# Patient Record
Sex: Male | Born: 1935
Health system: Southern US, Community
[De-identification: ages and names within clinical notes are randomized; demographics above are authoritative.]

## PROBLEM LIST (undated history)

## (undated) DIAGNOSIS — M1990 Unspecified osteoarthritis, unspecified site: Secondary | ICD-10-CM

## (undated) DIAGNOSIS — F32A Depression, unspecified: Secondary | ICD-10-CM

## (undated) DIAGNOSIS — C801 Malignant (primary) neoplasm, unspecified: Secondary | ICD-10-CM

## (undated) DIAGNOSIS — J189 Pneumonia, unspecified organism: Secondary | ICD-10-CM

## (undated) DIAGNOSIS — E785 Hyperlipidemia, unspecified: Secondary | ICD-10-CM

## (undated) DIAGNOSIS — R7303 Prediabetes: Secondary | ICD-10-CM

## (undated) DIAGNOSIS — D649 Anemia, unspecified: Secondary | ICD-10-CM

## (undated) DIAGNOSIS — F419 Anxiety disorder, unspecified: Secondary | ICD-10-CM

## (undated) HISTORY — PX: SHOULDER SURGERY: SHX246

## (undated) HISTORY — PX: COLONOSCOPY: SHX174

## (undated) HISTORY — PX: WISDOM TOOTH EXTRACTION: SHX21

## (undated) HISTORY — PX: ELBOW SURGERY: SHX618

## (undated) HISTORY — PX: EYE SURGERY: SHX253

---

## 1999-09-20 ENCOUNTER — Emergency Department (HOSPITAL_COMMUNITY): Admission: EM | Admit: 1999-09-20 | Discharge: 1999-09-20 | Payer: Self-pay | Admitting: *Deleted

## 2001-04-03 ENCOUNTER — Ambulatory Visit (HOSPITAL_BASED_OUTPATIENT_CLINIC_OR_DEPARTMENT_OTHER): Admission: RE | Admit: 2001-04-03 | Discharge: 2001-04-03 | Payer: Self-pay | Admitting: Orthopedic Surgery

## 2002-06-17 ENCOUNTER — Encounter: Admission: RE | Admit: 2002-06-17 | Discharge: 2002-06-17 | Payer: Self-pay | Admitting: Orthopaedic Surgery

## 2002-06-17 ENCOUNTER — Encounter: Payer: Self-pay | Admitting: Orthopaedic Surgery

## 2003-05-21 ENCOUNTER — Ambulatory Visit (HOSPITAL_COMMUNITY): Admission: RE | Admit: 2003-05-21 | Discharge: 2003-05-21 | Payer: Self-pay | Admitting: *Deleted

## 2003-12-10 ENCOUNTER — Ambulatory Visit (HOSPITAL_COMMUNITY): Admission: RE | Admit: 2003-12-10 | Discharge: 2003-12-10 | Payer: Self-pay | Admitting: Family Medicine

## 2004-04-01 ENCOUNTER — Encounter: Admission: RE | Admit: 2004-04-01 | Discharge: 2004-06-30 | Payer: Self-pay | Admitting: Family Medicine

## 2004-09-12 ENCOUNTER — Emergency Department (HOSPITAL_COMMUNITY): Admission: EM | Admit: 2004-09-12 | Discharge: 2004-09-12 | Payer: Self-pay | Admitting: Emergency Medicine

## 2005-07-11 ENCOUNTER — Encounter: Admission: RE | Admit: 2005-07-11 | Discharge: 2005-07-11 | Payer: Self-pay | Admitting: Family Medicine

## 2005-07-15 ENCOUNTER — Encounter: Admission: RE | Admit: 2005-07-15 | Discharge: 2005-07-15 | Payer: Self-pay | Admitting: Family Medicine

## 2005-07-18 ENCOUNTER — Encounter: Admission: RE | Admit: 2005-07-18 | Discharge: 2005-07-18 | Payer: Self-pay | Admitting: Family Medicine

## 2007-05-08 IMAGING — CR DG THORACIC SPINE 3V
3 series · 3 of 3 positions shown · non-contrast
Comparison: none

CLINICAL DATA: Pain.
 CHEST, TWO VIEWS:
 The lungs are clear.  The heart and mediastinal structures have a normal appearance with diffuse mid and lower thoracic osteophytic changes.

[t t-spine a.p.]
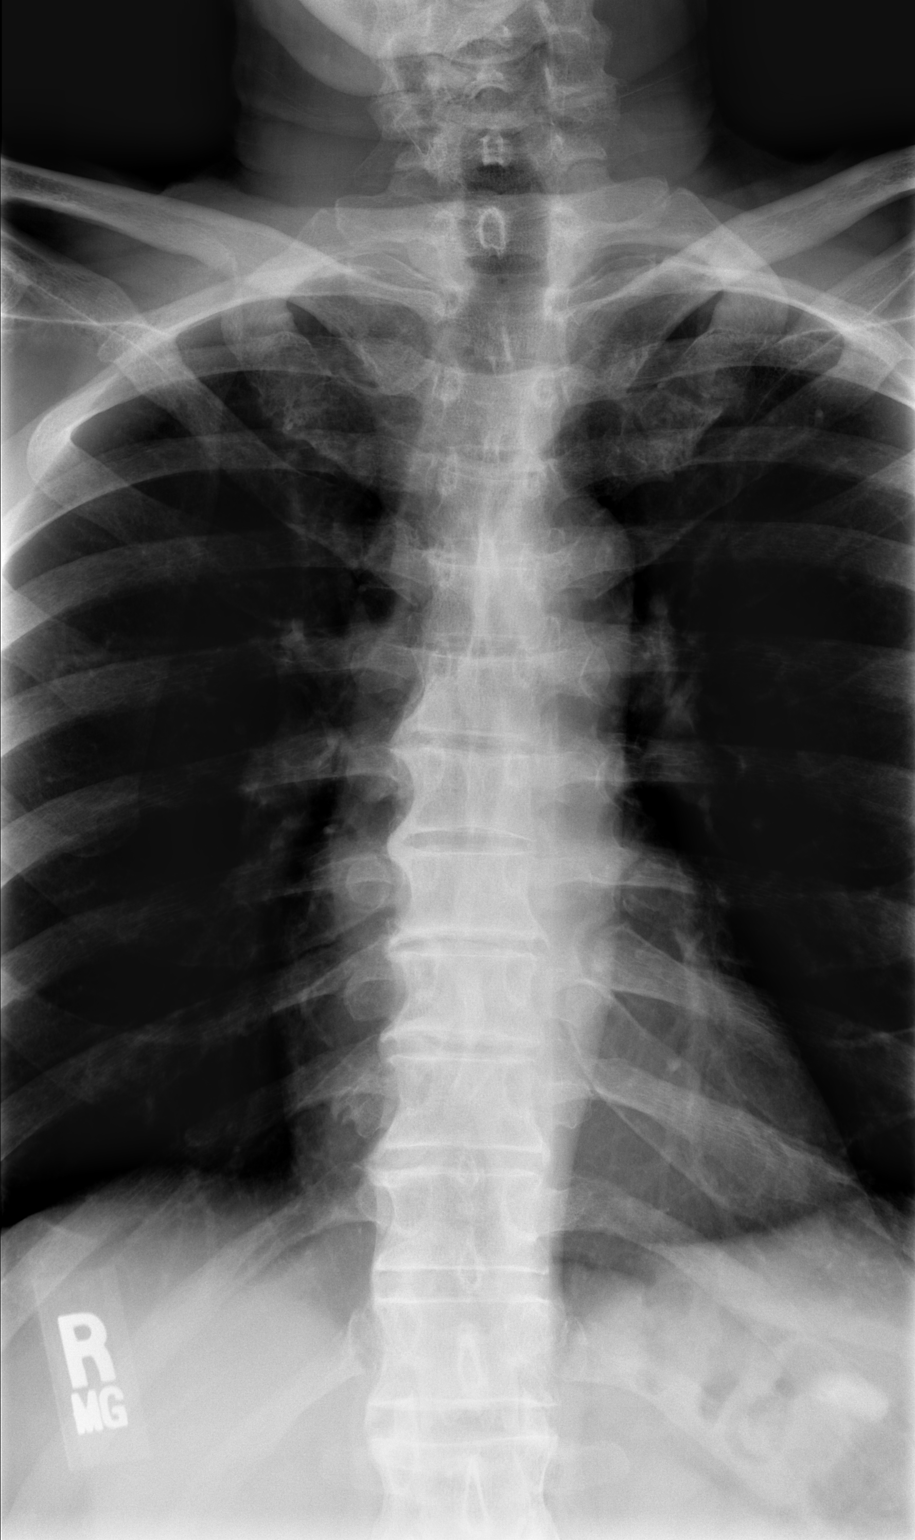

[t t-spine lat]
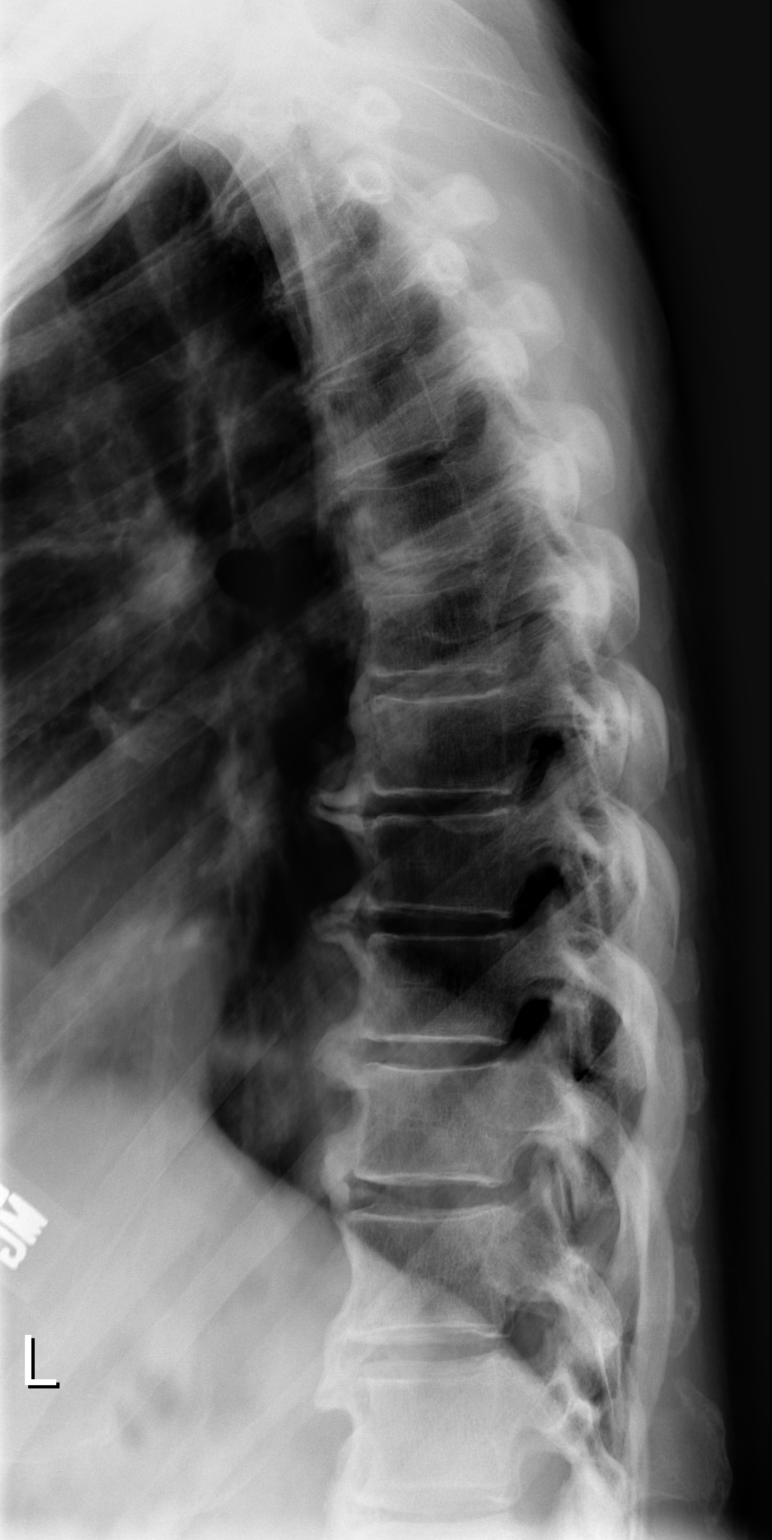

[t swimmers]
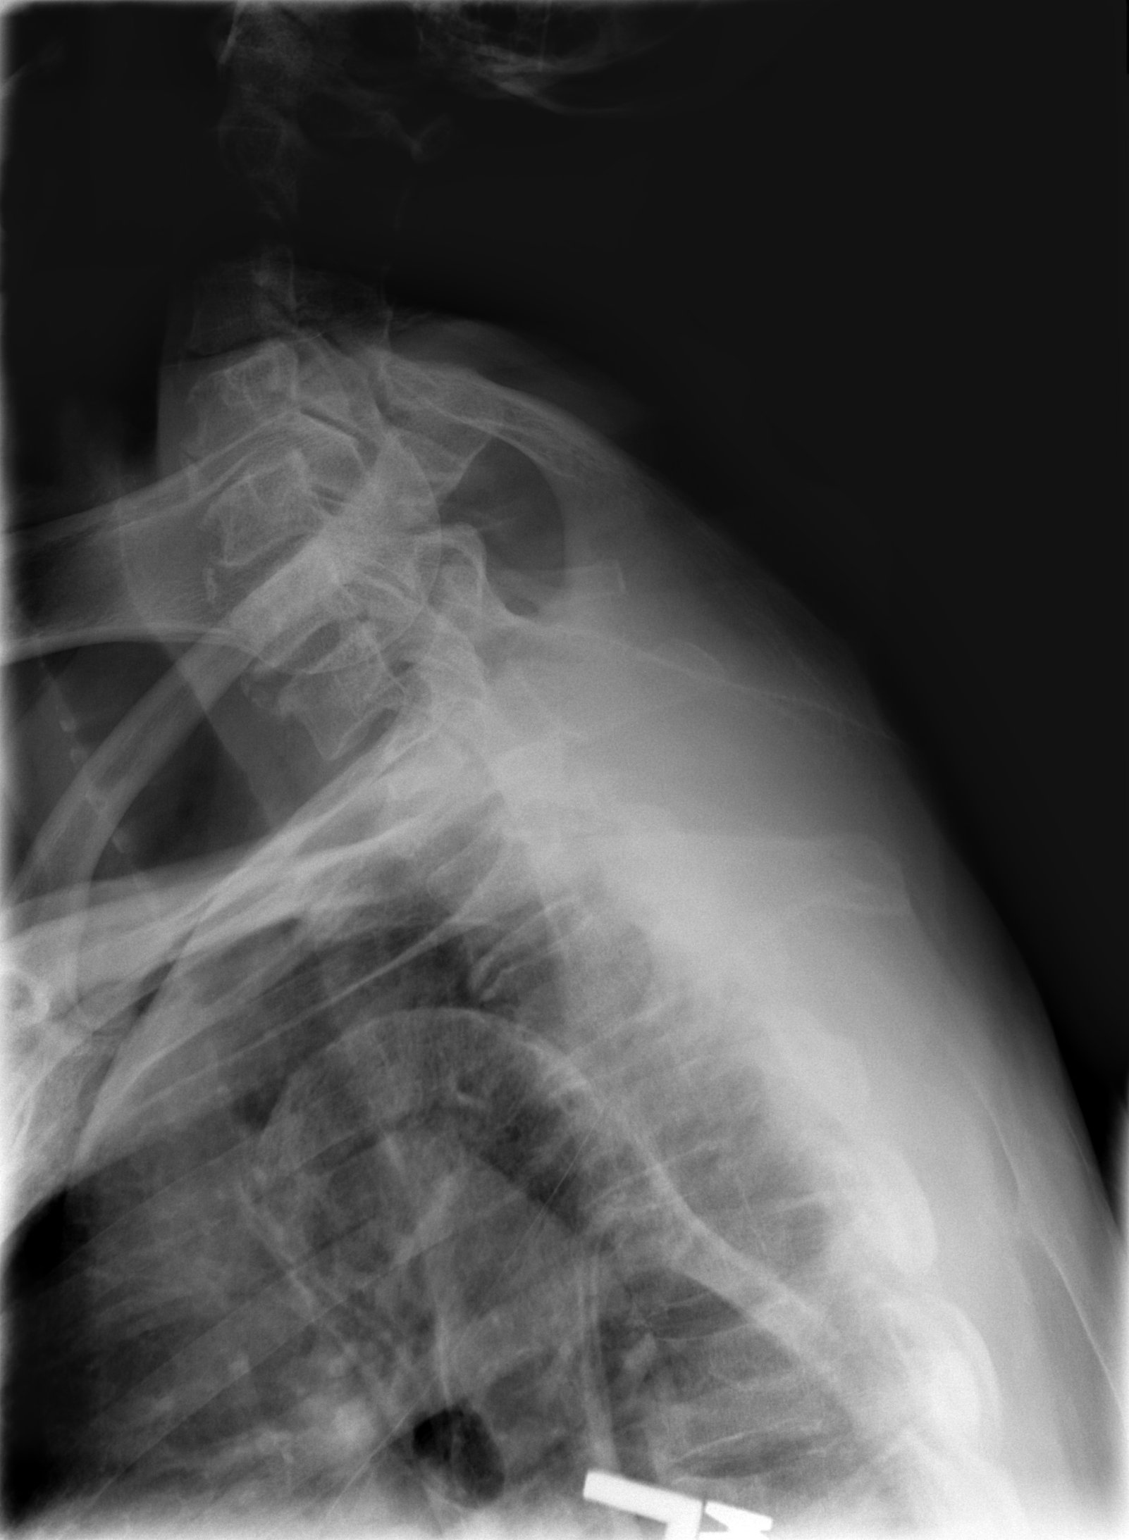

[3 of 3 positions shown; findings below may reference images not displayed]

IMPRESSION: Thoracic osteophytic changes.  No evidence for active chest disease radiographically.
 RIGHT RIBS, TWO VIEWS:
 There are thoracic osteophytic changes present.  There are no fractures or lytic lesions. The bones appear intrinsically normal.  There is a radiopaque density within the left upper quadrant.  This may represent ingested radiopaque material.
IMPRESSION: Normal views of right ribs.  Radiopaque density, left upper quadrant, which may represent ingested radiopaque tablet/material.  
 THORACIC SPINE, THREE VIEWS:
 There are osteophytic changes noted with in the mid and lower thoracic spine.   There no fractures, subluxations, or destructive changes. The paraspinal soft tissues have normal appearance.
IMPRESSION: Thoracic osteophytosis.  Otherwise normal study.

## 2014-06-04 ENCOUNTER — Ambulatory Visit: Payer: Self-pay | Admitting: *Deleted

## 2014-06-07 ENCOUNTER — Encounter (HOSPITAL_COMMUNITY): Payer: Self-pay | Admitting: Emergency Medicine

## 2014-06-07 ENCOUNTER — Ambulatory Visit (HOSPITAL_COMMUNITY)
Admission: AD | Admit: 2014-06-07 | Discharge: 2014-06-07 | Disposition: A | Discharge status: Home / Self Care | Payer: No Typology Code available for payment source | Attending: Psychiatry | Admitting: Psychiatry

## 2014-06-07 ENCOUNTER — Emergency Department (HOSPITAL_COMMUNITY)
Admission: EM | Admit: 2014-06-07 | Discharge: 2014-06-07 | Disposition: A | Discharge status: Home / Self Care | Payer: Medicare Other | Attending: Emergency Medicine | Admitting: Emergency Medicine

## 2014-06-07 DIAGNOSIS — F329 Major depressive disorder, single episode, unspecified: Secondary | ICD-10-CM | POA: Diagnosis not present

## 2014-06-07 DIAGNOSIS — F411 Generalized anxiety disorder: Secondary | ICD-10-CM | POA: Diagnosis not present

## 2014-06-07 DIAGNOSIS — F419 Anxiety disorder, unspecified: Secondary | ICD-10-CM | POA: Diagnosis not present

## 2014-06-07 DIAGNOSIS — F4321 Adjustment disorder with depressed mood: Secondary | ICD-10-CM | POA: Diagnosis present

## 2014-06-07 DIAGNOSIS — G4700 Insomnia, unspecified: Secondary | ICD-10-CM

## 2014-06-07 MED ORDER — AMITRIPTYLINE HCL 50 MG PO TABS: 50.0000 mg | ORAL_TABLET | Freq: Every day | ORAL | Status: DC

## 2014-06-07 MED ORDER — CLONAZEPAM 0.5 MG PO TABS: 0.5000 mg | ORAL_TABLET | Freq: Three times a day (TID) | ORAL | Status: DC | PRN

## 2014-06-07 MED ORDER — AMITRIPTYLINE HCL 25 MG PO TABS: 50.0000 mg | ORAL_TABLET | Freq: Every day | ORAL | Status: DC

## 2014-06-07 NOTE — Consult Note (Signed)
[facility]   Reason for Consult:  Anxiety Referring Physician:  EDP Patient Identification: Jeremy Holmes MRN:  [id number] Principal Diagnosis: Generalized anxiety disorder Diagnosis:   Patient Active Problem List   Diagnosis Date Noted  . Generalized anxiety disorder [F41.1] 06/07/2014    Priority: High  . Adjustment disorder with depressed mood [F43.21] 06/07/2014    Priority: High  Review of Systems  Constitutional: Negative.   HENT: Negative.   Eyes: Negative.   Respiratory: Negative.   Cardiovascular: Negative.   Gastrointestinal: Negative.   Genitourinary: Negative.   Musculoskeletal: Negative.   Skin: Negative.   Neurological: Negative.   Endo/Heme/Allergies: Negative.   Psychiatric/Behavioral: The patient is nervous/anxious.    HPI:  The patient has had a stressful few months with his grandson and his drug issues.  His anxiety started and increased with lack of sleep which has decreased to an hour a night.  Mr. Jeremy Holmes has lost weight with a decrease in appetite.  His regular MD gave him Xanax 0.5 mg daily for ten days in February, doubled the dose on 2/19, and started Zoloft on 2/24 but he stopped after a week due to "bad thoughts, very dark thoughts".  Last night, he was prescribed Temazepam which made things worse.  [facility] lack of sleep and anxiety, transferred to the ED.  His wife is supportive and at his bedside.  Mr. Jeremy Holmes has been going to a counselor at [facility].  He has an appointment with the NP this Wednesday.  Psychiatrist Rx Klonopin 0.5 mg TID for anxiety (1 week Rx) and Amitriptyline 50 mg at bedtime for sleep.  Denies suicidal/homicidal ideations, hallucinations, and alcohol/drug issues.  Stable for discharge.  Total Time spent with patient: 45 minutes  Subjective:   Jeremy Holmes is a 81 y.o. male patient does not warrant admission.  HPI:  The patient HPI Elements:   Location:   generalized. Quality:  acute. Severity:  mild. Timing:  constant. Duration:  month ago. Context:  stressors.  Past Medical History: History reviewed. No pertinent past medical history. History reviewed. No pertinent past surgical history. Family History: History reviewed. No pertinent family history. Social History:  History  Alcohol Use No     History  Drug Use No    History   Social History  . Marital Status: Married    Spouse Name: N/A  . Number of Children: N/A  . Years of Education: N/A   Social History Main Topics  . Smoking status: Never Smoker   . Smokeless tobacco: Not on file  . Alcohol Use: No  . Drug Use: No  . Sexual Activity: Not on file   Other Topics Concern  . None   Social History Narrative  . None   Additional Social History:                          Allergies:  No Known Allergies  Vitals: Blood pressure 160/59, pulse 66, temperature 98.1 F (36.7 C), temperature source Oral, resp. rate 17, SpO2 96 %.  Risk to Self:   Risk to Others:   Prior Inpatient Therapy:   Prior Outpatient Therapy:    Current Facility-Administered Medications  Medication Dose Route Frequency Provider Last Rate Last Dose  . amitriptyline (ELAVIL) tablet 50 mg  50 mg Oral QHS Jeremy RingsJamison Y Mafalda Mcginniss, NP       Current Outpatient Prescriptions  Medication Sig  Dispense Refill  . amitriptyline (ELAVIL) 50 MG tablet Take 1 tablet (50 mg total) by mouth at bedtime. 30 tablet 0  . clonazePAM (KLONOPIN) 0.5 MG tablet Take 1 tablet (0.5 mg total) by mouth 3 (three) times daily as needed for anxiety. 21 tablet 0    Musculoskeletal: Strength & Muscle Tone: within normal limits Gait & Station: normal Patient leans: N/A  Psychiatric Specialty Exam:     Blood pressure 160/59, pulse 66, temperature 98.1 F (36.7 C), temperature source Oral, resp. rate 17, SpO2 96 %.There is no height or weight on file to calculate BMI.  General Appearance: Casual  Eye Contact::  Good   Speech:  Normal Rate  Volume:  Normal  Mood:  Anxious  Affect:  Congruent  Thought Process:  Coherent  Orientation:  Full (Time, Place, and Person)  Thought Content:  WDL  Suicidal Thoughts:  No  Homicidal Thoughts:  No  Memory:  Immediate;   Good Recent;   Good Remote;   Good  Judgement:  Good  Insight:  Good  Psychomotor Activity:  Normal  Concentration:  Good  Recall:  Good  Fund of Knowledge:Good  Language: Good  Akathisia:  No  Handed:  Right  AIMS (if indicated):     Assets:  Communication Skills Desire for Improvement Financial Resources/Insurance Housing Intimacy Leisure Time Physical Health Resilience Social Support Talents/Skills Transportation  ADL's:  Intact  Cognition: WNL  Sleep:      Medical Decision Making: Review of Psycho-Social Stressors (1), Review or order clinical lab tests (1) and Review of Medication Regimen & Side Effects (2)  Treatment Plan Summary: Daily contact with patient to assess and evaluate symptoms and progress in treatment, Medication management and Plan discharge, appointment Wednesday for out-patient, Rx given for anxiety and sleep  Plan:  No evidence of imminent risk to self or others at present.   Disposition: Discharge, appointment Wednesday for out-patient, Rx given for anxiety and sleep  Jeremy Holmes, PMH-NP 06/07/2014 11:29 AM

## 2014-06-07 NOTE — ED Provider Notes (Signed)
CSN: [id number]     Arrival date & time 06/07/14  [id number] History   First MD Initiated Contact with Patient 06/07/14 [phone]     Chief Complaint  Patient presents with  . Anxiety     (Consider location/radiation/quality/duration/timing/severity/associated sxs/prior Treatment) HPI Comments: Here for anxiety. About one month ago had some stressful family events where his grandson was in a rage due to drug use. Patient was unable to help after going to his grandson's house. He felt a lot of guilt and got establishing him. He's been unable to sleep since then because he has been bothered by these events. Patient was given some sertraline by his doctor about 3 weeks ago. While he was taking it, he was having some suicidal thoughts. He stopped it after 3 days because of the suicidal thoughts. He's had no other suicidal thoughts since then. He is not suicidal today. He is having a lot of difficulty sleeping. He was taking some Xanax as needed and then was prescribed yesterday 3 times a day Xanax for anxiety. He is mainly concerned because he's been unable to sleep. He's had no relief with melatonin or temazepam.  Patient is a 81 y.o. male presenting with anxiety. The history is provided by the patient.  Anxiety This is a new problem. The current episode started more than 1 week ago. The problem occurs constantly. The problem has not changed since onset.Pertinent negatives include no chest pain and no shortness of breath. Nothing aggravates the symptoms. Relieved by: Low-dose benzos.    History reviewed. No pertinent past medical history. History reviewed. No pertinent past surgical history. History reviewed. No pertinent family history. History  Substance Use Topics  . Smoking status: Never Smoker   . Smokeless tobacco: Not on file  . Alcohol Use: No    Review of Systems  Constitutional: Negative for fever and chills.  Respiratory: Negative for shortness of breath.   Cardiovascular: Negative for  chest pain.  Psychiatric/Behavioral: Positive for sleep disturbance. Negative for self-injury.  All other systems reviewed and are negative.     Allergies  Review of patient's allergies indicates no known allergies.  Home Medications   Prior to Admission medications   Not on File   BP 160/59 mmHg  Pulse 66  Temp(Src) 98.1 F (36.7 C) (Oral)  Resp 17  SpO2 96% Physical Exam  Constitutional: He is oriented to person, place, and time. He appears well-developed and well-nourished. No distress.  HENT:  Head: Normocephalic and atraumatic.  Mouth/Throat: No oropharyngeal exudate.  Eyes: EOM are normal. Pupils are equal, round, and reactive to light.  Neck: Normal range of motion. Neck supple.  Cardiovascular: Normal rate and regular rhythm.  Exam reveals no friction rub.   No murmur heard. Pulmonary/Chest: Effort normal and breath sounds normal. No respiratory distress. He has no wheezes. He has no rales.  Abdominal: He exhibits no distension. There is no tenderness. There is no rebound.  Musculoskeletal: Normal range of motion. He exhibits no edema.  Neurological: He is alert and oriented to person, place, and time.  Skin: He is not diaphoretic.  Nursing note and vitals reviewed.   ED Course  Procedures (including critical care time) Labs Review Labs Reviewed - No data to display  Imaging Review No results found.   EKG Interpretation None      MDM   Final diagnoses:  Anxiety  Insomnia    81 year old male here with anxiety and and insomnia. I feel he is having depression because of  his feelings of guilt and insomnia. He is not suicidal. He has not seen our talk to psychiatrist. He is not suicidal or homicidal today. Patient is visibly anxious on exam. He is mostly concerned about his insomnia. I counseled him on Benadryl use. He would really like to speak to psychiatrist. Last counseled him that he does not meet inpatient criteria, however I will consult psych to  see if there are any options they can help him with and for possible outpatient follow-up. Psych consulted on patient, prescribed some further medications. Stable for discharge.    Elwin Mocha, MD 06/08/14 [phone]

## 2014-06-07 NOTE — [facility] Suicide Risk Assessment (Signed)
Suicide Risk Assessment  Discharge Assessment   [facility]   Demographic Factors:  Male, Age 81 or older and Caucasian  Total Time spent with patient: 45 minutes  Musculoskeletal: Strength & Muscle Tone: within normal limits Gait & Station: normal Patient leans: N/A  Psychiatric Specialty Exam:     Blood pressure 160/59, pulse 66, temperature 98.1 F (36.7 C), temperature source Oral, resp. rate 17, SpO2 96 %.There is no height or weight on file to calculate BMI.  General Appearance: Casual  Eye Contact::  Good  Speech:  Normal Rate  Volume:  Normal  Mood:  Anxious  Affect:  Congruent  Thought Process:  Coherent  Orientation:  Full (Time, Place, and Person)  Thought Content:  WDL  Suicidal Thoughts:  No  Homicidal Thoughts:  No  Memory:  Immediate;   Good Recent;   Good Remote;   Good  Judgement:  Good  Insight:  Good  Psychomotor Activity:  Normal  Concentration:  Good  Recall:  Good  Fund of Knowledge:Good  Language: Good  Akathisia:  No  Handed:  Right  AIMS (if indicated):     Assets:  Communication Skills Desire for Improvement Financial Resources/Insurance Housing Intimacy Leisure Time Physical Health Resilience Social Support Talents/Skills Transportation  ADL's:  Intact  Cognition: WNL  Sleep:         Has this patient used any form of tobacco in the last 30 days? (Cigarettes, Smokeless Tobacco, Cigars, and/or Pipes) No  Mental Status Per Nursing Assessment::   On Admission:   Anxious, insomnia  Current Mental Status by Physician: NA  Loss Factors: NA  Historical Factors: NA  Risk Reduction Factors:   Sense of responsibility to family, Religious beliefs about death, Living with another person, especially a relative, Positive social support, Positive therapeutic relationship and Positive coping skills or problem solving skills  Continued Clinical Symptoms:  Anxiety  Cognitive Features That Contribute To  Risk:  None    Suicide Risk:  Minimal: No identifiable suicidal ideation.  Patients presenting with no risk factors but with morbid ruminations; may be classified as minimal risk based on the severity of the depressive symptoms  Principal Problem: Generalized anxiety disorder Discharge Diagnoses:  Patient Active Problem List   Diagnosis Date Noted  . Generalized anxiety disorder [F41.1] 06/07/2014    Priority: High  . Adjustment disorder with depressed mood [F43.21] 06/07/2014    Priority: High      Plan Of Care/Follow-up recommendations:  Activity:  as tolerated Diet:  heart healthy diet  Is patient on multiple antipsychotic therapies at discharge:  No   Has Patient had three or more failed trials of antipsychotic monotherapy by history:  No  Recommended Plan for Multiple Antipsychotic Therapies: NA    LORD, JAMISON, PMH-NP 06/07/2014, 12:08 PM

## 2014-06-07 NOTE — ED Notes (Signed)
Pt reports having problems with anxiety after a stressful family situation.  Went to his PCP who prescribed him xanax XR.  States that he was having trouble with insomnia, loss of appetite, depression.  A week later was put on sertraline.  After 3 doses of sertraline, pt began having suicidal thoughts.  Reports intermittent suicidal thoughts but denies them today.  Pt states that he wanted to get off these meds so his PCP put him on fast acting xanax and temazepam.  States that he does not feel any better.  Feels more anxious.  Reports last suicidal thoughts were in February.  Denies HI.  Pt states that his grandson is on drugs and caused him this stress.  States he feels helpless in this situation.  Feels like God is punishing him.

## 2014-06-07 NOTE — Discharge Instructions (Signed)
Insomnia Insomnia is frequent trouble falling and/or staying asleep. Insomnia can be a long term problem or a short term problem. Both are common. Insomnia can be a short term problem when the wakefulness is related to a certain stress or worry. Long term insomnia is often related to ongoing stress during waking hours and/or poor sleeping habits. Overtime, sleep deprivation itself can make the problem worse. Every little thing feels more severe because you are overtired and your ability to cope is decreased. CAUSES   Stress, anxiety, and depression.  Poor sleeping habits.  Distractions such as TV in the bedroom.  Naps close to bedtime.  Engaging in emotionally charged conversations before bed.  Technical reading before sleep.  Alcohol and other sedatives. They may make the problem worse. They can hurt normal sleep patterns and normal dream activity.  Stimulants such as caffeine for several hours prior to bedtime.  Pain syndromes and shortness of breath can cause insomnia.  Exercise late at night.  Changing time zones may cause sleeping problems (jet lag). It is sometimes helpful to have someone observe your sleeping patterns. They should look for periods of not breathing during the night (sleep apnea). They should also look to see how long those periods last. If you live alone or observers are uncertain, you can also be observed at a sleep clinic where your sleep patterns will be professionally monitored. Sleep apnea requires a checkup and treatment. Give your caregivers your medical history. Give your caregivers observations your family has made about your sleep.  SYMPTOMS   Not feeling rested in the morning.  Anxiety and restlessness at bedtime.  Difficulty falling and staying asleep. TREATMENT   Your caregiver may prescribe treatment for an underlying medical disorders. Your caregiver can give advice or help if you are using alcohol or other drugs for self-medication. Treatment  of underlying problems will usually eliminate insomnia problems.  Medications can be prescribed for short time use. They are generally not recommended for lengthy use.  Over-the-counter sleep medicines are not recommended for lengthy use. They can be habit forming.  You can promote easier sleeping by making lifestyle changes such as:  Using relaxation techniques that help with breathing and reduce muscle tension.  Exercising earlier in the day.  Changing your diet and the time of your last meal. No night time snacks.  Establish a regular time to go to bed.  Counseling can help with stressful problems and worry.  Soothing music and white noise may be helpful if there are background noises you cannot remove.  Stop tedious detailed work at least one hour before bedtime. HOME CARE INSTRUCTIONS   Keep a diary. Inform your caregiver about your progress. This includes any medication side effects. See your caregiver regularly. Take note of:  Times when you are asleep.  Times when you are awake during the night.  The quality of your sleep.  How you feel the next day. This information will help your caregiver care for you.  Get out of bed if you are still awake after 15 minutes. Read or do some quiet activity. Keep the lights down. Wait until you feel sleepy and go back to bed.  Keep regular sleeping and waking hours. Avoid naps.  Exercise regularly.  Avoid distractions at bedtime. Distractions include watching television or engaging in any intense or detailed activity like attempting to balance the household checkbook.  Develop a bedtime ritual. Keep a familiar routine of bathing, brushing your teeth, climbing into bed at the same   time each night, listening to soothing music. Routines increase the success of falling to sleep faster.  Use relaxation techniques. This can be using breathing and muscle tension release routines. It can also include visualizing peaceful scenes. You can  also help control troubling or intruding thoughts by keeping your mind occupied with boring or repetitive thoughts like the old concept of counting sheep. You can make it more creative like imagining planting one beautiful flower after another in your backyard garden.  During your day, work to eliminate stress. When this is not possible use some of the previous suggestions to help reduce the anxiety that accompanies stressful situations. MAKE SURE YOU:   Understand these instructions.  Will watch your condition.  Will get help right away if you are not doing well or get worse. Document Released: 03/11/2000 Document Revised: 06/06/2011 Document Reviewed: 04/11/2007 Exit[employer] Patient Information 2015 [employer]. This information is not intended to replace advice given to you by your health care provider. Make sure you discuss any questions you have with your health care provider.  

## 2014-06-07 NOTE — BH Assessment (Addendum)
Tele Assessment Note   Jeremy Holmes is an 81 y.o. male who came as a walk in to [facility] wanting to come off of Xanax, but saying he is unable to sleep.  Pt also has off and on SI with no plan or intent or mental health history.  Pt became anxious about a month ago when his grandson was going through some addiction problems and he was trying to help. He was unable to sleep, so his GP prescribed  Xanax, and he was still unable to sleep. Gp also prescribed Zoloft, which pt only took for 3 days because he was having "dark thoughts".  Pt was initially taking one .5 mg of Xanax 2x/day, and then yesterday, the GP took him down to one tab of .25 3x/day, and prescribed Temazepam for sleep. Pt said he did not sleep at all last night, and he feels like he is "going crazy when he can't sleep".  He denies HI, A/V hallucinations or history of violence.  He endorses symptoms of guilt re: his grandson's SA problems, and says he worries about him.  He reports that he believes his brother was an alcoholic and committed suicide in his 55's due to financial problems.  Pt lives with his wife who is very supportive, but who pt says is "very anti-medications".   Pt was calm and cooperative during assessment with depressed mood and anxious affect. He had normal movement, speech and thought process and there was no evidence of responding to internal stimuli.  He was dressed casually and well groomed.   Nanine Means recommends pt be transferred to [facility] to be seen by psychiatry as they round to determine final disposition.  Axis I: Anxiety Disorder NOS and Depressive Disorder NOS Axis II: Deferred Axis III: No past medical history on file. Axis IV: other psychosocial or environmental problems Axis V: 41-50 serious symptoms  Past Medical History: No past medical history on file.  No past surgical history on file.  Family History: No family history on file.  Social History:  has no tobacco, alcohol, and drug history on  file.  Additional Social History:  Alcohol / Drug Use Pain Medications: denies Prescriptions: denies Over the Counter: denies History of alcohol / drug use?: No history of alcohol / drug abuse Longest period of sobriety (when/how long):  (denies) Negative Consequences of Use:  (denies)  CIWA:   COWS:    PATIENT STRENGTHS: (choose at least two) Ability for insight Active sense of humor Average or above average intelligence Capable of independent living Communication skills Financial means General fund of knowledge Motivation for treatment/growth Physical Health Special hobby/interest Supportive family/friends Work skills  Allergies: Allergies not on file  Home Medications:  (Not in a hospital admission)  OB/GYN Status:  No LMP for male patient.  General Assessment Data Location of Assessment: [facility] Is this a Tele or Face-to-Face Assessment?: Face-to-Face Is this an Initial Assessment or a Re-assessment for this encounter?: Initial Assessment Living Arrangements: Spouse/significant other Can pt return to current living arrangement?: Yes Admission Status: Voluntary Is patient capable of signing voluntary admission?: Yes Transfer from: Home Referral Source: Self/Family/Friend  Medical Screening Exam [facility] Walk-in ONLY) Medical Exam completed: No Reason for MSE not completed:  (going to [facility] for psych rounding)  [facility] Crisis Care Plan Living Arrangements: Spouse/significant other Name of Psychiatrist:  (appt with Sherin Quarry, NP at [facility] Wednesday) Name of Therapist:  [facility]  Education Status Is patient currently in school?:  No  Risk to self with the past 6 months Suicidal Ideation: Yes-Currently Present (off and on) Suicidal Intent: No Is patient at risk for suicide?: No Suicidal Plan?: No Access to Means: No What has been your use of drugs/alcohol within the last 12 months?:  (denies) Previous  Attempts/Gestures: No Other Self Harm Risks:  (none known) Family Suicide History:  (brother committed suicide) Recent stressful life event(s):  (stress from grandson's addiction) Persecutory voices/beliefs?: No Depression: Yes Depression Symptoms: Insomnia, Fatigue, Guilt, Loss of interest in usual pleasures Substance abuse history and/or treatment for substance abuse?: No Suicide prevention information given to non-admitted patients: Not applicable  Risk to Others within the past 6 months Homicidal Ideation: No Thoughts of Harm to Others: No Current Homicidal Intent: No Current Homicidal Plan: No Access to Homicidal Means: No History of harm to others?: No Assessment of Violence: None Noted Does patient have access to weapons?: No Criminal Charges Pending?: No Does patient have a court date: No  Psychosis Hallucinations: None noted Delusions: None noted  Mental Status Report Appear/Hygiene: Improved Eye Contact: Good Motor Activity: Restlessness Speech: Logical/coherent Level of Consciousness: Alert Mood: Anxious, Depressed Affect: Anxious, Depressed Anxiety Level: Severe Thought Processes: Coherent, Relevant Judgement: Unimpaired Orientation: Person, Place, Time, Situation, Appropriate for developmental age Obsessive Compulsive Thoughts/Behaviors: None  Cognitive Functioning Concentration: Decreased Memory: Recent Intact, Remote Intact IQ: Above Average Insight: Good Impulse Control: Good Appetite: Fair Weight Loss: 10 Weight Gain: 0 Sleep: Decreased Total Hours of Sleep: 2 Vegetative Symptoms: None  ADLScreening [facility]) Patient's cognitive ability adequate to safely complete daily activities?: Yes Patient able to express need for assistance with ADLs?: Yes Independently performs ADLs?: Yes (appropriate for developmental age)  Prior Inpatient Therapy Prior Inpatient Therapy: No  Prior Outpatient Therapy Prior Outpatient Therapy:  Yes Prior Therapy Dates:  (past 2 weeks) Prior Therapy Facilty/Provider(s):  [facility]) Reason for Treatment:  (anxiety)  ADL Screening (condition at time of admission) Patient's cognitive ability adequate to safely complete daily activities?: Yes Is the patient deaf or have difficulty hearing?: No Does the patient have difficulty seeing, even when wearing glasses/contacts?: No Does the patient have difficulty concentrating, remembering, or making decisions?: No Patient able to express need for assistance with ADLs?: Yes Does the patient have difficulty dressing or bathing?: No Independently performs ADLs?: Yes (appropriate for developmental age) Does the patient have difficulty walking or climbing stairs?: No  Home Assistive Devices/Equipment Home Assistive Devices/Equipment: None    Abuse/Neglect Assessment (Assessment to be complete while patient is alone) Physical Abuse: Denies Verbal Abuse: Denies Sexual Abuse: Denies Exploitation of patient/patient's resources: Denies Self-Neglect: Denies          Additional Information 1:1 In Past 12 Months?: No CIRT Risk: No Elopement Risk: No Does patient have medical clearance?: No     Disposition:  Disposition Initial Assessment Completed for this Encounter: Yes Disposition of Patient: Other dispositions (transport to )  [facility],Jeremy Holmes 06/07/2014 8:48 AM

## 2015-05-14 DIAGNOSIS — H26491 Other secondary cataract, right eye: Secondary | ICD-10-CM | POA: Diagnosis not present

## 2015-05-14 DIAGNOSIS — H35372 Puckering of macula, left eye: Secondary | ICD-10-CM | POA: Diagnosis not present

## 2015-05-14 DIAGNOSIS — H43811 Vitreous degeneration, right eye: Secondary | ICD-10-CM | POA: Diagnosis not present

## 2015-05-14 DIAGNOSIS — H35371 Puckering of macula, right eye: Secondary | ICD-10-CM | POA: Diagnosis not present

## 2015-05-27 DIAGNOSIS — H35372 Puckering of macula, left eye: Secondary | ICD-10-CM | POA: Diagnosis not present

## 2015-05-27 DIAGNOSIS — H547 Unspecified visual loss: Secondary | ICD-10-CM | POA: Diagnosis not present

## 2015-06-04 DIAGNOSIS — Z09 Encounter for follow-up examination after completed treatment for conditions other than malignant neoplasm: Secondary | ICD-10-CM | POA: Diagnosis not present

## 2015-06-04 DIAGNOSIS — H35372 Puckering of macula, left eye: Secondary | ICD-10-CM | POA: Diagnosis not present

## 2015-06-04 DIAGNOSIS — H35371 Puckering of macula, right eye: Secondary | ICD-10-CM | POA: Diagnosis not present

## 2015-06-25 DIAGNOSIS — Z09 Encounter for follow-up examination after completed treatment for conditions other than malignant neoplasm: Secondary | ICD-10-CM | POA: Diagnosis not present

## 2015-06-25 DIAGNOSIS — H35372 Puckering of macula, left eye: Secondary | ICD-10-CM | POA: Diagnosis not present

## 2015-06-25 DIAGNOSIS — H35352 Cystoid macular degeneration, left eye: Secondary | ICD-10-CM | POA: Diagnosis not present

## 2015-07-16 DIAGNOSIS — H59032 Cystoid macular edema following cataract surgery, left eye: Secondary | ICD-10-CM | POA: Diagnosis not present

## 2015-07-16 DIAGNOSIS — H35352 Cystoid macular degeneration, left eye: Secondary | ICD-10-CM | POA: Diagnosis not present

## 2015-08-27 DIAGNOSIS — H59032 Cystoid macular edema following cataract surgery, left eye: Secondary | ICD-10-CM | POA: Diagnosis not present

## 2015-08-27 DIAGNOSIS — H35352 Cystoid macular degeneration, left eye: Secondary | ICD-10-CM | POA: Diagnosis not present

## 2015-10-07 DIAGNOSIS — H35352 Cystoid macular degeneration, left eye: Secondary | ICD-10-CM | POA: Diagnosis not present

## 2015-10-07 DIAGNOSIS — H26491 Other secondary cataract, right eye: Secondary | ICD-10-CM | POA: Diagnosis not present

## 2015-10-07 DIAGNOSIS — H59032 Cystoid macular edema following cataract surgery, left eye: Secondary | ICD-10-CM | POA: Diagnosis not present

## 2015-10-07 DIAGNOSIS — H35371 Puckering of macula, right eye: Secondary | ICD-10-CM | POA: Diagnosis not present

## 2015-11-10 DIAGNOSIS — E785 Hyperlipidemia, unspecified: Secondary | ICD-10-CM | POA: Diagnosis not present

## 2015-11-10 DIAGNOSIS — R739 Hyperglycemia, unspecified: Secondary | ICD-10-CM | POA: Diagnosis not present

## 2015-11-10 DIAGNOSIS — Z79899 Other long term (current) drug therapy: Secondary | ICD-10-CM | POA: Diagnosis not present

## 2015-11-10 DIAGNOSIS — K5900 Constipation, unspecified: Secondary | ICD-10-CM | POA: Diagnosis not present

## 2015-12-25 DIAGNOSIS — Z23 Encounter for immunization: Secondary | ICD-10-CM | POA: Diagnosis not present

## 2015-12-25 DIAGNOSIS — R194 Change in bowel habit: Secondary | ICD-10-CM | POA: Diagnosis not present

## 2016-01-05 DIAGNOSIS — H26491 Other secondary cataract, right eye: Secondary | ICD-10-CM | POA: Diagnosis not present

## 2016-01-05 DIAGNOSIS — H35352 Cystoid macular degeneration, left eye: Secondary | ICD-10-CM | POA: Diagnosis not present

## 2016-01-05 DIAGNOSIS — H59032 Cystoid macular edema following cataract surgery, left eye: Secondary | ICD-10-CM | POA: Diagnosis not present

## 2016-01-05 DIAGNOSIS — H35371 Puckering of macula, right eye: Secondary | ICD-10-CM | POA: Diagnosis not present

## 2016-01-12 DIAGNOSIS — E785 Hyperlipidemia, unspecified: Secondary | ICD-10-CM | POA: Diagnosis not present

## 2016-01-12 DIAGNOSIS — R739 Hyperglycemia, unspecified: Secondary | ICD-10-CM | POA: Diagnosis not present

## 2016-01-12 DIAGNOSIS — E119 Type 2 diabetes mellitus without complications: Secondary | ICD-10-CM | POA: Diagnosis not present

## 2016-01-12 DIAGNOSIS — Z79899 Other long term (current) drug therapy: Secondary | ICD-10-CM | POA: Diagnosis not present

## 2016-02-03 DIAGNOSIS — H35371 Puckering of macula, right eye: Secondary | ICD-10-CM | POA: Diagnosis not present

## 2016-02-11 DIAGNOSIS — H35371 Puckering of macula, right eye: Secondary | ICD-10-CM | POA: Diagnosis not present

## 2016-02-11 DIAGNOSIS — Z09 Encounter for follow-up examination after completed treatment for conditions other than malignant neoplasm: Secondary | ICD-10-CM | POA: Diagnosis not present

## 2016-02-15 DIAGNOSIS — L570 Actinic keratosis: Secondary | ICD-10-CM | POA: Diagnosis not present

## 2016-02-15 DIAGNOSIS — D225 Melanocytic nevi of trunk: Secondary | ICD-10-CM | POA: Diagnosis not present

## 2016-02-15 DIAGNOSIS — L814 Other melanin hyperpigmentation: Secondary | ICD-10-CM | POA: Diagnosis not present

## 2016-02-15 DIAGNOSIS — Z86018 Personal history of other benign neoplasm: Secondary | ICD-10-CM | POA: Diagnosis not present

## 2016-02-15 DIAGNOSIS — C4441 Basal cell carcinoma of skin of scalp and neck: Secondary | ICD-10-CM | POA: Diagnosis not present

## 2016-02-15 DIAGNOSIS — Z23 Encounter for immunization: Secondary | ICD-10-CM | POA: Diagnosis not present

## 2016-02-15 DIAGNOSIS — D1801 Hemangioma of skin and subcutaneous tissue: Secondary | ICD-10-CM | POA: Diagnosis not present

## 2016-02-15 DIAGNOSIS — C44519 Basal cell carcinoma of skin of other part of trunk: Secondary | ICD-10-CM | POA: Diagnosis not present

## 2016-02-15 DIAGNOSIS — D485 Neoplasm of uncertain behavior of skin: Secondary | ICD-10-CM | POA: Diagnosis not present

## 2016-02-15 DIAGNOSIS — Z85828 Personal history of other malignant neoplasm of skin: Secondary | ICD-10-CM | POA: Diagnosis not present

## 2016-02-15 DIAGNOSIS — L821 Other seborrheic keratosis: Secondary | ICD-10-CM | POA: Diagnosis not present

## 2016-03-08 DIAGNOSIS — C4441 Basal cell carcinoma of skin of scalp and neck: Secondary | ICD-10-CM | POA: Diagnosis not present

## 2016-03-08 DIAGNOSIS — C44519 Basal cell carcinoma of skin of other part of trunk: Secondary | ICD-10-CM | POA: Diagnosis not present

## 2016-03-10 DIAGNOSIS — H35352 Cystoid macular degeneration, left eye: Secondary | ICD-10-CM | POA: Diagnosis not present

## 2016-03-10 DIAGNOSIS — Z09 Encounter for follow-up examination after completed treatment for conditions other than malignant neoplasm: Secondary | ICD-10-CM | POA: Diagnosis not present

## 2016-03-10 DIAGNOSIS — H35371 Puckering of macula, right eye: Secondary | ICD-10-CM | POA: Diagnosis not present

## 2016-04-19 DIAGNOSIS — H35371 Puckering of macula, right eye: Secondary | ICD-10-CM | POA: Diagnosis not present

## 2016-04-19 DIAGNOSIS — Z09 Encounter for follow-up examination after completed treatment for conditions other than malignant neoplasm: Secondary | ICD-10-CM | POA: Diagnosis not present

## 2016-05-10 DIAGNOSIS — H26491 Other secondary cataract, right eye: Secondary | ICD-10-CM | POA: Diagnosis not present

## 2016-05-17 DIAGNOSIS — Z79899 Other long term (current) drug therapy: Secondary | ICD-10-CM | POA: Diagnosis not present

## 2016-05-17 DIAGNOSIS — Z0000 Encounter for general adult medical examination without abnormal findings: Secondary | ICD-10-CM | POA: Diagnosis not present

## 2016-05-17 DIAGNOSIS — N400 Enlarged prostate without lower urinary tract symptoms: Secondary | ICD-10-CM | POA: Diagnosis not present

## 2016-05-17 DIAGNOSIS — R739 Hyperglycemia, unspecified: Secondary | ICD-10-CM | POA: Diagnosis not present

## 2016-07-22 DIAGNOSIS — R1032 Left lower quadrant pain: Secondary | ICD-10-CM | POA: Diagnosis not present

## 2016-08-16 DIAGNOSIS — H59032 Cystoid macular edema following cataract surgery, left eye: Secondary | ICD-10-CM | POA: Diagnosis not present

## 2016-08-16 DIAGNOSIS — H35352 Cystoid macular degeneration, left eye: Secondary | ICD-10-CM | POA: Diagnosis not present

## 2016-09-12 ENCOUNTER — Encounter: Payer: Self-pay | Admitting: Podiatry

## 2016-09-12 ENCOUNTER — Ambulatory Visit (INDEPENDENT_AMBULATORY_CARE_PROVIDER_SITE_OTHER): Payer: Medicare Other

## 2016-09-12 ENCOUNTER — Ambulatory Visit (INDEPENDENT_AMBULATORY_CARE_PROVIDER_SITE_OTHER): Payer: Medicare Other | Admitting: Podiatry

## 2016-09-12 VITALS — BP 154/83 | HR 63

## 2016-09-12 DIAGNOSIS — R52 Pain, unspecified: Secondary | ICD-10-CM | POA: Diagnosis not present

## 2016-09-12 DIAGNOSIS — M205X2 Other deformities of toe(s) (acquired), left foot: Secondary | ICD-10-CM

## 2016-09-12 DIAGNOSIS — M722 Plantar fascial fibromatosis: Secondary | ICD-10-CM | POA: Diagnosis not present

## 2016-09-12 NOTE — Patient Instructions (Addendum)
Look at a Powerstep or a Superfeet insert for your shoes.     Plantar Fasciitis Rehab Ask your health care provider which exercises are safe for you. Do exercises exactly as told by your health care provider and adjust them as directed. It is normal to feel mild stretching, pulling, tightness, or discomfort as you do these exercises, but you should stop right away if you feel sudden pain or your pain gets worse. Do not begin these exercises until told by your health care provider. Stretching and range of motion exercises These exercises warm up your muscles and joints and improve the movement and flexibility of your foot. These exercises also help to relieve pain. Exercise A: Plantar fascia stretch  1. Sit with your left / right leg crossed over your opposite knee. 2. Hold your heel with one hand with that thumb near your arch. With your other hand, hold your toes and gently pull them back toward the top of your foot. You should feel a stretch on the bottom of your toes or your foot or both. 3. Hold this stretch for__________ seconds. 4. Slowly release your toes and return to the starting position. Repeat __________ times. Complete this exercise __________ times a day. Exercise B: Gastroc, standing  1. Stand with your hands against a wall. 2. Extend your left / right leg behind you, and bend your front knee slightly. 3. Keeping your heels on the floor and keeping your back knee straight, shift your weight toward the wall without arching your back. You should feel a gentle stretch in your left / right calf. 4. Hold this position for __________ seconds. Repeat __________ times. Complete this exercise __________ times a day. Exercise C: Soleus, standing 1. Stand with your hands against a wall. 2. Extend your left / right leg behind you, and bend your front knee slightly. 3. Keeping your heels on the floor, bend your back knee and slightly shift your weight over the back leg. You should feel a  gentle stretch deep in your calf. 4. Hold this position for __________ seconds. Repeat __________ times. Complete this exercise __________ times a day. Exercise D: Gastrocsoleus, standing 1. Stand with the ball of your left / right foot on a step. The ball of your foot is on the walking surface, right under your toes. 2. Keep your other foot firmly on the same step. 3. Hold onto the wall or a railing for balance. 4. Slowly lift your other foot, allowing your body weight to press your heel down over the edge of the step. You should feel a stretch in your left / right calf. 5. Hold this position for __________ seconds. 6. Return both feet to the step. 7. Repeat this exercise with a slight bend in your left / right knee. Repeat __________ times with your left / right knee straight and __________ times with your left / right knee bent. Complete this exercise __________ times a day. Balance exercise This exercise builds your balance and strength control of your arch to help take pressure off your plantar fascia. Exercise E: Single leg stand 1. Without shoes, stand near a railing or in a doorway. You may hold onto the railing or door frame as needed. 2. Stand on your left / right foot. Keep your big toe down on the floor and try to keep your arch lifted. Do not let your foot roll inward. 3. Hold this position for __________ seconds. 4. If this exercise is too easy, you can try  it with your eyes closed or while standing on a pillow. Repeat __________ times. Complete this exercise __________ times a day. This information is not intended to replace advice given to you by your health care provider. Make sure you discuss any questions you have with your health care provider. Document Released: 03/14/2005 Document Revised: 11/17/2015 Document Reviewed: 01/26/2015 Elsevier Interactive Patient Education  2018 [employer].

## 2016-09-12 NOTE — Progress Notes (Signed)
   Subjective:    Patient ID: Jeremy Holmes, male    DOB: 05-08-1933, 83 y.o.   MRN: [id number]  HPI 83 year old male presents the office today for concerns of pain to his left foot which started about 8 months ago when he was playing golf. He was involved about 2 weeks ago he is walking to church instructed to get pain to the arch of the foot as well as the heel. The pain didn't resolve it is currently not having any pain. He denies any swelling or redness and denies any numbness or tingling. No recent treatment. Pain does not wake him up at night. He has no other concerns today.   Review of Systems  All other systems reviewed and are negative.      Objective:   Physical Exam General: AAO x3, NAD  Dermatological: Skin is warm, dry and supple bilateral. Nails x 10 are well manicured; remaining integument appears unremarkable at this time. There are no open sores, no preulcerative lesions, no rash or signs of infection present.  Vascular: Dorsalis Pedis artery and Posterior Tibial artery pedal pulses are 2/4 bilateral with immedate capillary fill time. Pedal hair growth present.  There is no pain with calf compression, swelling, warmth, erythema.   Neruologic: Grossly intact via light touch bilateral. Vibratory intact via tuning fork bilateral. Protective threshold with Semmes Wienstein monofilament intact to all pedal sites bilateral. Negative tinel sign.  Musculoskeletal: This time there is no area of tenderness identified to bilateral lower extremities there is no area pinpoint bony tenderness. There is no overlying edema, erythema, increase in warmth. On the left foot upon palpation of plantar medial tubercle of the calcaneus at insertion of plantar fasciitis and along the arch of the foot is or he had the pain but currently does not expend any symptoms. Decrease in first MTPJ range of motion. There is no pain lateral compression of calcaneus. Achilles tendon intact with any pain. No other  area tenderness of the foot this time. MMT 5/5.   Assessment: Left foot pain likely was also plantar fasciitis currently asymptomatic; hallux limitus  Plan: -Treatment options discussed including all alternatives, risks, and complications -Etiology of symptoms were discussed -X-rays were obtained and reviewed with the patient. No evidence of acute fracture identified.  -I discussed and she modifications and orthotics as well.  -Stretching, rehabilitation exercises discussed.  -Will off on any injection as he is, a symptomatic.  -Follow-up as needed. If the pain recurs call the office immediately so we can evaluate.   Ovid CurdMatthew Loneta Tamplin, DPM

## 2016-10-11 DIAGNOSIS — H353131 Nonexudative age-related macular degeneration, bilateral, early dry stage: Secondary | ICD-10-CM | POA: Diagnosis not present

## 2016-10-11 DIAGNOSIS — H35371 Puckering of macula, right eye: Secondary | ICD-10-CM | POA: Diagnosis not present

## 2016-10-11 DIAGNOSIS — H59032 Cystoid macular edema following cataract surgery, left eye: Secondary | ICD-10-CM | POA: Diagnosis not present

## 2016-11-08 DIAGNOSIS — R739 Hyperglycemia, unspecified: Secondary | ICD-10-CM | POA: Diagnosis not present

## 2016-11-08 DIAGNOSIS — M25572 Pain in left ankle and joints of left foot: Secondary | ICD-10-CM | POA: Diagnosis not present

## 2016-11-08 DIAGNOSIS — G8929 Other chronic pain: Secondary | ICD-10-CM | POA: Diagnosis not present

## 2016-11-08 DIAGNOSIS — Z79899 Other long term (current) drug therapy: Secondary | ICD-10-CM | POA: Diagnosis not present

## 2016-11-08 DIAGNOSIS — E785 Hyperlipidemia, unspecified: Secondary | ICD-10-CM | POA: Diagnosis not present

## 2016-12-03 DIAGNOSIS — R1032 Left lower quadrant pain: Secondary | ICD-10-CM | POA: Diagnosis not present

## 2016-12-20 DIAGNOSIS — K5792 Diverticulitis of intestine, part unspecified, without perforation or abscess without bleeding: Secondary | ICD-10-CM | POA: Diagnosis not present

## 2016-12-22 ENCOUNTER — Other Ambulatory Visit: Payer: Self-pay | Admitting: Family Medicine

## 2016-12-22 DIAGNOSIS — K5792 Diverticulitis of intestine, part unspecified, without perforation or abscess without bleeding: Secondary | ICD-10-CM

## 2016-12-27 ENCOUNTER — Ambulatory Visit
Admission: RE | Admit: 2016-12-27 | Discharge: 2016-12-27 | Disposition: A | Discharge status: Home / Self Care | Payer: Medicare Other | Source: Ambulatory Visit | Attending: Family Medicine | Admitting: Family Medicine

## 2016-12-27 DIAGNOSIS — K5792 Diverticulitis of intestine, part unspecified, without perforation or abscess without bleeding: Secondary | ICD-10-CM

## 2016-12-27 DIAGNOSIS — K5730 Diverticulosis of large intestine without perforation or abscess without bleeding: Secondary | ICD-10-CM | POA: Diagnosis not present

## 2016-12-27 MED ORDER — IOPAMIDOL (ISOVUE-300) INJECTION 61%
100.0000 mL | Freq: Once | INTRAVENOUS | Status: AC | PRN
  Administered 2016-12-27: 100 mL via INTRAVENOUS

## 2017-01-06 DIAGNOSIS — R1032 Left lower quadrant pain: Secondary | ICD-10-CM | POA: Diagnosis not present

## 2017-01-06 DIAGNOSIS — R933 Abnormal findings on diagnostic imaging of other parts of digestive tract: Secondary | ICD-10-CM | POA: Diagnosis not present

## 2017-01-06 DIAGNOSIS — R194 Change in bowel habit: Secondary | ICD-10-CM | POA: Diagnosis not present

## 2017-01-06 DIAGNOSIS — K5730 Diverticulosis of large intestine without perforation or abscess without bleeding: Secondary | ICD-10-CM | POA: Diagnosis not present

## 2017-01-20 DIAGNOSIS — Z23 Encounter for immunization: Secondary | ICD-10-CM | POA: Diagnosis not present

## 2017-02-15 DIAGNOSIS — R7303 Prediabetes: Secondary | ICD-10-CM | POA: Diagnosis not present

## 2017-02-15 DIAGNOSIS — E785 Hyperlipidemia, unspecified: Secondary | ICD-10-CM | POA: Diagnosis not present

## 2017-04-11 DIAGNOSIS — R933 Abnormal findings on diagnostic imaging of other parts of digestive tract: Secondary | ICD-10-CM | POA: Diagnosis not present

## 2017-04-11 DIAGNOSIS — R1032 Left lower quadrant pain: Secondary | ICD-10-CM | POA: Diagnosis not present

## 2017-04-11 DIAGNOSIS — K640 First degree hemorrhoids: Secondary | ICD-10-CM | POA: Diagnosis not present

## 2017-04-11 DIAGNOSIS — R194 Change in bowel habit: Secondary | ICD-10-CM | POA: Diagnosis not present

## 2017-04-11 DIAGNOSIS — K5730 Diverticulosis of large intestine without perforation or abscess without bleeding: Secondary | ICD-10-CM | POA: Diagnosis not present

## 2017-05-23 DIAGNOSIS — R7303 Prediabetes: Secondary | ICD-10-CM | POA: Diagnosis not present

## 2017-05-23 DIAGNOSIS — E785 Hyperlipidemia, unspecified: Secondary | ICD-10-CM | POA: Diagnosis not present

## 2017-05-26 DIAGNOSIS — M7918 Myalgia, other site: Secondary | ICD-10-CM | POA: Diagnosis not present

## 2017-05-26 DIAGNOSIS — M47816 Spondylosis without myelopathy or radiculopathy, lumbar region: Secondary | ICD-10-CM | POA: Diagnosis not present

## 2017-05-26 DIAGNOSIS — M549 Dorsalgia, unspecified: Secondary | ICD-10-CM | POA: Diagnosis not present

## 2017-07-11 ENCOUNTER — Ambulatory Visit (INDEPENDENT_AMBULATORY_CARE_PROVIDER_SITE_OTHER): Payer: Medicare Other | Admitting: Orthopaedic Surgery

## 2017-07-11 ENCOUNTER — Ambulatory Visit (INDEPENDENT_AMBULATORY_CARE_PROVIDER_SITE_OTHER): Payer: Self-pay

## 2017-07-11 ENCOUNTER — Encounter (INDEPENDENT_AMBULATORY_CARE_PROVIDER_SITE_OTHER): Payer: Self-pay | Admitting: Orthopaedic Surgery

## 2017-07-11 VITALS — BP 169/74 | HR 71 | Resp 16 | Ht 68.0 in | Wt 147.0 lb

## 2017-07-11 DIAGNOSIS — M25511 Pain in right shoulder: Secondary | ICD-10-CM

## 2017-07-11 MED ORDER — DICLOFENAC SODIUM 1 % TD GEL: 2.0000 g | Freq: Four times a day (QID) | TRANSDERMAL | 2 refills | Status: DC

## 2017-07-11 NOTE — Progress Notes (Signed)
Office Visit Note   Patient: Jeremy Holmes           Date of Birth: 12-12-1933           MRN: [id number] Visit Date: 07/11/2017              Requested by: [name], MD [address] [place], [place] [address] PCP: [name], MD   Assessment & Plan: Visit Diagnoses:  1. Acute pain of right shoulder     Plan: Acute onset right shoulder pain 2 weeks ago.  Possible repeat rotator cuff tear.  Presently feeling better.  I would suggest Voltaren gel, gentle exercises and return to the office in 2 weeks if not much better and consider cortisone injection.    Follow-Up Instructions: No follow-ups on file.   Orders:  Orders Placed This Encounter  Procedures  . XR Shoulder Right   No orders of the defined types were placed in this encounter.     Procedures: No procedures performed   Clinical Data: No additional findings.   Subjective: Chief Complaint  Patient presents with  . Right Shoulder - Pain, Injury  . New Patient (Initial Visit)    HELPED START MOWER ABOUT 2 WEEKS AGO, STARTED GETTING BETTER, THEN PLAYED GOLF AND NOTICED SHOULDER PAIN AGAIN  Mr. Jeremy Holmes noted onset of right shoulder pain approximately 2 weeks ago after helping a friend start a lawnmower with a pull cord.  For further day or 2 and then it seemed to feel better so he played golf which aggravated his shoulder pain.  He is concerned that he may have "torn something" and thus visits the office.  His past history is significant that he has had bilateral rotator cuff cuff tears with surgery.  Apparently he had a "patch" on the right.  Surgery was close to 30 years ago on the right and probably 12-15 years ago on the left done well in between.  Presently having a little bit of soreness but "not too bad" in his right shoulder.  No neck pain.  No swelling.  No ecchymosis.  He denies any numbness or tingling  HPI  Review of Systems  Constitutional: Negative for fatigue.  HENT: Negative for ear pain.     Eyes: Negative for pain.  Respiratory: Negative for cough and shortness of breath.   Cardiovascular: Negative for leg swelling.  Gastrointestinal: Negative for constipation and diarrhea.  Genitourinary: Negative for difficulty urinating.  Musculoskeletal: Positive for back pain. Negative for neck pain.  Skin: Negative for rash.  Allergic/Immunologic: Negative for food allergies.  Neurological: Negative for weakness and numbness.  Hematological: Bruises/bleeds easily.  Psychiatric/Behavioral: Negative for sleep disturbance.     Objective: Vital Signs: BP (!) 169/74 (BP Location: Left Arm, Patient Position: Sitting, Cuff Size: Normal)   Pulse 71   Resp 16   Ht 5\' 8"  (1.727 m)   Wt 147 lb (66.7 kg)   BMI 22.35 kg/m   Physical Exam  Ortho Exam awake alert and oriented x3.  Comfortable sitting.  Able to place his right arm fully overhead.  Minimal discomfort.  Minimal pain on impingement testing on external rotation.  Biceps intact.  No edema.  Cysts.  Good grip and good release.  Skin intact.  Mild tenderness over the anterior aspect of the shoulder in the subacromial region.  No pain at the acromioclavicular joint.  Good strength with internal/external rotation.  Mild pain with external rotation in the subacromial region.  Specialty  Comments:  No specialty comments available.  Imaging: No results found.   PMFS History: Patient Active Problem List   Diagnosis Date Noted  . Generalized anxiety disorder 06/07/2014  . Adjustment disorder with depressed mood 06/07/2014   History reviewed. No pertinent past medical history.  History reviewed. No pertinent family history.  Past Surgical History:  Procedure Laterality Date  . ELBOW SURGERY    . SHOULDER SURGERY     Social History   Occupational History  . Not on file  Tobacco Use  . Smoking status: Former Games developermoker  . Smokeless tobacco: Never Used  Substance and Sexual Activity  . Alcohol use: No  . Drug use: No  . Sexual  activity: Not on file

## 2017-07-18 ENCOUNTER — Telehealth (INDEPENDENT_AMBULATORY_CARE_PROVIDER_SITE_OTHER): Payer: Self-pay | Admitting: Orthopaedic Surgery

## 2017-07-18 NOTE — Telephone Encounter (Signed)
Tatisha from [employer] left a voicemail stating that patient's prescription for Diclofenac Sodium has been approved for one full year.  The approval dates are 07/18/17 to 07/19/18.  If you have any questions, please call [phone]

## 2017-07-20 NOTE — Telephone Encounter (Signed)
Left message that rx was approved for 1 year

## 2017-07-24 ENCOUNTER — Ambulatory Visit (INDEPENDENT_AMBULATORY_CARE_PROVIDER_SITE_OTHER): Payer: Medicare Other | Admitting: Orthopaedic Surgery

## 2017-09-22 DIAGNOSIS — M47816 Spondylosis without myelopathy or radiculopathy, lumbar region: Secondary | ICD-10-CM | POA: Diagnosis not present

## 2017-09-22 DIAGNOSIS — M7918 Myalgia, other site: Secondary | ICD-10-CM | POA: Diagnosis not present

## 2017-12-09 DIAGNOSIS — S0191XA Laceration without foreign body of unspecified part of head, initial encounter: Secondary | ICD-10-CM | POA: Diagnosis not present

## 2017-12-14 DIAGNOSIS — M25569 Pain in unspecified knee: Secondary | ICD-10-CM | POA: Diagnosis not present

## 2017-12-15 ENCOUNTER — Other Ambulatory Visit: Payer: Self-pay | Admitting: Family Medicine

## 2017-12-15 DIAGNOSIS — M7121 Synovial cyst of popliteal space [Baker], right knee: Secondary | ICD-10-CM

## 2017-12-18 ENCOUNTER — Ambulatory Visit
Admission: RE | Admit: 2017-12-18 | Discharge: 2017-12-18 | Disposition: A | Discharge status: Home / Self Care | Payer: Medicare Other | Source: Ambulatory Visit | Attending: Family Medicine | Admitting: Family Medicine

## 2017-12-18 DIAGNOSIS — M7121 Synovial cyst of popliteal space [Baker], right knee: Secondary | ICD-10-CM | POA: Diagnosis not present

## 2017-12-21 ENCOUNTER — Ambulatory Visit (INDEPENDENT_AMBULATORY_CARE_PROVIDER_SITE_OTHER): Payer: Medicare Other | Admitting: Orthopaedic Surgery

## 2017-12-22 ENCOUNTER — Ambulatory Visit (INDEPENDENT_AMBULATORY_CARE_PROVIDER_SITE_OTHER): Payer: Medicare Other | Admitting: Orthopaedic Surgery

## 2017-12-22 ENCOUNTER — Encounter (INDEPENDENT_AMBULATORY_CARE_PROVIDER_SITE_OTHER): Payer: Self-pay | Admitting: Orthopaedic Surgery

## 2017-12-22 ENCOUNTER — Ambulatory Visit (INDEPENDENT_AMBULATORY_CARE_PROVIDER_SITE_OTHER): Payer: Self-pay

## 2017-12-22 VITALS — BP 154/71 | HR 62 | Ht 68.0 in | Wt 150.0 lb

## 2017-12-22 DIAGNOSIS — M25561 Pain in right knee: Secondary | ICD-10-CM | POA: Diagnosis not present

## 2017-12-22 MED ORDER — BUPIVACAINE HCL 0.5 % IJ SOLN
2.0000 mL | INTRAMUSCULAR | Status: AC | PRN
Start: 2017-12-22 — End: 2017-12-22
  Administered 2017-12-22: 2 mL via INTRA_ARTICULAR

## 2017-12-22 MED ORDER — LIDOCAINE HCL 1 % IJ SOLN
2.0000 mL | INTRAMUSCULAR | Status: AC | PRN
  Administered 2017-12-22: 2 mL

## 2017-12-22 MED ORDER — METHYLPREDNISOLONE ACETATE 40 MG/ML IJ SUSP
80.0000 mg | INTRAMUSCULAR | Status: AC | PRN
  Administered 2017-12-22: 80 mg

## 2017-12-22 NOTE — Progress Notes (Signed)
Office Visit Note   Patient: Jeremy Holmes           Date of Birth: Dec 20, 1933           MRN: [id number] Visit Date: 12/22/2017              Requested by: Jeremy Screws, MD [address] [place], [place] [address] PCP: Jeremy Screws, MD   Assessment & Plan: Visit Diagnoses:  1. 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  Long discussion over 30 minutes regarding present problem and treatment options.  I think it is worth injecting in the knee with cortisone and monitoring the response.  The Baker's cyst does not appear to be symptomatic and does not need to be aspirated at this point.  Return to the office over the next several weeks if still having a problem. over-the-counter medicines as needed  Follow-Up Instructions: Return if symptoms worsen or fail to improve.   Orders:  Orders Placed This Encounter  Procedures  . Large Joint Inj: R knee  . XR KNEE 3 VIEW RIGHT   No orders of the defined types were placed in this encounter.     Procedures: Large Joint Inj: R knee on 12/22/2017 3:54 PM Indications: pain and diagnostic evaluation Details: 25 G 1.5 in needle, anteromedial approach  Arthrogram: No  Medications: 2 mL lidocaine 1 %; 2 mL bupivacaine 0.5 %; 80 mg methylPREDNISolone acetate 40 MG/ML Procedure, treatment alternatives, risks and benefits explained, specific risks discussed. Consent was given by the patient. Immediately prior to procedure a time out was called to verify the correct patient, procedure, equipment, support staff and site/side marked as required. Patient was prepped and draped in the usual sterile fashion.       Clinical Data: No additional findings.   Subjective: Chief Complaint  Patient presents with  . New Patient (Initial Visit)    MULTI FOCAL CYST ON RIGHT KNEE REF BY  DR Jeremy Holmes  Jeremy Holmes is 84 years old and visited the office for evaluation of acute onset of right knee pain.  He was  visiting the beach nearly 2 weeks ago when he experienced acute onset of right knee pain to the point where he could not "bear the pain".  He denies injury or trauma.  His knee was swollen.  He developed a mass in the posterior aspect of his knee.  He was seen at an urgent care facility and given medicine.  An ultrasound was performed demonstrating a multilobulated Baker's cyst.  He is feeling better but still having some discomfort and thus visits the office.  HPI  Review of Systems  Constitutional: Negative for fatigue and fever.  HENT: Negative for ear pain.   Eyes: Negative for pain.  Respiratory: Negative for cough and shortness of breath.   Cardiovascular: Positive for leg swelling.  Gastrointestinal: Negative for constipation and diarrhea.  Genitourinary: Negative for difficulty urinating.  Musculoskeletal: Negative for back pain and neck pain.  Skin: Negative for rash.  Allergic/Immunologic: Negative for food allergies.  Neurological: Positive for weakness. Negative for numbness.  Hematological: Bruises/bleeds easily.  Psychiatric/Behavioral: Negative for sleep disturbance.     Objective: Vital Signs: BP (!) 154/71 (BP Location: Left Arm, Patient Position: Sitting, Cuff Size: Normal)   Pulse 62   Ht 5\' 8"  (1.727 m)   Wt 150 lb (68 kg)   BMI 22.81 kg/m   Physical Exam  Constitutional: He is  oriented to person, place, and time. He appears well-developed and well-nourished.  HENT:  Mouth/Throat: Oropharynx is clear and moist.  Eyes: Pupils are equal, round, and reactive to light. EOM are normal.  Pulmonary/Chest: Effort normal.  Neurological: He is alert and oriented to person, place, and time.  Skin: Skin is warm and dry.  Psychiatric: He has a normal mood and affect. His behavior is normal.    Ortho Exam right knee with effusion.  Obvious Baker's cyst along the posterior medial aspect of the knee it is firm and probably measures several centimeters in diameter but not  tender.  No calf pain.  Neurologically intact.  No distal edema.  Full extension and flexed over 105 degrees without instability.  Specialty Comments:  No specialty comments available.  Imaging: Xr Knee 3 View Right  Result Date: 12/22/2017 Views of the right knee performed in 3 views in  the standing projection.  Degenerative changes in all 3 compartments with narrowing of the joint space.  Some ectopic calcification within the soft tissue consistent with CPPD.    PMFS History: Patient Active Problem List   Diagnosis Date Noted  . Generalized anxiety disorder 06/07/2014  . Adjustment disorder with depressed mood 06/07/2014   History reviewed. No pertinent past medical history.  History reviewed. No pertinent family history.  Past Surgical History:  Procedure Laterality Date  . ELBOW SURGERY    . SHOULDER SURGERY     Social History   Occupational History  . Not on file  Tobacco Use  . Smoking status: Former Games developer  . Smokeless tobacco: Never Used  Substance and Sexual Activity  . Alcohol use: No  . Drug use: No  . Sexual activity: Not on file

## 2018-01-12 DIAGNOSIS — Z23 Encounter for immunization: Secondary | ICD-10-CM | POA: Diagnosis not present

## 2018-02-06 DIAGNOSIS — N5201 Erectile dysfunction due to arterial insufficiency: Secondary | ICD-10-CM | POA: Diagnosis not present

## 2018-02-06 DIAGNOSIS — E785 Hyperlipidemia, unspecified: Secondary | ICD-10-CM | POA: Diagnosis not present

## 2018-02-06 DIAGNOSIS — M2550 Pain in unspecified joint: Secondary | ICD-10-CM | POA: Diagnosis not present

## 2018-02-06 DIAGNOSIS — R7303 Prediabetes: Secondary | ICD-10-CM | POA: Diagnosis not present

## 2018-10-24 IMAGING — CT CT ABD-PELV W/ CM
1 of 3 series · 13 of 32 positions shown, 18 images · IV contrast (iopamidol)
Comparison: [DATE]/[id number] CT.

CLINICAL DATA: 83-year-old male with generalized abdominal pain.
History of diverticulitis, most recent episode 12/13/2016 treated
with antibiotics. Initial encounter.

Creatinine was obtained on site at [HOSPITAL] at [HOSPITAL].
Results: Creatinine 0.8 mg/dL.
EXAM:
CT ABDOMEN AND PELVIS WITH CONTRAST
TECHNIQUE: Multidetector CT imaging of the abdomen and pelvis was performed
using the standard protocol following bolus administration of
intravenous contrast.
CONTRAST:  100mL O6UPMD-SSS IOPAMIDOL (O6UPMD-SSS) INJECTION 61%

[Series 2: abd/pelvis w/cm · axial · 0.74mm/px · z∈[-504,-99]mm · 13 of 93 slices shown, 18 images]
[im 6/93  soft-tissue]
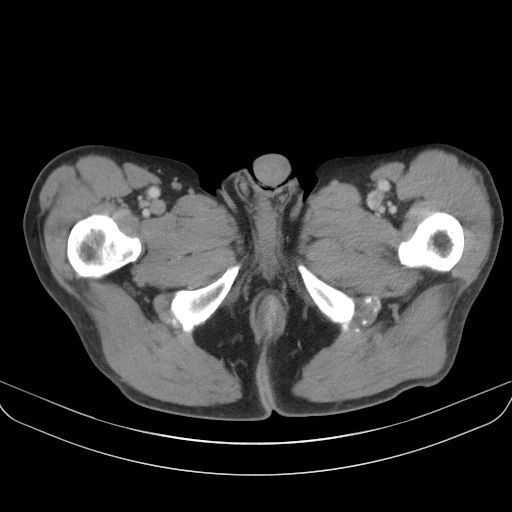
[im 6/93  bone]
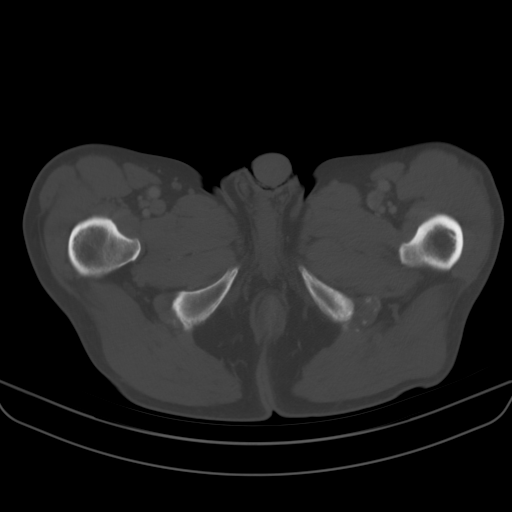
[im 16/93  soft-tissue]
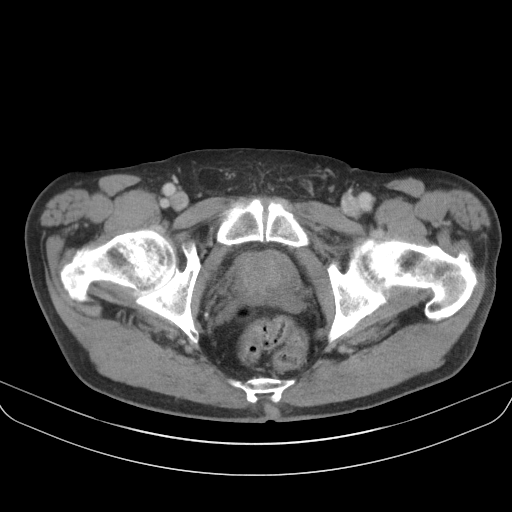
[im 21/93  soft-tissue]
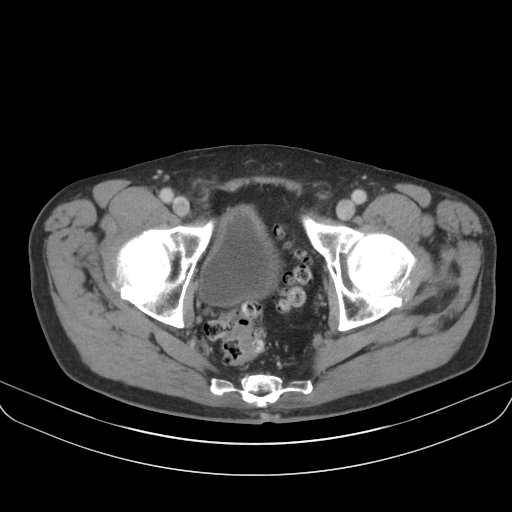
[im 26/93  soft-tissue]
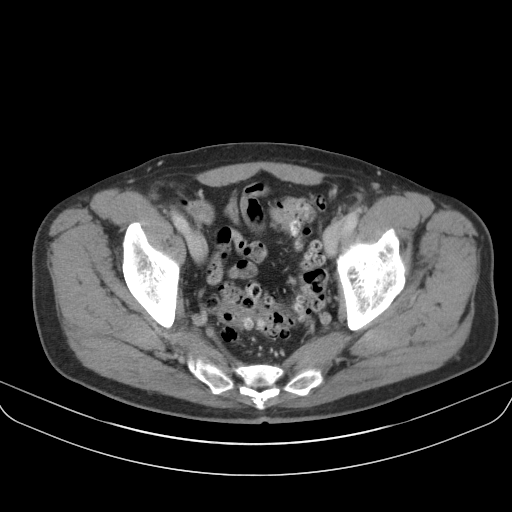
[im 36/93  soft-tissue]
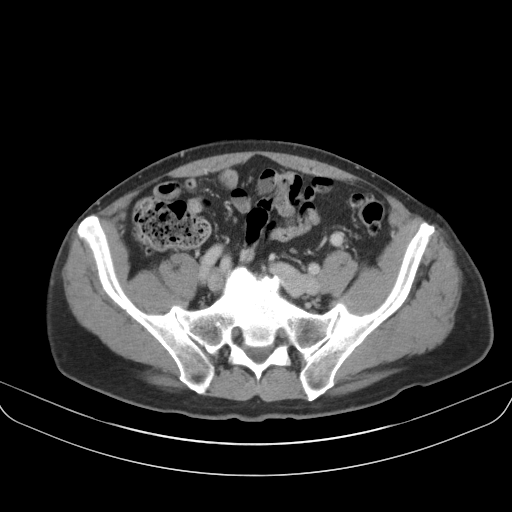
[im 41/93  soft-tissue]
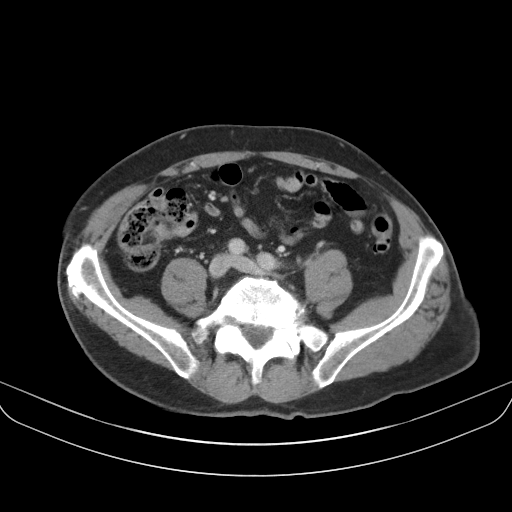
[im 52/93  soft-tissue]
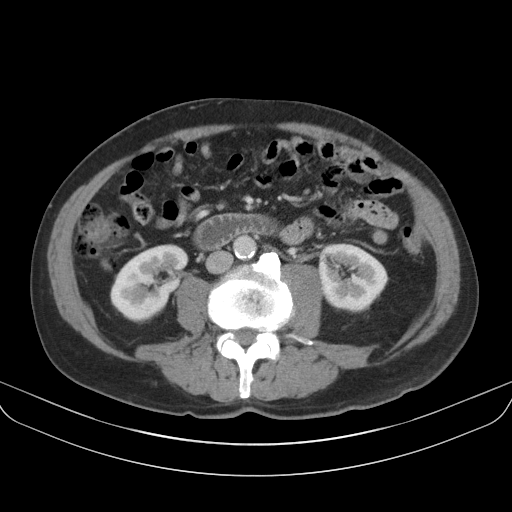
[im 57/93  soft-tissue]
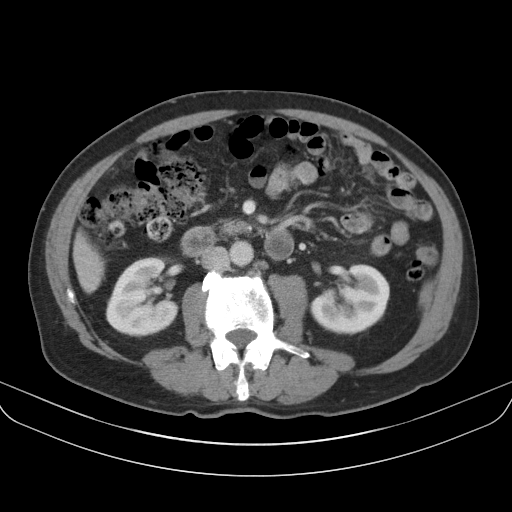
[im 67/93  soft-tissue]
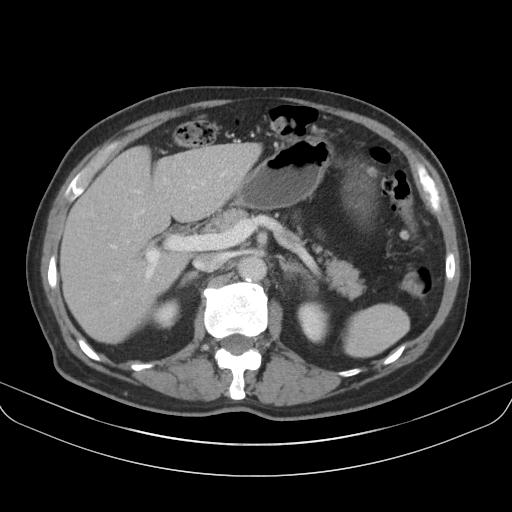
[im 67/93  bone]
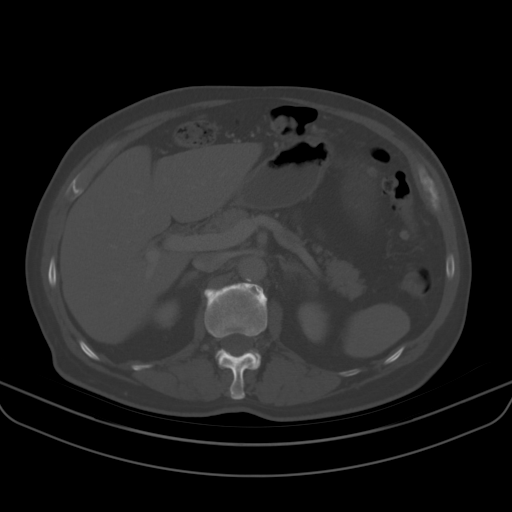
[im 72/93  soft-tissue]
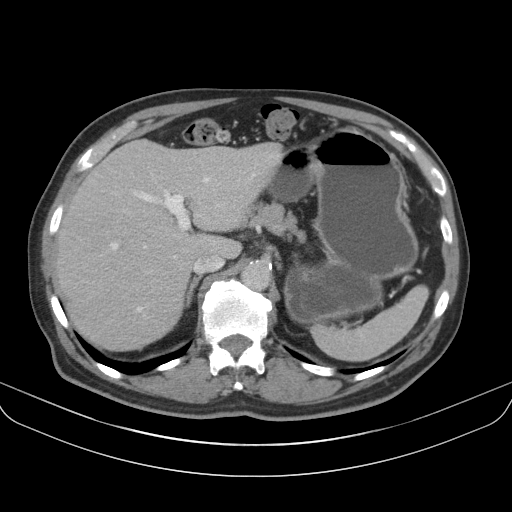
[im 72/93  lung]
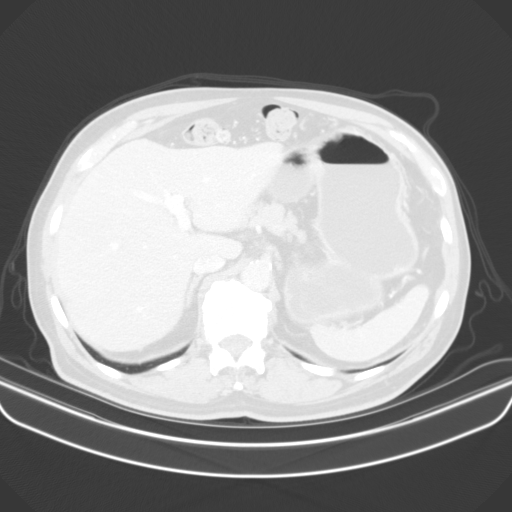
[im 77/93  soft-tissue]
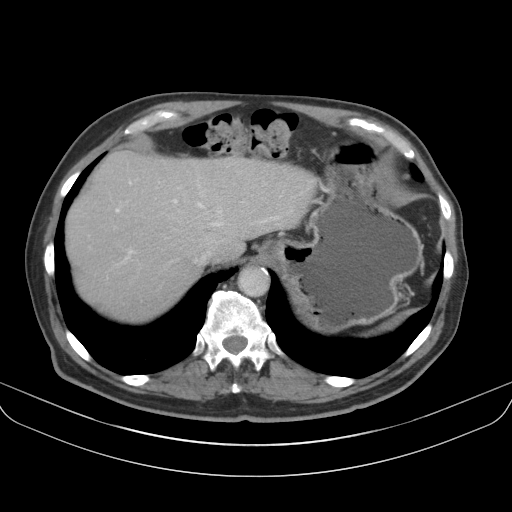
[im 77/93  lung]
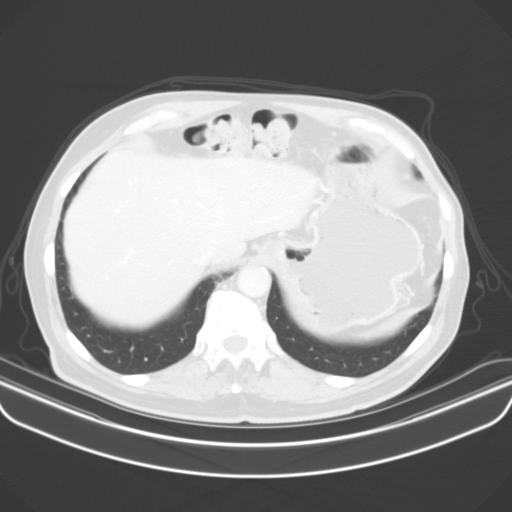
[im 82/93  lung]
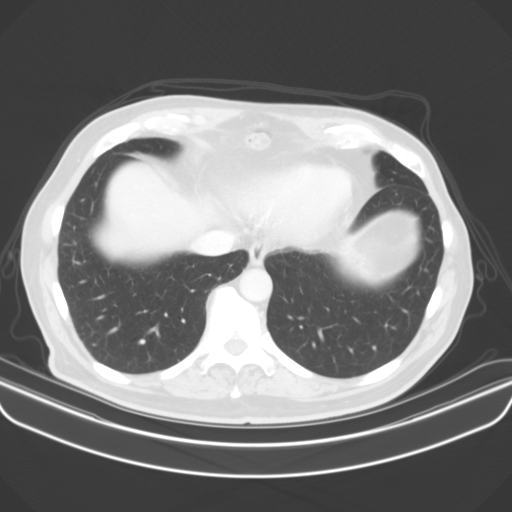
[im 87/93  soft-tissue]
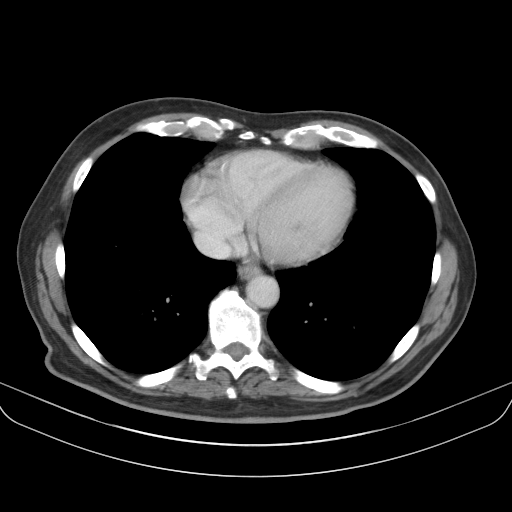
[im 87/93  lung]
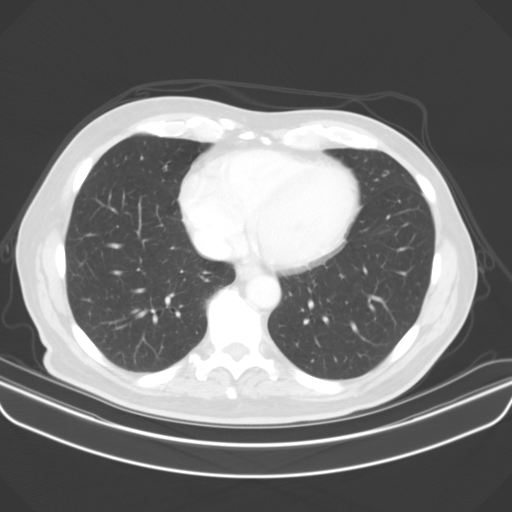

[13 of 32 positions shown; findings below may reference images not displayed]

FINDINGS: Lower chest: Tiny nodule peripheral lower right middle lobe stable.
No worrisome lung base abnormality. Heart size within normal limits.
Coronary artery calcification.

Hepatobiliary: No worrisome hepatic lesion or calcified gallstone

Pancreas: No pancreatic mass or inflammation.

Spleen: No splenic mass or enlargement.

Adrenals/Urinary Tract: No hydronephrosis or obstructing stone. No
worrisome renal or adrenal lesion. Left renal lower pole
subcentimeter low-density structure suggestive of angiomyelolipoma.

Partially decompressed urinary bladder with circumferential
thickening. Impression upon the bladder base by enlarged prostate
gland.

Stomach/Bowel: Prominent colonic diverticula. Findings most notable
sigmoid colon with significant associated muscular hypertrophy. No
evidence of extra normal bowel inflammatory process, free fluid or
free air.

No gastric or small bowel abnormality noted.

Vascular/Lymphatic: Atherosclerotic changes aorta and branch vessels
without aneurysm or large vessel occlusion.

No adenopathy.

Reproductive: Enlarged prostate gland impressing upon the bladder
base. Clinical and laboratory correlation recommended.

Other: Fat containing periumbilical hernia. Fat containing inguinal
hernia. No bowel containing hernia.

Musculoskeletal: Degenerative changes lumbar spine most notable L3-4
through L5-S1. Minimal curvature convex right. No osseous
destructive lesion.
IMPRESSION: Diverticulosis most notable sigmoid colon with significant
associated muscular hypertrophy but without evidence of
inflammation.

Enlarged prostate gland impressing upon the bladder base. Clinical
and laboratory correlation recommended.

Aortic Atherosclerosis ([id number]).

Coronary artery calcifications.

Degenerative changes lumbar spine most notable L3-4 through L5-S1.

Fat containing periumbilical hernia. Fat containing inguinal hernia.
No bowel containing hernia.

## 2018-10-28 IMAGING — US US EXTREM LOW*R* LIMITED
1 series · 14 of 21 positions shown · non-contrast
Comparison: None.

CLINICAL DATA: Posterior soft tissue swelling in the right knee
with pain

EXAM:
ULTRASOUND RIGHT LOWER EXTREMITY LIMITED
TECHNIQUE: Ultrasound examination of the lower extremity soft tissues was
performed in the area of clinical concern.

[Series 1: us extrem low*right* limited · 0.06mm/px · 14 of 21 slices shown]
[im 1/21]
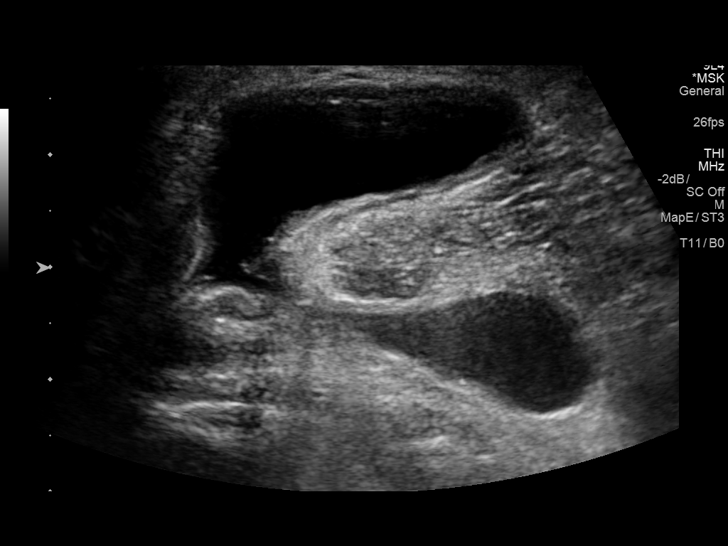
[im 3/21]
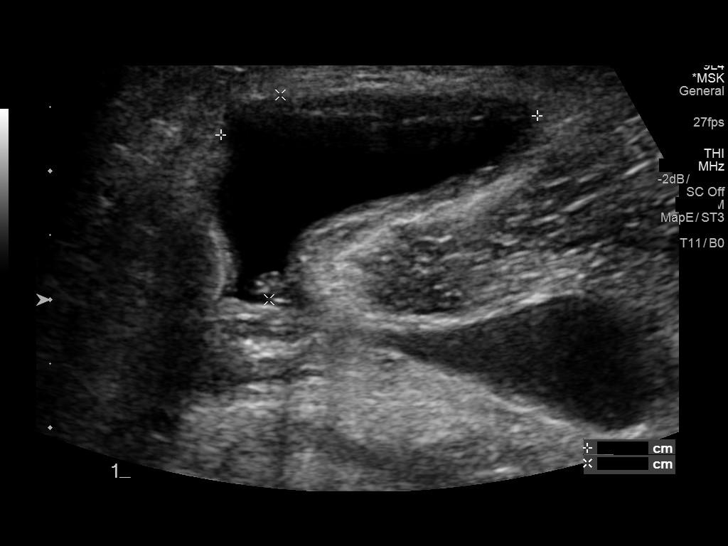
[im 4/21]
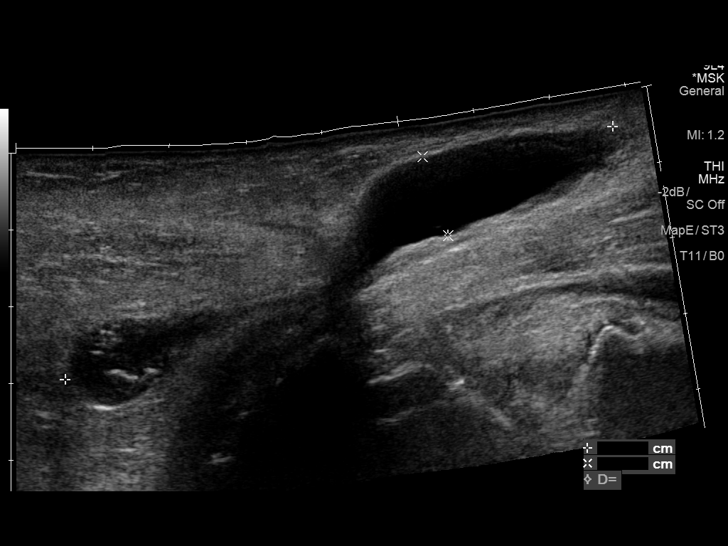
[im 6/21]
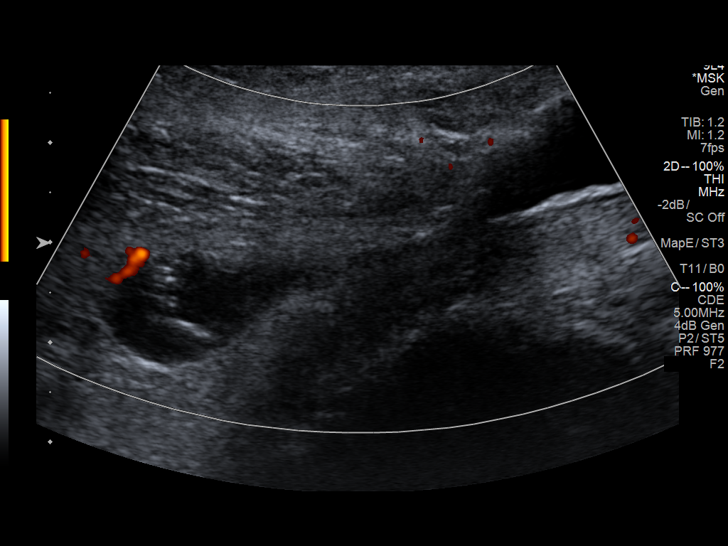
[im 7/21]
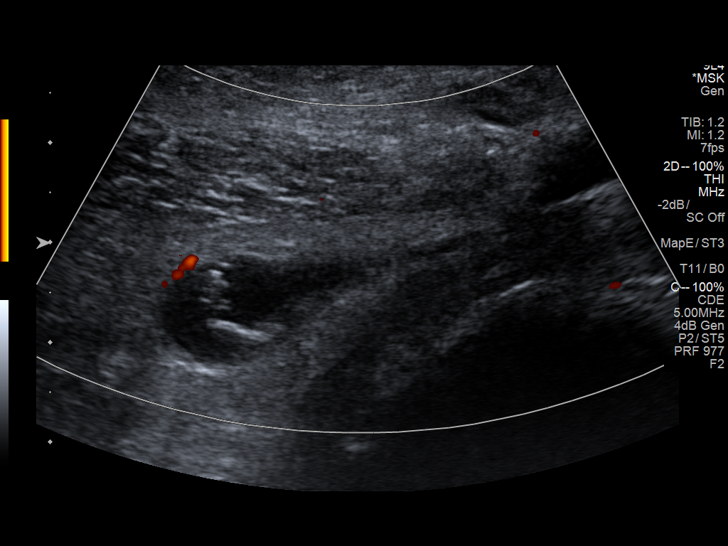
[im 9/21]
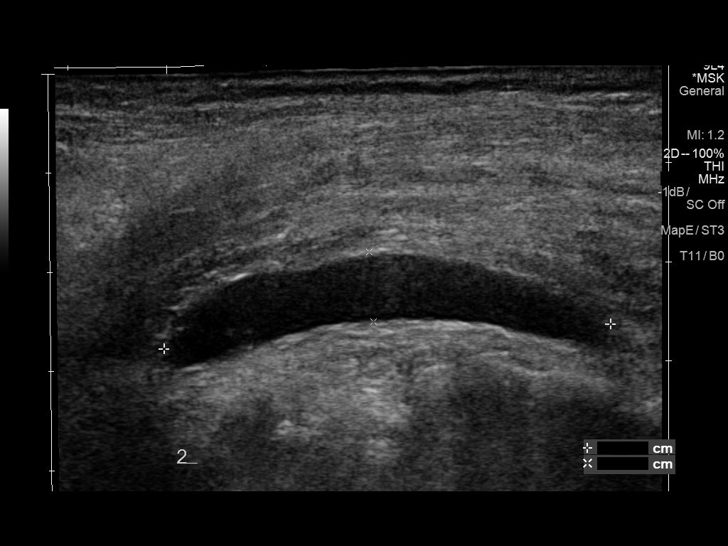
[im 10/21]
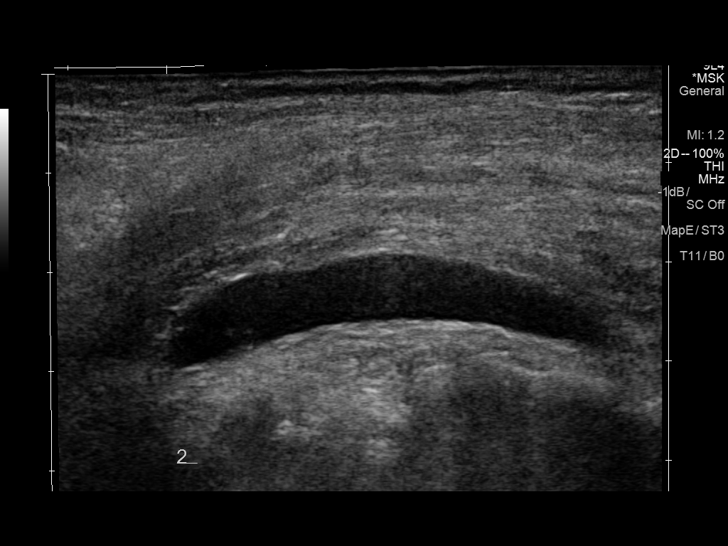
[im 12/21]
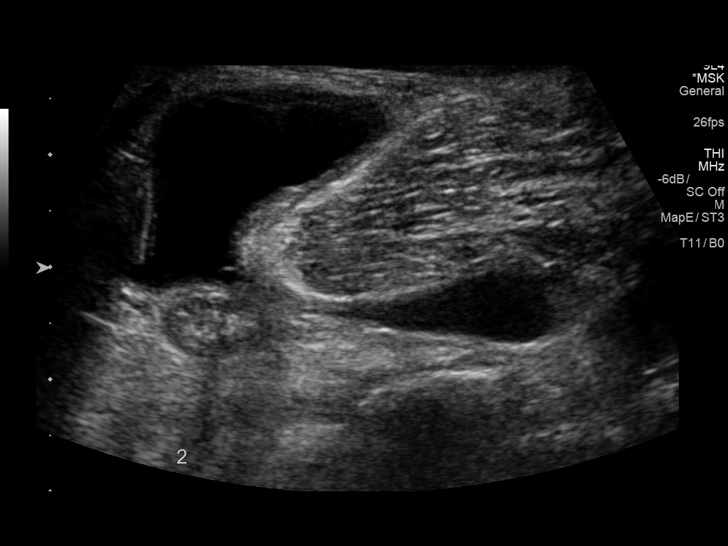
[im 13/21]
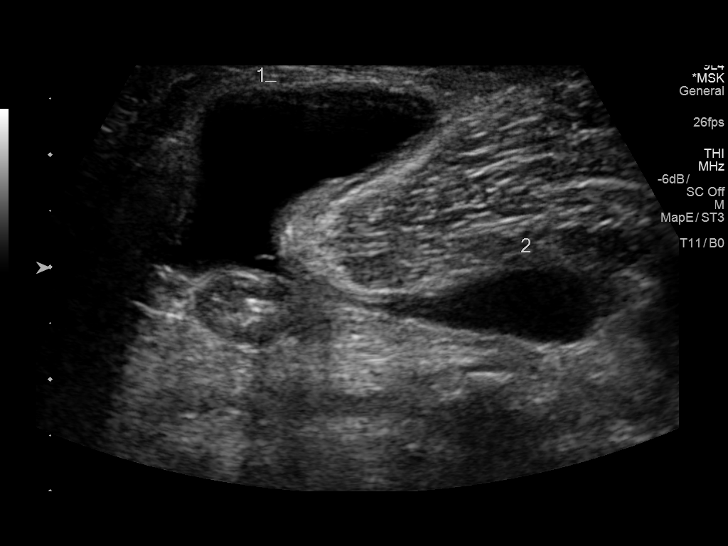
[im 15/21]
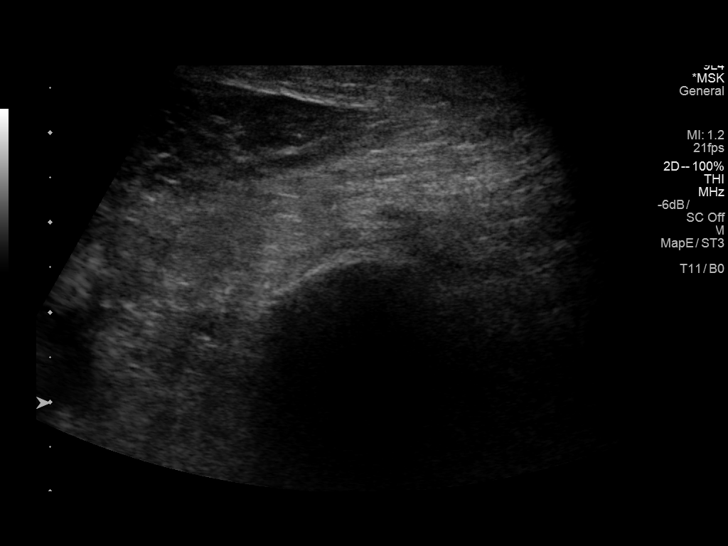
[im 16/21]
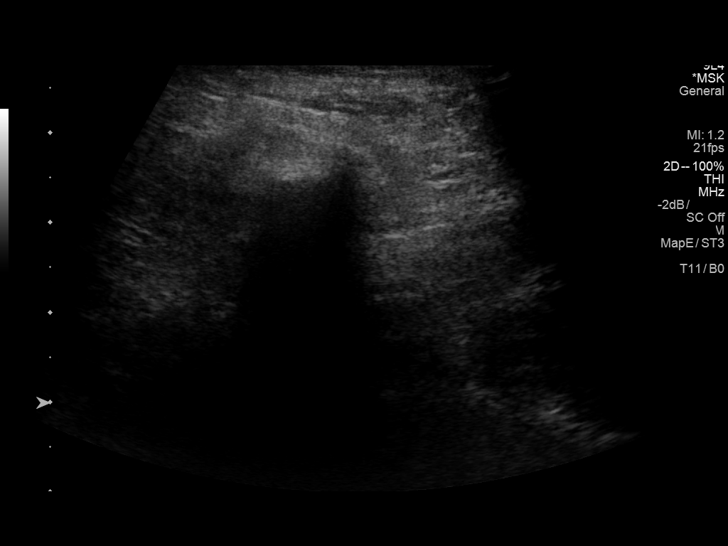
[im 18/21]
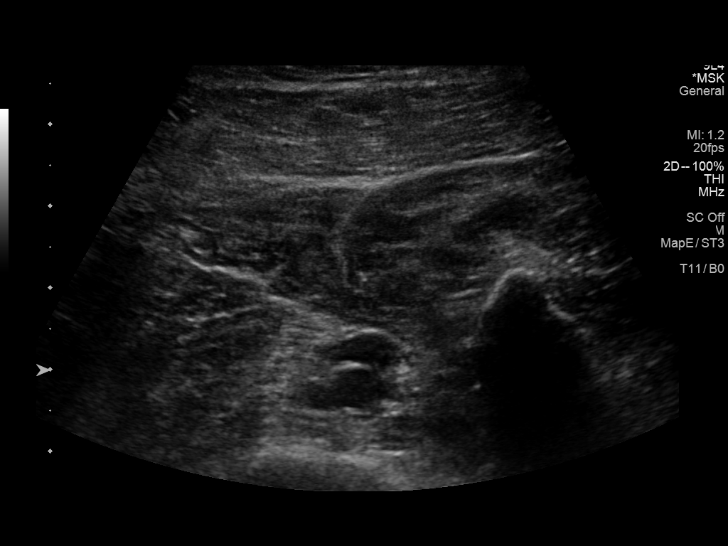
[im 19/21]
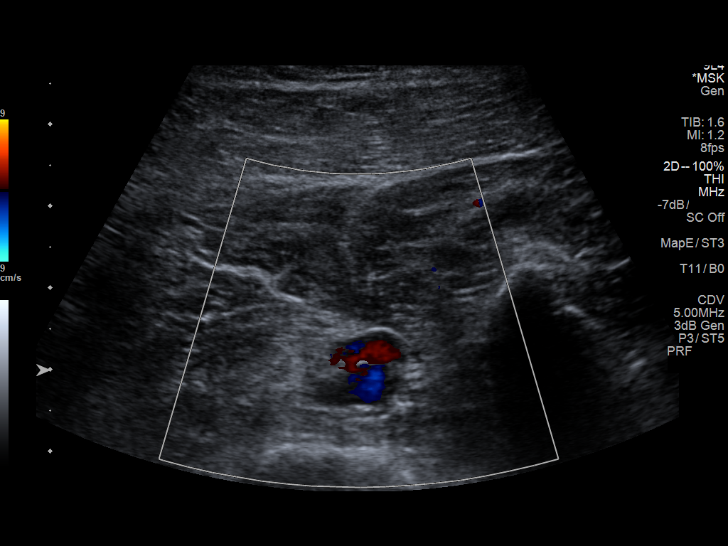
[im 21/21]
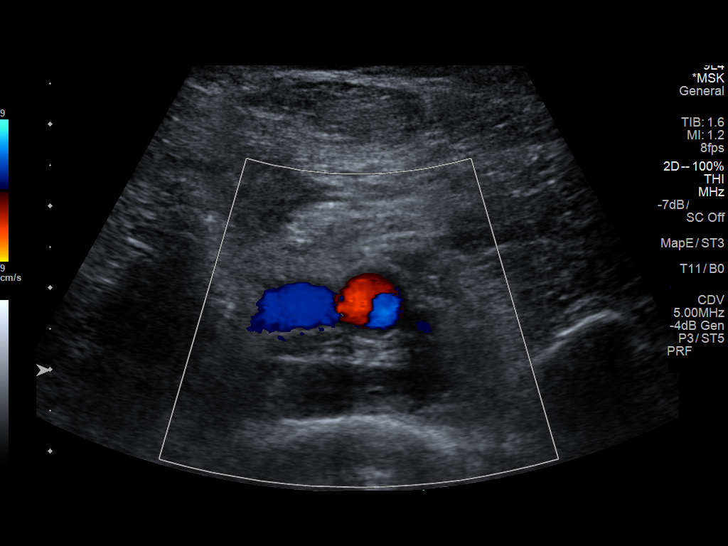

[14 of 21 positions shown; findings below may reference images not displayed]

FINDINGS: Scanning in the area of clinical concern shows 2 likely
communicating cystic lesions. More superior and posterior of these
has a somewhat bilobed appearance measuring 7.8 x 2.5 cm in greatest
dimension. This second area measures 4.5 cm in greatest dimension.
This is consistent with a complex multifocal popliteal cyst.
IMPRESSION: Multifocal popliteal cyst.

## 2019-04-06 ENCOUNTER — Ambulatory Visit: Payer: Medicare Other | Attending: Internal Medicine

## 2019-04-06 DIAGNOSIS — Z23 Encounter for immunization: Secondary | ICD-10-CM | POA: Insufficient documentation

## 2019-04-06 NOTE — Progress Notes (Signed)
   Covid-19 Vaccination Clinic  Name:  CORNEILUS HEGGIE    MRN: [id number] DOB: [birth year]  04/06/2019  Mr. Vento was observed post Covid-19 immunization for 30 minutes based on pre-vaccination screening without incidence. He was provided with Vaccine Information Sheet and instruction to access the V-Safe system.   Mr. Keirsey was instructed to call 911 with any severe reactions post vaccine: [name] Kitchen Difficulty breathing  . Swelling of your face and throat  . A fast heartbeat  . A bad rash all over your body  . Dizziness and weakness    Immunizations Administered    Name Date Dose VIS Date Route   Pfizer COVID-19 Vaccine 04/06/2019  2:36 PM 0.3 mL 03/08/2019 Intramuscular   Manufacturer: [employer], [employer]   Lot: [id number]   NDC: [address]-7

## 2019-04-10 ENCOUNTER — Ambulatory Visit: Payer: Medicare Other

## 2019-04-26 ENCOUNTER — Ambulatory Visit: Payer: Self-pay

## 2019-04-27 ENCOUNTER — Ambulatory Visit: Payer: Medicare Other | Attending: Internal Medicine

## 2019-04-27 DIAGNOSIS — Z23 Encounter for immunization: Secondary | ICD-10-CM

## 2019-04-27 NOTE — Progress Notes (Signed)
   Covid-19 Vaccination Clinic  Name:  Jeremy Holmes    MRN: [id number] DOB: [birth year]  04/27/2019  Jeremy Holmes was observed post Covid-19 immunization for 15 minutes without incidence. He was provided with Vaccine Information Sheet and instruction to access the V-Safe system.   Jeremy Holmes was instructed to call 911 with any severe reactions post vaccine: [name] Kitchen Difficulty breathing  . Swelling of your face and throat  . A fast heartbeat  . A bad rash all over your body  . Dizziness and weakness    Immunizations Administered    Name Date Dose VIS Date Route   Pfizer COVID-19 Vaccine 04/27/2019 10:34 AM 0.3 mL 03/08/2019 Intramuscular   Manufacturer: [employer], [employer]   Lot: [id number]   NDC: [address]-6

## 2019-07-25 ENCOUNTER — Telehealth: Payer: Self-pay | Admitting: Orthopaedic Surgery

## 2019-07-25 NOTE — Telephone Encounter (Signed)
Called and worked in for next week. Patient made aware that it may be a wait due to working in.

## 2019-07-25 NOTE — Telephone Encounter (Signed)
Patient called to get an appointment with Dr. Cleophas Dunker due to having a Baker's cyst behind his right knee.  Patient was advised that Dr. Hoy Register next available appointment was on May 11th.  He is wanting to know if there is anyway that he can be seen sooner.  [phone].  Thank you.

## 2019-07-30 ENCOUNTER — Ambulatory Visit: Payer: Medicare Other | Admitting: Orthopaedic Surgery

## 2019-10-15 ENCOUNTER — Ambulatory Visit: Payer: Medicare Other | Admitting: Orthopaedic Surgery

## 2019-10-15 ENCOUNTER — Other Ambulatory Visit: Payer: Self-pay

## 2019-10-15 ENCOUNTER — Ambulatory Visit: Payer: Self-pay

## 2019-10-15 ENCOUNTER — Encounter: Payer: Self-pay | Admitting: Orthopaedic Surgery

## 2019-10-15 VITALS — Ht 68.0 in | Wt 150.0 lb

## 2019-10-15 DIAGNOSIS — G8929 Other chronic pain: Secondary | ICD-10-CM | POA: Diagnosis not present

## 2019-10-15 DIAGNOSIS — M7121 Synovial cyst of popliteal space [Baker], right knee: Secondary | ICD-10-CM | POA: Insufficient documentation

## 2019-10-15 DIAGNOSIS — M1711 Unilateral primary osteoarthritis, right knee: Secondary | ICD-10-CM | POA: Insufficient documentation

## 2019-10-15 DIAGNOSIS — M25561 Pain in right knee: Secondary | ICD-10-CM | POA: Diagnosis not present

## 2019-10-15 MED ORDER — BUPIVACAINE HCL 0.25 % IJ SOLN
2.0000 mL | INTRAMUSCULAR | Status: AC | PRN
  Administered 2019-10-15: 2 mL via INTRA_ARTICULAR

## 2019-10-15 MED ORDER — LIDOCAINE HCL 1 % IJ SOLN
2.0000 mL | INTRAMUSCULAR | Status: AC | PRN
  Administered 2019-10-15: 2 mL

## 2019-10-15 MED ORDER — METHYLPREDNISOLONE ACETATE 40 MG/ML IJ SUSP
80.0000 mg | INTRAMUSCULAR | Status: AC | PRN
  Administered 2019-10-15: 80 mg via INTRA_ARTICULAR

## 2019-10-15 NOTE — Progress Notes (Signed)
Office Visit Note   Patient: Jeremy Holmes           Date of Birth: 08/04/1933           MRN: [id number] Visit Date: 10/15/2019              Requested by: [name], MD [address] [place],  [place] [address] PCP: [name], MD   Assessment & Plan: Visit Diagnoses:  1. Chronic pain of right knee   2. Unilateral primary osteoarthritis, right knee   3. Baker cyst, right     Plan:  #1: At this time we are going to inject the right knee and that was accomplished atraumatically. #2: Take it easy with his knee today and use ice if indicated #3: Follow back up as needed  Follow-Up Instructions: Return if symptoms worsen or fail to improve.   Orders:  Orders Placed This Encounter  Procedures  . XR KNEE 3 VIEW RIGHT   No orders of the defined types were placed in this encounter.     Procedures: Large Joint Inj: R knee on 10/15/2019 2:25 PM Indications: pain and diagnostic evaluation Details: 25 G 1.5 in needle, anteromedial approach  Arthrogram: No  Medications: 2 mL lidocaine 1 %; 80 mg methylPREDNISolone acetate 40 MG/ML; 2 mL bupivacaine 0.25 % Procedure, treatment alternatives, risks and benefits explained, specific risks discussed. Consent was given by the patient. Immediately prior to procedure a time out was called to verify the correct patient, procedure, equipment, support staff and site/side marked as required. Patient was prepped and draped in the usual sterile fashion.       Clinical Data: No additional findings.   Subjective: Chief Complaint  Patient presents with  . Right Knee - Pain   HPI Patient presents today for right knee pain. He said that two years he had a swollen area behind his knee, but it went away. The swollen area returned about a month ago, along with recurrent pain. His pain is located anteriorly. No grinding. He states that his knee feels weak. No known injury. No previous knee surgery. He rubs AsperCream on his knee, and  aspirin if needed. He received a cortisone injection in September of 2019 and states that it really helped.  Seen today for evaluation.   Review of Systems  Constitutional: Negative for fatigue.  HENT: Negative for ear pain.   Eyes: Negative for pain.  Respiratory: Negative for shortness of breath.   Cardiovascular: Negative for leg swelling.  Gastrointestinal: Negative for constipation and diarrhea.  Endocrine: Negative for cold intolerance and heat intolerance.  Genitourinary: Negative for difficulty urinating.  Musculoskeletal: Positive for joint swelling.  Skin: Negative for rash.  Allergic/Immunologic: Negative for food allergies.  Neurological: Negative for weakness.  Hematological: Does not bruise/bleed easily.  Psychiatric/Behavioral: Negative for sleep disturbance.     Objective: Vital Signs: Ht 5\' 8"  (1.727 m)   Wt 150 lb (68 kg)   BMI 22.81 kg/m   Physical Exam Constitutional:      Appearance: Normal appearance. He is well-developed and normal weight.  HENT:     Head: Normocephalic.  Eyes:     Pupils: Pupils are equal, round, and reactive to light.  Pulmonary:     Effort: Pulmonary effort is normal.  Skin:    General: Skin is warm and dry.  Neurological:     Mental Status: He is oriented to person, place, and time.  Psychiatric:  Behavior: Behavior normal.     Ortho Exam  Exam today reveals the need to have minimal effusion anteriorly.  He does have palpable Baker's cyst posteriorly.  Not particular painful to palpation.  He has near full extension.  Flexes to about 120 degrees easily.  Ligamentously stable.  Calf supple.  Specialty Comments:  No specialty comments available.  Imaging: XR KNEE 3 VIEW RIGHT  Result Date: 10/15/2019 Three-view x-ray of the right knee reveals possible CPPD medially.  He does have some mild spurring at the medial proximal tibia and distal femur.  He also has peaked intercondylar spine more prominent on the lateral 1  than on the medial patellofemoral joint does show some spurring on the patella.  Also some calcification of the insertion of the quadriceps of the superior pole the patella.  He overall is maintaining his joint spaces.    PMFS History: Current Outpatient Medications  Medication Sig Dispense Refill  . amitriptyline (ELAVIL) 50 MG tablet Take 1 tablet (50 mg total) by mouth at bedtime. 30 tablet 0  . Ascorbic Acid (VITAMIN C WITH ROSE HIPS) 1000 MG tablet Take 1,000 mg by mouth daily.    [name] Kitchen aspirin 81 MG chewable tablet Chew by mouth daily.    [name] Kitchen atorvastatin (LIPITOR) 20 MG tablet Take 20 mg by mouth daily.    [name] Kitchen CINNAMON PO Take 1,000 mg by mouth.    . clonazePAM (KLONOPIN) 0.5 MG tablet Take 1 tablet (0.5 mg total) by mouth 3 (three) times daily as needed for anxiety. 21 tablet 0  . diclofenac sodium (VOLTAREN) 1 % GEL Apply 2-4 g topically 4 (four) times daily. 3 Tube 2  . Multiple Vitamins-Minerals (CENTRUM SILVER PO) Take by mouth.    . Omega-3 Fatty Acids (FISH OIL) 1200 MG CAPS Take by mouth.    . traMADol (ULTRAM) 50 MG tablet Take 50 mg by mouth at bedtime.  0   No current facility-administered medications for this visit.    Patient Active Problem List   Diagnosis Date Noted  . Unilateral primary osteoarthritis, right knee 10/15/2019  . Baker cyst, right 10/15/2019  . Generalized anxiety disorder 06/07/2014  . Adjustment disorder with depressed mood 06/07/2014   History reviewed. No pertinent past medical history.  History reviewed. No pertinent family history.  Past Surgical History:  Procedure Laterality Date  . ELBOW SURGERY    . SHOULDER SURGERY     Social History   Occupational History  . Not on file  Tobacco Use  . Smoking status: Former Games developer  . Smokeless tobacco: Never Used  Vaping Use  . Vaping Use: Never used  Substance and Sexual Activity  . Alcohol use: No  . Drug use: No  . Sexual activity: Not on file

## 2020-01-28 ENCOUNTER — Ambulatory Visit: Payer: Medicare Other | Admitting: Orthopaedic Surgery

## 2020-02-05 ENCOUNTER — Ambulatory Visit: Payer: Medicare Other | Admitting: Orthopaedic Surgery

## 2020-02-05 ENCOUNTER — Encounter: Payer: Self-pay | Admitting: Orthopaedic Surgery

## 2020-02-05 ENCOUNTER — Other Ambulatory Visit: Payer: Self-pay

## 2020-02-05 VITALS — Ht 68.0 in | Wt 150.0 lb

## 2020-02-05 DIAGNOSIS — M1711 Unilateral primary osteoarthritis, right knee: Secondary | ICD-10-CM

## 2020-02-05 DIAGNOSIS — K4090 Unilateral inguinal hernia, without obstruction or gangrene, not specified as recurrent: Secondary | ICD-10-CM

## 2020-02-05 DIAGNOSIS — K429 Umbilical hernia without obstruction or gangrene: Secondary | ICD-10-CM

## 2020-02-05 MED ORDER — BUPIVACAINE HCL 0.5 % IJ SOLN
2.0000 mL | INTRAMUSCULAR | Status: AC | PRN
  Administered 2020-02-05: 2 mL via INTRA_ARTICULAR

## 2020-02-05 MED ORDER — LIDOCAINE HCL 1 % IJ SOLN
2.0000 mL | INTRAMUSCULAR | Status: AC | PRN
  Administered 2020-02-05: 2 mL

## 2020-02-05 MED ORDER — METHYLPREDNISOLONE ACETATE 40 MG/ML IJ SUSP
80.0000 mg | INTRAMUSCULAR | Status: AC | PRN
  Administered 2020-02-05: 80 mg via INTRA_ARTICULAR

## 2020-02-05 NOTE — Progress Notes (Signed)
Office Visit Note   Patient: Jeremy Holmes           Date of Birth: [birth year]           MRN: [id number] Visit Date: 02/05/2020              Requested by: Jeremy Screws, MD [address] [place],  [place] [address] PCP: Jeremy Screws, MD   Assessment & Plan: Visit Diagnoses:  1. Umbilical hernia without obstruction and without gangrene   2. Unilateral inguinal hernia without obstruction or gangrene, recurrence not specified   3. Unilateral primary osteoarthritis, right knee     Plan: Mr. Jeremy Holmes has osteoarthritis of the right knee associated with a Baker's cyst.  I tried to aspirate the Baker's cyst and retrieved less than a cc of somewhat bloody fluid.  I then injected the medial joint line with cortisone in hopes that it will shrink the Baker's cyst.  He also has developed an inguinal and umbilical hernia and will refer him to the general surgeons  Follow-Up Instructions: Return if symptoms worsen or fail to improve.   Orders:  Orders Placed This Encounter  Procedures   Large Joint Inj: R knee   Ambulatory referral to General Surgery   No orders of the defined types were placed in this encounter.     Procedures: Large Joint Inj: R knee on 02/05/2020 2:01 PM Indications: pain and diagnostic evaluation Details: 25 G 1.5 in needle, anteromedial approach  Arthrogram: No  Medications: 2 mL lidocaine 1 %; 2 mL bupivacaine 0.5 %; 80 mg methylPREDNISolone acetate 40 MG/ML Procedure, treatment alternatives, risks and benefits explained, specific risks discussed. Consent was given by the patient. Immediately prior to procedure a time out was called to verify the correct patient, procedure, equipment, support staff and site/side marked as required. Patient was prepped and draped in the usual sterile fashion.       Clinical Data: No additional findings.   Subjective: Chief Complaint  Patient presents with   Right Knee - Pain  Patient presents today for recurrent  right knee pain. He was here last in July and received a cortisone injection. He states that it did help, but he is having posterior and lateral knee pain again. His pain is worse with prolonged sitting. He does not take anything for pain.  Prior films of the right knee have been obtained and were consistent with osteoarthritis.  There are osteophytes in both compartments with some narrowing of the joint space  HPI  Review of Systems   Objective: Vital Signs: Ht 5\' 8"  (1.727 m)    Wt 150 lb (68 kg)    BMI 22.81 kg/m   Physical Exam Constitutional:      Appearance: He is well-developed.  Eyes:     Pupils: Pupils are equal, round, and reactive to light.  Pulmonary:     Effort: Pulmonary effort is normal.  Skin:    General: Skin is warm and dry.  Neurological:     Mental Status: He is alert and oriented to person, place, and time.  Psychiatric:        Behavior: Behavior normal.     Ortho Exam right knee was not hot red warm or swollen.  He does have a palpable Baker's cyst but no calf pain or distal edema.  Full extension flexion over 100 degrees without instability.  Has a little bit of anterior medial joint pain.  I attempted aspiration of the Baker's cyst  but was unable to retrieve any significant fluid so I injected the knee with cortisone Specialty Comments:  No specialty comments available.  Imaging: No results found.   PMFS History: Patient Active Problem List   Diagnosis Date Noted   Unilateral primary osteoarthritis, right knee 10/15/2019   Baker cyst, right 10/15/2019   Generalized anxiety disorder 06/07/2014   Adjustment disorder with depressed mood 06/07/2014   History reviewed. No pertinent past medical history.  History reviewed. No pertinent family history.  Past Surgical History:  Procedure Laterality Date   ELBOW SURGERY     SHOULDER SURGERY     Social History   Occupational History   Not on file  Tobacco Use   Smoking status: Former Smoker    Smokeless tobacco: Never Used  Building services engineer Use: Never used  Substance and Sexual Activity   Alcohol use: No   Drug use: No   Sexual activity: Not on file

## 2020-02-07 ENCOUNTER — Telehealth: Payer: Self-pay | Admitting: Orthopaedic Surgery

## 2020-02-07 NOTE — Telephone Encounter (Signed)
Patient called for an update if referral was sent for pt to see another doctor. Patient is asking for a call back from [facility]. Patient phone number is [phone].

## 2020-02-11 NOTE — Telephone Encounter (Signed)
Called and spoke with patient. I told him that the referral was sent last week. They are still reviewing referral. I gave him the number to contact [facility] so that they can advise him on how much longer before he hears from them for scheduling.

## 2020-03-12 ENCOUNTER — Encounter: Payer: Self-pay | Admitting: Orthopaedic Surgery

## 2020-03-12 ENCOUNTER — Ambulatory Visit: Payer: Self-pay

## 2020-03-12 ENCOUNTER — Other Ambulatory Visit: Payer: Self-pay

## 2020-03-12 ENCOUNTER — Ambulatory Visit: Payer: Medicare Other | Admitting: Orthopaedic Surgery

## 2020-03-12 VITALS — Ht 68.0 in | Wt 150.0 lb

## 2020-03-12 DIAGNOSIS — M25572 Pain in left ankle and joints of left foot: Secondary | ICD-10-CM

## 2020-03-12 DIAGNOSIS — M2142 Flat foot [pes planus] (acquired), left foot: Secondary | ICD-10-CM | POA: Insufficient documentation

## 2020-03-12 DIAGNOSIS — M1711 Unilateral primary osteoarthritis, right knee: Secondary | ICD-10-CM

## 2020-03-12 NOTE — Progress Notes (Signed)
Office Visit Note   Patient: Jeremy Holmes           Date of Birth: 10/27/1933           MRN: [id number] Visit Date: 03/12/2020              Requested by: Jeremy Holmes [address] [place],  [place] [address] PCP: Jeremy Holmes   Assessment & Plan: Visit Diagnoses:  1. Pain in left ankle and joints of left foot   2. Unilateral primary osteoarthritis, right knee   3. Pes planus of left foot     Plan: Jeremy Holmes has osteoarthritis of his right knee with associated CPPD.  I saw him a month ago and injected the knee and he notes that it made a difference but he had an episode of repeat knee swelling and pain.  With ice and heat limited activity this resolved.  Long discussion regarding what he may expect over time with possible acute episodes of the CPPD.  Today there is no effusion no local tenderness and excellent range of motion.  He does have a popliteal cyst but that is not on comfortable.  He also has an issue with his left ankle.  He has severe pronation but the posterior tib tendon appears to be intact there is no swelling or pain along the course of the tendon.  He does have some some hypertrophy about the medial malleolus and on x-ray there is an area of ectopic calcification consistent with an old myositis or ossicle probably from an old ankle sprain.  No obvious arthritis of the ankle and he has excellent range of motion.  Will apply bilateral full innersoles.  Plan to see him back as needed  Follow-Up Instructions: Return if symptoms worsen or fail to improve.   Orders:  Orders Placed This Encounter  Procedures  . XR Ankle Complete Left   No orders of the defined types were placed in this encounter.     Procedures: No procedures performed   Clinical Data: No additional findings.   Subjective: Chief Complaint  Patient presents with  . Right Knee - Pain  . Left Ankle - Pain  Patient presents today for follow up on his right knee. He had his knee  injected in November. He has been doing well. Last week he states that after playing golf he had pain throughout his knee. He has iced, rested, and elevated since then. His knee seems to have improved some, but still aches anteriorly and posteriorly.  He also states that he developed left ankle pain 3-4days ago. No known injury. He has pain and swelling medially. He is not taking anything for pain. No previous ankle surgery.   HPI  Review of Systems   Objective: Vital Signs: Ht 5\' 8"  (1.727 m)   Wt 150 lb (68 kg)   BMI 22.81 kg/m   Physical Exam Constitutional:      Appearance: He is well-developed and well-nourished.  HENT:     Mouth/Throat:     Mouth: Oropharynx is clear and moist.  Eyes:     Extraocular Movements: EOM normal.     Pupils: Pupils are equal, round, and reactive to light.  Pulmonary:     Effort: Pulmonary effort is normal.  Skin:    General: Skin is warm and dry.  Neurological:     Mental Status: He is alert and oriented to person, place, and time.  Psychiatric:  Mood and Affect: Mood and affect normal.        Behavior: Behavior normal.     Ortho Exam awake alert and oriented x3.  Comfortable sitting.  Right knee without effusion or increased heat or warmth.  Full extension flexed over 115 degrees without instability.  Does have a popliteal cyst but not uncomfortable.  No distal edema.  No localized areas of tenderness.  Left ankle with pronation.  Does have severe pes planus but no local tenderness over the posterior tibial tendon or swelling.  There is some hypertrophic change about the medial malleolus where on x-ray there is an area of ectopic calcification or ossicle probably from an old injury.  Ankle joint appears to be well-maintained.  Minimal loss of ankle motion but certainly is not functional loss.  And no localized areas of tenderness.  Good capillary refill to toes.  Minimal tenderness over the deltoid ligament which could be stretch related to  his pes planus.    Specialty Comments:  No specialty comments available.  Imaging: XR Ankle Complete Left  Result Date: 03/12/2020 Films of the left ankle obtained in several projections.  There is no obvious acute change or fracture.  No ectopic calcification other than an accessory calcification along the medial malleolus that is obviously old.  No evidence of CPPD    PMFS History: Patient Active Problem List   Diagnosis Date Noted  . Pes planus of left foot 03/12/2020  . Unilateral primary osteoarthritis, right knee 10/15/2019  . Baker cyst, right 10/15/2019  . Generalized anxiety disorder 06/07/2014  . Adjustment disorder with depressed mood 06/07/2014   History reviewed. No pertinent past medical history.  History reviewed. No pertinent family history.  Past Surgical History:  Procedure Laterality Date  . ELBOW SURGERY    . SHOULDER SURGERY     Social History   Occupational History  . Not on file  Tobacco Use  . Smoking status: Former Games developer  . Smokeless tobacco: Never Used  Vaping Use  . Vaping Use: Never used  Substance and Sexual Activity  . Alcohol use: No  . Drug use: No  . Sexual activity: Not on file

## 2020-05-27 ENCOUNTER — Other Ambulatory Visit: Payer: Self-pay

## 2020-05-27 ENCOUNTER — Encounter: Payer: Self-pay | Admitting: Orthopaedic Surgery

## 2020-05-27 ENCOUNTER — Ambulatory Visit: Payer: Medicare Other | Admitting: Orthopaedic Surgery

## 2020-05-27 VITALS — Ht 68.0 in | Wt 150.0 lb

## 2020-05-27 DIAGNOSIS — M1711 Unilateral primary osteoarthritis, right knee: Secondary | ICD-10-CM

## 2020-05-27 MED ORDER — BUPIVACAINE HCL 0.25 % IJ SOLN
2.0000 mL | INTRAMUSCULAR | Status: AC | PRN
  Administered 2020-05-27: 2 mL via INTRA_ARTICULAR

## 2020-05-27 MED ORDER — LIDOCAINE HCL 1 % IJ SOLN
2.0000 mL | INTRAMUSCULAR | Status: AC | PRN
  Administered 2020-05-27: 2 mL

## 2020-05-27 NOTE — Addendum Note (Signed)
Addended by: Wendi Maya on: 05/27/2020 02:25 PM   Modules accepted: Orders

## 2020-05-27 NOTE — Progress Notes (Signed)
Office Visit Note   Patient: Jeremy Holmes           Date of Birth: 1934/02/17           MRN: [id number] Visit Date: 05/27/2020              Requested by: Jeremy Screws, MD [address] [place],  [place] [address] PCP: Jeremy Screws, MD   Assessment & Plan: Visit Diagnoses:  1. Unilateral primary osteoarthritis, right knee     Plan: Recurrent pain and swelling right knee.  Last injected about 4 months ago.  X-rays demonstrated some degenerative changes particularly medially but the joint spaces were relatively well-maintained.  We will reaspirate the knee and inject cortisone and obtain an MRI scan to be sure there is no other pathology.  Certainly having compromise of his activities  Follow-Up Instructions: Return After MRI scan right knee.   Orders:  Orders Placed This Encounter  Procedures  . Large Joint Inj: R knee   No orders of the defined types were placed in this encounter.     Procedures: Large Joint Inj: R knee on 05/27/2020 2:06 PM Indications: pain and diagnostic evaluation Details: 25 G 1.5 in needle, anteromedial approach  Arthrogram: No  Medications: 2 mL lidocaine 1 %; 2 mL bupivacaine 0.25 % Aspirate: 35 mL yellow and clear Outcome: tolerated well, no immediate complications  12 mg betamethasone injected at the right knee after aspiration Procedure, treatment alternatives, risks and benefits explained, specific risks discussed. Consent was given by the patient. Immediately prior to procedure a time out was called to verify the correct patient, procedure, equipment, support staff and site/side marked as required. Patient was prepped and draped in the usual sterile fashion.       Clinical Data: No additional findings.   Subjective: Chief Complaint  Patient presents with  . Right Knee - Pain  Patient presents today for recurrent right knee pain. He was last here in November 2021 and received a cortisone injection. Patient states that the  injection helped a little bit. He tried Voltaren gel for a week, but it didn't help him. He states that he gets relief from icing.   HPI  Review of Systems   Objective: Vital Signs: Ht 5\' 8"  (1.727 m)   Wt 150 lb (68 kg)   BMI 22.81 kg/m   Physical Exam Constitutional:      Appearance: He is well-developed and well-nourished.  HENT:     Mouth/Throat:     Mouth: Oropharynx is clear and moist.  Eyes:     Extraocular Movements: EOM normal.     Pupils: Pupils are equal, round, and reactive to light.  Pulmonary:     Effort: Pulmonary effort is normal.  Skin:    General: Skin is warm and dry.  Neurological:     Mental Status: He is alert and oriented to person, place, and time.  Psychiatric:        Mood and Affect: Mood and affect normal.        Behavior: Behavior normal.     Ortho Exam awake alert and oriented x3.  Comfortable sitting.  Right knee with positive effusion.  The knee was not red or particularly warm.  There is a well-established popliteal cyst that was not comfortable and did not pulsate.  Had full extension and about 100 degrees of flexion.  Mostly medial joint pain but relatively mild.  After aspiration he had flexion over 115 degrees and  felt much more comfortable.  Specialty Comments:  No specialty comments available.  Imaging: No results found.   PMFS History: Patient Active Problem List   Diagnosis Date Noted  . Pes planus of left foot 03/12/2020  . Unilateral primary osteoarthritis, right knee 10/15/2019  . Baker cyst, right 10/15/2019  . Generalized anxiety disorder 06/07/2014  . Adjustment disorder with depressed mood 06/07/2014   History reviewed. No pertinent past medical history.  History reviewed. No pertinent family history.  Past Surgical History:  Procedure Laterality Date  . ELBOW SURGERY    . SHOULDER SURGERY     Social History   Occupational History  . Not on file  Tobacco Use  . Smoking status: Former Games developer  . Smokeless  tobacco: Never Used  Vaping Use  . Vaping Use: Never used  Substance and Sexual Activity  . Alcohol use: No  . Drug use: No  . Sexual activity: Not on file

## 2020-06-03 ENCOUNTER — Ambulatory Visit
Admission: RE | Admit: 2020-06-03 | Discharge: 2020-06-03 | Disposition: A | Discharge status: Home / Self Care | Payer: Medicare Other | Source: Ambulatory Visit | Attending: Orthopaedic Surgery | Admitting: Orthopaedic Surgery

## 2020-06-03 ENCOUNTER — Other Ambulatory Visit: Payer: Self-pay

## 2020-06-03 DIAGNOSIS — M1711 Unilateral primary osteoarthritis, right knee: Secondary | ICD-10-CM

## 2020-06-09 ENCOUNTER — Telehealth: Payer: Self-pay

## 2020-06-09 ENCOUNTER — Other Ambulatory Visit: Payer: Self-pay

## 2020-06-09 ENCOUNTER — Encounter: Payer: Self-pay | Admitting: Orthopaedic Surgery

## 2020-06-09 ENCOUNTER — Ambulatory Visit: Payer: Medicare Other | Admitting: Orthopaedic Surgery

## 2020-06-09 DIAGNOSIS — M1711 Unilateral primary osteoarthritis, right knee: Secondary | ICD-10-CM | POA: Diagnosis not present

## 2020-06-09 NOTE — Progress Notes (Signed)
   Office Visit Note   Patient: Jeremy Holmes           Date of Birth: 10/19/1933           MRN: [id number] Visit Date: 06/09/2020              Requested by: [name], MD [address] [place],  [place] [address] PCP: [name], MD   Assessment & Plan: Visit Diagnoses:  1. Unilateral primary osteoarthritis, right knee     Plan: MRI scan of the right knee demonstrated significant degenerative change in the medial compartment and mild degenerative changes in the patellofemoral and lateral compartments.  The medial meniscus was macerated.  There is also maceration of the ACL.  Long discussion regarding the findings.  Not much relief with cortisone.  We will try viscosupplementation.  I do not think he be a good candidate for arthroscopy.  We briefly touched on knee replacement  Follow-Up Instructions: Return Pre-CERT Visco supplementation.   Orders:  No orders of the defined types were placed in this encounter.  No orders of the defined types were placed in this encounter.     Procedures: No procedures performed   Clinical Data: No additional findings.   Subjective: Chief Complaint  Patient presents with  . Right Knee - Pain    MRI review  Patient presents today for follow up on his right knee. He had an MRI performed and is here for those results.  HPI  Review of Systems   Objective: Vital Signs: There were no vitals taken for this visit.  Physical Exam Constitutional:      Appearance: He is well-developed.  Eyes:     Pupils: Pupils are equal, round, and reactive to light.  Pulmonary:     Effort: Pulmonary effort is normal.  Skin:    General: Skin is warm and dry.  Neurological:     Mental Status: He is alert and oriented to person, place, and time.  Psychiatric:        Behavior: Behavior normal.     Ortho Exam awake alert and oriented x3.  Comfortable sitting.  Minimal effusion right knee.  Mostly medial joint pain.  There was some mild  lateral pain and even some patella crepitation.  No instability.  Full extension today and flexed over 100 degrees  Specialty Comments:  No specialty comments available.  Imaging: No results found.   PMFS History: Patient Active Problem List   Diagnosis Date Noted  . Pes planus of left foot 03/12/2020  . Unilateral primary osteoarthritis, right knee 10/15/2019  . Baker cyst, right 10/15/2019  . Generalized anxiety disorder 06/07/2014  . Adjustment disorder with depressed mood 06/07/2014   History reviewed. No pertinent past medical history.  History reviewed. No pertinent family history.  Past Surgical History:  Procedure Laterality Date  . ELBOW SURGERY    . SHOULDER SURGERY     Social History   Occupational History  . Not on file  Tobacco Use  . Smoking status: Former Games developer  . Smokeless tobacco: Never Used  Vaping Use  . Vaping Use: Never used  Substance and Sexual Activity  . Alcohol use: No  . Drug use: No  . Sexual activity: Not on file

## 2020-06-09 NOTE — Telephone Encounter (Signed)
Noted  

## 2020-06-09 NOTE — Telephone Encounter (Signed)
Please precert for right knee visco injections. This is Dr.Whitfield's patient. Thanks!  

## 2020-06-16 ENCOUNTER — Telehealth: Payer: Self-pay | Admitting: Orthopaedic Surgery

## 2020-06-16 NOTE — Telephone Encounter (Signed)
Pt called stating Dr. Cleophas Dunker was supposed to call him to go over a procedure but he hasn't heard anything and his last appt was 06/09/20; pt would like a CB to discuss further.   [phone]

## 2020-06-16 NOTE — Telephone Encounter (Signed)
Spoke with patient. Explained that we are still waiting for approval from insurance.

## 2020-06-16 NOTE — Telephone Encounter (Signed)
I think Jeremy Holmes is referring to visco-call and bring him up to date re  pre approval

## 2020-06-29 ENCOUNTER — Telehealth: Payer: Self-pay | Admitting: Orthopaedic Surgery

## 2020-06-29 NOTE — Telephone Encounter (Signed)
Pt calling and wondering if insurance has been approved. He wants a call back at [phone]

## 2020-06-30 ENCOUNTER — Telehealth: Payer: Self-pay

## 2020-06-30 NOTE — Telephone Encounter (Signed)
VOB submitted for Synvisc, right knee.  Pending BV.

## 2020-07-14 ENCOUNTER — Telehealth: Payer: Self-pay | Admitting: Orthopaedic Surgery

## 2020-07-14 NOTE — Telephone Encounter (Signed)
Called patient. He is wanting to know the status of the injections. He seemed a little frustrated at the amount of time it is taking to get these injections done.

## 2020-07-14 NOTE — Telephone Encounter (Signed)
Patient called requesting a return call from Lauren G. About upcoming procedure he is waiting for. Patient phone number is [phone].

## 2020-07-17 ENCOUNTER — Telehealth: Payer: Self-pay

## 2020-07-17 NOTE — Telephone Encounter (Signed)
Approved for Synvisc series, right knee. Buy & Bill Patient will be responsible for 20% OOP. Co-pay of $25.00 each visit No PA required  Appt. 07/23/2020 with Dr. Cleophas Dunker

## 2020-07-17 NOTE — Telephone Encounter (Signed)
Noted  

## 2020-07-17 NOTE — Telephone Encounter (Signed)
Talked with patient concerning gel injections.  Appts. Scheduled.

## 2020-07-23 ENCOUNTER — Ambulatory Visit: Payer: Medicare Other | Admitting: Orthopaedic Surgery

## 2020-07-23 ENCOUNTER — Encounter: Payer: Self-pay | Admitting: Orthopaedic Surgery

## 2020-07-23 ENCOUNTER — Other Ambulatory Visit: Payer: Self-pay

## 2020-07-23 VITALS — Ht 68.0 in | Wt 150.0 lb

## 2020-07-23 DIAGNOSIS — M1711 Unilateral primary osteoarthritis, right knee: Secondary | ICD-10-CM

## 2020-07-23 MED ORDER — LIDOCAINE HCL 1 % IJ SOLN
2.0000 mL | INTRAMUSCULAR | Status: AC | PRN
Start: 2020-07-23 — End: 2020-07-23
  Administered 2020-07-23: 2 mL

## 2020-07-23 MED ORDER — HYLAN G-F 20 16 MG/2ML IX SOSY
16.0000 mg | PREFILLED_SYRINGE | INTRA_ARTICULAR | Status: AC | PRN
  Administered 2020-07-23: 16 mg via INTRA_ARTICULAR

## 2020-07-23 NOTE — Progress Notes (Signed)
Office Visit Note   Patient: Jeremy Holmes           Date of Birth: [birth year]           MRN: [id number] Visit Date: 07/23/2020              Requested by: [name], MD [address] [place],  [place] [address] PCP: [name], MD   Assessment & Plan: Visit Diagnoses:  1. Unilateral primary osteoarthritis, right knee     Plan: First Synvisc injection right knee performed today.  Will return weekly for the next 2 weeks to complete the series.  I aspirated the knee of about 35 cc of clear yellow fluid and about 25 cc from his popliteal cyst.  Felt much better  Follow-Up Instructions: Return in about 1 week (around 07/30/2020).   Orders:  Orders Placed This Encounter  Procedures  . Large Joint Inj: R knee   No orders of the defined types were placed in this encounter.     Procedures: Large Joint Inj: R knee on 07/23/2020 2:08 PM Indications: pain and joint swelling Details: 25 G 1.5 in needle  Arthrogram: No  Medications: 2 mL lidocaine 1 %; 16 mg Hylan 16 MG/2ML Aspirate: 35 mL clear and yellow Outcome: tolerated well, no immediate complications  Also aspirated approximately 25 cc of clear yellow fluid from a popliteal cyst after alcohol Betadine prep using Xylocaine Procedure, treatment alternatives, risks and benefits explained, specific risks discussed. Consent was given by the patient. Immediately prior to procedure a time out was called to verify the correct patient, procedure, equipment, support staff and site/side marked as required. Patient was prepped and draped in the usual sterile fashion.       Clinical Data: No additional findings.   Subjective: Chief Complaint  Patient presents with  . Right Knee - Follow-up    Synvisc started 07/23/2020  Patient presents today for the first Synvisc injection of the right knee.   HPI  Review of Systems   Objective: Vital Signs: Ht 5\' 8"  (1.727 m)   Wt 150 lb (68 kg)   BMI 22.81 kg/m   Physical  Exam  Ortho Exam right knee with positive effusion.  The knee was not hot warm or red.  He also has a large popliteal cyst.  Motor exam intact lacked a few degrees to full extension and only about 100 degrees of flexion.  After aspiration of the popliteal cyst and the knee was able to fully extend and flex well beyond 105 without instability  Specialty Comments:  No specialty comments available.  Imaging: No results found.   PMFS History: Patient Active Problem List   Diagnosis Date Noted  . Pes planus of left foot 03/12/2020  . Unilateral primary osteoarthritis, right knee 10/15/2019  . Baker cyst, right 10/15/2019  . Generalized anxiety disorder 06/07/2014  . Adjustment disorder with depressed mood 06/07/2014   History reviewed. No pertinent past medical history.  History reviewed. No pertinent family history.  Past Surgical History:  Procedure Laterality Date  . ELBOW SURGERY    . SHOULDER SURGERY     Social History   Occupational History  . Not on file  Tobacco Use  . Smoking status: Former 08/07/2014  . Smokeless tobacco: Never Used  Vaping Use  . Vaping Use: Never used  Substance and Sexual Activity  . Alcohol use: No  . Drug use: No  . Sexual activity: Not on file

## 2020-07-30 ENCOUNTER — Encounter: Payer: Self-pay | Admitting: Orthopaedic Surgery

## 2020-07-30 ENCOUNTER — Ambulatory Visit: Payer: Medicare Other | Admitting: Orthopaedic Surgery

## 2020-07-30 ENCOUNTER — Other Ambulatory Visit: Payer: Self-pay

## 2020-07-30 VITALS — Ht 68.0 in | Wt 150.0 lb

## 2020-07-30 DIAGNOSIS — M1711 Unilateral primary osteoarthritis, right knee: Secondary | ICD-10-CM

## 2020-07-30 MED ORDER — HYLAN G-F 20 16 MG/2ML IX SOSY
16.0000 mg | PREFILLED_SYRINGE | INTRA_ARTICULAR | Status: AC | PRN
  Administered 2020-07-30: 16 mg via INTRA_ARTICULAR

## 2020-07-30 NOTE — Progress Notes (Signed)
   Office Visit Note   Patient: Jeremy Holmes           Date of Birth: 22-Feb-1934           MRN: [id number] Visit Date: 07/30/2020              Requested by: [name], MD [address] [place],  [place] [address] PCP: [name], MD   Assessment & Plan: Visit Diagnoses:  1. Unilateral primary osteoarthritis, right knee     Plan: Second Synvisc injection right knee.  Does have recurrent effusion after aspiration last week.  Not much pain.  Return next week to complete the series of 3  Follow-Up Instructions: Return in about 1 week (around 08/06/2020).   Orders:  Orders Placed This Encounter  Procedures  . Large Joint Inj: R knee   No orders of the defined types were placed in this encounter.     Procedures: Large Joint Inj: R knee on 07/30/2020 2:02 PM Indications: pain and joint swelling Details: 25 G 1.5 in needle  Arthrogram: No  Medications: 16 mg Hylan 16 MG/2ML Outcome: tolerated well, no immediate complications Procedure, treatment alternatives, risks and benefits explained, specific risks discussed. Consent was given by the patient. Immediately prior to procedure a time out was called to verify the correct patient, procedure, equipment, support staff and site/side marked as required. Patient was prepped and draped in the usual sterile fashion.       Clinical Data: No additional findings.   Subjective: Chief Complaint  Patient presents with  . Right Knee - Follow-up    Synvisc started 07/23/2020  Patient presents today for the second Synvisc injection in his right knee. He started the series on 07/23/2020. No complications from the first injection.  HPI  Review of Systems   Objective: Vital Signs: Ht 5\' 8"  (1.727 m)   Wt 150 lb (68 kg)   BMI 22.81 kg/m   Physical Exam  Ortho Exam awake alert and oriented x3.  Comfortable sitting.  Recurrent effusion right knee and a smaller popliteal cyst that was aspirated last week.  No calf pain or  swelling.  Minimal medial joint pain  Specialty Comments:  No specialty comments available.  Imaging: No results found.   PMFS History: Patient Active Problem List   Diagnosis Date Noted  . Pes planus of left foot 03/12/2020  . Unilateral primary osteoarthritis, right knee 10/15/2019  . Baker cyst, right 10/15/2019  . Generalized anxiety disorder 06/07/2014  . Adjustment disorder with depressed mood 06/07/2014   History reviewed. No pertinent past medical history.  History reviewed. No pertinent family history.  Past Surgical History:  Procedure Laterality Date  . ELBOW SURGERY    . SHOULDER SURGERY     Social History   Occupational History  . Not on file  Tobacco Use  . Smoking status: Former 08/07/2014  . Smokeless tobacco: Never Used  Vaping Use  . Vaping Use: Never used  Substance and Sexual Activity  . Alcohol use: No  . Drug use: No  . Sexual activity: Not on file

## 2020-08-06 ENCOUNTER — Encounter: Payer: Self-pay | Admitting: Orthopaedic Surgery

## 2020-08-06 ENCOUNTER — Other Ambulatory Visit: Payer: Self-pay

## 2020-08-06 ENCOUNTER — Ambulatory Visit: Payer: Medicare Other | Admitting: Orthopaedic Surgery

## 2020-08-06 DIAGNOSIS — M1711 Unilateral primary osteoarthritis, right knee: Secondary | ICD-10-CM | POA: Diagnosis not present

## 2020-08-06 MED ORDER — HYLAN G-F 20 16 MG/2ML IX SOSY
16.0000 mg | PREFILLED_SYRINGE | INTRA_ARTICULAR | Status: AC | PRN
Start: 2020-08-06 — End: 2020-08-06
  Administered 2020-08-06: 16 mg via INTRA_ARTICULAR

## 2020-08-06 NOTE — Progress Notes (Signed)
   Office Visit Note   Patient: Jeremy Holmes           Date of Birth: 1933/07/07           MRN: [id number] Visit Date: 08/06/2020              Requested by: [name], MD [address] [place],  [place] [address] PCP: [name], MD   Assessment & Plan: Visit Diagnoses:  1. Unilateral primary osteoarthritis, right knee     Plan: Third and final Synvisc injection right knee.  Feels like he has had some improvement.  Still having an issue with his popliteal cyst and recurrent effusion in the right knee related to the arthritis.  We will consider repeat Baker's cyst aspiration and cortisone injection at some point in the future.  Follow-Up Instructions: Return if symptoms worsen or fail to improve.   Orders:  No orders of the defined types were placed in this encounter.  No orders of the defined types were placed in this encounter.     Procedures: Large Joint Inj: R knee on 08/06/2020 1:58 PM Indications: pain and joint swelling Details: 25 G 1.5 in needle  Arthrogram: No  Medications: 16 mg Hylan 16 MG/2ML Outcome: tolerated well, no immediate complications Procedure, treatment alternatives, risks and benefits explained, specific risks discussed. Consent was given by the patient. Immediately prior to procedure a time out was called to verify the correct patient, procedure, equipment, support staff and site/side marked as required. Patient was prepped and draped in the usual sterile fashion.       Clinical Data: No additional findings.   Subjective: Chief Complaint  Patient presents with  . Right Knee - Pain  No related problems referable to the 2 prior Synvisc injections right knee  HPI  Review of Systems   Objective: Vital Signs: There were no vitals taken for this visit.  Physical Exam  Ortho Exam right knee had an effusion but was not hot red or swollen.  Mostly medial joint pain.  Still has a popliteal cyst that felt just a little smaller than  in the past.  Not red and minimally uncomfortable.  No distal edema  Specialty Comments:  No specialty comments available.  Imaging: No results found.   PMFS History: Patient Active Problem List   Diagnosis Date Noted  . Pes planus of left foot 03/12/2020  . Unilateral primary osteoarthritis, right knee 10/15/2019  . Baker cyst, right 10/15/2019  . Generalized anxiety disorder 06/07/2014  . Adjustment disorder with depressed mood 06/07/2014   History reviewed. No pertinent past medical history.  History reviewed. No pertinent family history.  Past Surgical History:  Procedure Laterality Date  . ELBOW SURGERY    . SHOULDER SURGERY     Social History   Occupational History  . Not on file  Tobacco Use  . Smoking status: Former Games developer  . Smokeless tobacco: Never Used  Vaping Use  . Vaping Use: Never used  Substance and Sexual Activity  . Alcohol use: No  . Drug use: No  . Sexual activity: Not on file     Valeria Batman, MD   Note - This record has been created using [employer].  Chart creation errors have been sought, but may not always  have been located. Such creation errors do not reflect on  the standard of medical care.

## 2020-08-18 ENCOUNTER — Ambulatory Visit: Payer: Medicare Other | Admitting: Physician Assistant

## 2020-08-18 ENCOUNTER — Telehealth: Payer: Self-pay

## 2020-08-18 ENCOUNTER — Encounter: Payer: Self-pay | Admitting: Physician Assistant

## 2020-08-18 DIAGNOSIS — Z961 Presence of intraocular lens: Secondary | ICD-10-CM | POA: Diagnosis not present

## 2020-08-18 DIAGNOSIS — M1711 Unilateral primary osteoarthritis, right knee: Secondary | ICD-10-CM

## 2020-08-18 DIAGNOSIS — H52203 Unspecified astigmatism, bilateral: Secondary | ICD-10-CM | POA: Diagnosis not present

## 2020-08-18 DIAGNOSIS — H524 Presbyopia: Secondary | ICD-10-CM | POA: Diagnosis not present

## 2020-08-18 NOTE — Telephone Encounter (Signed)
Spoke with patient and scheduled him to see [place] this afternoon.

## 2020-08-18 NOTE — Telephone Encounter (Signed)
Patient called stating that his right knee is swollen and that he had received the gel injection and now he is not able to walk or sleep at night.  Would like a CB at [phone] or [phone].  Please advise.  Thank you.

## 2020-08-18 NOTE — Progress Notes (Signed)
Office Visit Note   Patient: Jeremy Holmes           Date of Birth: 1934-03-18           MRN: [id number] Visit Date: 08/18/2020              Requested by: [name], MD [address] [place],  [place] [address] PCP: [name], MD  Chief Complaint  Patient presents with  . Right Knee - Pain    S/p Synvisc injection 08/06/20      HPI: Patient is a pleasant 87 year old gentleman who is a patient of Dr. Hoy Register.  He is extremely active and was given his third Synvisc injection 12 days ago.  He said he had been doing very well and been playing golf.  After playing golf recently in the last couple days he had pain and swelling in his knee.  He has had this in the past and says he has a large Baker's cyst that Dr. Cleophas Dunker has aspirated in the past.  He is wondering if he can have his knee and Baker's cyst aspirated  Assessment & Plan: Visit Diagnoses: No diagnosis found.  Plan: Explained that this would be more appropriate for the Bakers aspiration to be under ultrasound guidance Spoke with Dr Roda Shutters who agreed.  I have told the patient if he can come back tomorrow we would have Dr. Prince Rome do this under ultrasound as well as aspirating his knee.  He is in agreement to this.  Follow-Up Instructions: No follow-ups on file.   Ortho Exam  Patient is alert, oriented, no adenopathy, well-dressed, normal affect, normal respiratory effort. Examination of his right knee he has a palpable large cyst in the posterior knee.  Also some fluid palpable in the knee joint itself.  No erythema no redness no particular pain to palpation no signs of an infective process.  Imaging: No results found. No images are attached to the encounter.  Labs: No results found for: HGBA1C, ESRSEDRATE, CRP, LABURIC, REPTSTATUS, GRAMSTAIN, CULT, LABORGA   No results found for: ALBUMIN, PREALBUMIN, CBC  No results found for: MG No results found for: VD25OH  No results found for: PREALBUMIN No  flowsheet data found.   There is no height or weight on file to calculate BMI.  Orders:  No orders of the defined types were placed in this encounter.  No orders of the defined types were placed in this encounter.    Procedures: No procedures performed  Clinical Data: No additional findings.  ROS:  All other systems negative, except as noted in the HPI. Review of Systems  Objective: Vital Signs: There were no vitals taken for this visit.  Specialty Comments:  No specialty comments available.  PMFS History: Patient Active Problem List   Diagnosis Date Noted  . Pes planus of left foot 03/12/2020  . Unilateral primary osteoarthritis, right knee 10/15/2019  . Baker cyst, right 10/15/2019  . Generalized anxiety disorder 06/07/2014  . Adjustment disorder with depressed mood 06/07/2014   No past medical history on file.  No family history on file.  Past Surgical History:  Procedure Laterality Date  . ELBOW SURGERY    . SHOULDER SURGERY     Social History   Occupational History  . Not on file  Tobacco Use  . Smoking status: Former Games developer  . Smokeless tobacco: Never Used  Vaping Use  . Vaping Use: Never used  Substance and Sexual Activity  . Alcohol use: No  .  Drug use: No  . Sexual activity: Not on file       

## 2020-08-19 ENCOUNTER — Other Ambulatory Visit: Payer: Self-pay

## 2020-08-19 ENCOUNTER — Telehealth: Payer: Self-pay | Admitting: Family Medicine

## 2020-08-19 ENCOUNTER — Encounter: Payer: Self-pay | Admitting: Family Medicine

## 2020-08-19 ENCOUNTER — Ambulatory Visit (INDEPENDENT_AMBULATORY_CARE_PROVIDER_SITE_OTHER): Payer: Medicare Other | Admitting: Family Medicine

## 2020-08-19 ENCOUNTER — Encounter (HOSPITAL_COMMUNITY): Payer: Self-pay | Admitting: Orthopaedic Surgery

## 2020-08-19 DIAGNOSIS — M25561 Pain in right knee: Secondary | ICD-10-CM

## 2020-08-19 DIAGNOSIS — M009 Pyogenic arthritis, unspecified: Secondary | ICD-10-CM

## 2020-08-19 NOTE — Telephone Encounter (Signed)
Synovial fluid has 68,210 nucleated cells with 93% neutrophils.  Notified Dr. Roda Shutters.

## 2020-08-19 NOTE — Progress Notes (Signed)
I saw and examined the patient with Dr. Marga Hoots and agree with assessment and plan as outlined.    He is here for possible aspiration of right knee Baker's cyst.  Examination reveals a tense 3+ effusion with warmth, no erythema.  He has fullness in the popliteal fossa and tenderness there as well.  Aspirated 100 cc of purulent fluid from superolateral approach.  We will send this for stat fluid analysis.  Dr. Roda Shutters is aware.  If it appears to be infection, then he will need the knee washed out.

## 2020-08-19 NOTE — Progress Notes (Signed)
Office Visit Note   Patient: Jeremy Holmes           Date of Birth: 11/24/1933           MRN: [id number] Visit Date: 08/19/2020 Requested by: [name], MD [address] [place],  [place] [address] PCP: [name], MD  Subjective: Chief Complaint  Patient presents with  . Right Knee - Pain, Edema    Bakers cyst right knee    HPI: 87yo M presenting to clinic with 'a few days' of progressive right knee pain and swelling. Patient states that he has had mild swelling in his knee before, but never to this extent. Approximately ten days ago, he underwent a viscosupplementation injection to this knee, and states that he felt fine following this, until the start of these new symptoms. He says he noticed his knee started to feel hot, and he felt chills. His wife didn't think he had an overt fever, so he didn't come in sooner for evaluation. Over the past two days, his knee pain has progressed to the point where he feels that he can barely walk- endorsing significant pain with both flexion and extension. His knee continues to feel warm to the touch, though he denies any obvious redness.               ROS:   No objective fevers, but endorses chills. No nausea/vomiting.   Objective: Vital Signs: There were no vitals taken for this visit.  Physical Exam:  General:  Alert and oriented, in no acute distress. Pulm:  Breathing unlabored. Psy:  Normal mood, congruent affect. Skin:  Right knee with no obvious erythema, bruising, or rashes. Overlying skin intact.   Right knee with tense effusion, extending into the popliteal fossa. Very tender to palpation throughout, though most noticed in the posterior aspect. Knee feels warm when compared to the left, and there is reduction in both extension and flexion due to pain.   Imaging: Limited Extremity US- Right Knee Extensive effusion throughout suprapatellar space as well as within the popliteal fossa. Fluid appears loculated in the posterior  aspect, with surrounding soft tissue edema. Color flow reveals marked increased in doppler signal throughout the anterior and posterior aspect of the knee.   Impression: Large effusion with evidence of significant inflammation  Assessment & Plan: 87yo M presenting to clinic with tense knee effusion, worsening pain, chills following a viscosupplementation injection approximately ten days prior. Aspiration was performed, with copious amounts of cloudy yellow fluid pulled from suprapatellar pouch.  - Given proximity to previous injection, there is high concern for joint injection.  - Sent for STAT gram stain/Culture - Notified surgeon on call, who will monitor for gram stain results in anticipation of OR Washout if gram stain returns concerning.       Procedures: Right Knee Aspiration:  Risks and benefits of procedure discussed, Patient opted to proceed. Verbal Consent obtained.  Timeout performed.  Skin prepped in a sterile fashion with betadine before further cleansing with alcohol. Ethyl Chloride was used for topical analgesia.  Superficial wheal was made in the area of suprapatellar pouch, which was then was injected with 3cc 1% Lidocaine without epinephrine using a 25G, 1.5in needle. Following anesthetic application, approximately 120cc of cloudy yellow fluid was aspirated through an 18G needle.  Sample of fluid collected for laboratory analysis, and courier was called for STAT lab transport.   Patient tolerated the Aspiration well with no immediate complications.  PMFS History: Patient Active Problem List   Diagnosis Date Noted  . Pes planus of left foot 03/12/2020  . Unilateral primary osteoarthritis, right knee 10/15/2019  . Baker cyst, right 10/15/2019  . Generalized anxiety disorder 06/07/2014  . Adjustment disorder with depressed mood 06/07/2014   History reviewed. No pertinent past medical history.  History reviewed. No pertinent family history.  Past Surgical  History:  Procedure Laterality Date  . ELBOW SURGERY    . SHOULDER SURGERY     Social History   Occupational History  . Not on file  Tobacco Use  . Smoking status: Former Games developer  . Smokeless tobacco: Never Used  Vaping Use  . Vaping Use: Never used  Substance and Sexual Activity  . Alcohol use: No  . Drug use: No  . Sexual activity: Not on file

## 2020-08-19 NOTE — Progress Notes (Signed)
PCP - Dr [name] Cardiologist - n/a  Chest x-ray - n/a EKG - n/a Stress Test - n/a ECHO - n/a Cardiac Cath - n/a  Sleep Study -  n/a CPAP - n/a  STOP now taking any Aspirin (unless otherwise instructed by your surgeon), Aleve, Naproxen, Ibuprofen, Motrin, Advil, Goody's, BC's, all herbal medications, fish oil, and all vitamins.   Coronavirus Screening Covid test on DOS. Do you have any of the following symptoms:  Cough yes/no: No Fever (>100.65F)  yes/no: No Runny nose yes/no: No Sore throat yes/no: No Difficulty breathing/shortness of breath  yes/no: No  Have you traveled in the last 14 days and where? yes/no: No  Patient verbalized understanding of instructions that were given via phone.

## 2020-08-20 ENCOUNTER — Inpatient Hospital Stay (HOSPITAL_COMMUNITY): Payer: Medicare Other | Admitting: Anesthesiology

## 2020-08-20 ENCOUNTER — Inpatient Hospital Stay (HOSPITAL_COMMUNITY)
Admission: RE | Admit: 2020-08-20 | Discharge: 2020-08-25 | DRG: 487 | Disposition: A | Discharge status: Home Health | Payer: Medicare Other | Source: Ambulatory Visit | Attending: Orthopaedic Surgery | Admitting: Orthopaedic Surgery

## 2020-08-20 ENCOUNTER — Encounter (HOSPITAL_COMMUNITY)
Admission: RE | Disposition: A | Discharge status: Home Health | Payer: Self-pay | Source: Ambulatory Visit | Attending: Orthopaedic Surgery

## 2020-08-20 DIAGNOSIS — R531 Weakness: Secondary | ICD-10-CM | POA: Diagnosis not present

## 2020-08-20 DIAGNOSIS — Z79899 Other long term (current) drug therapy: Secondary | ICD-10-CM | POA: Diagnosis not present

## 2020-08-20 DIAGNOSIS — M659 Synovitis and tenosynovitis, unspecified: Secondary | ICD-10-CM | POA: Diagnosis present

## 2020-08-20 DIAGNOSIS — M009 Pyogenic arthritis, unspecified: Principal | ICD-10-CM

## 2020-08-20 DIAGNOSIS — Z9841 Cataract extraction status, right eye: Secondary | ICD-10-CM

## 2020-08-20 DIAGNOSIS — Z20822 Contact with and (suspected) exposure to covid-19: Secondary | ICD-10-CM | POA: Diagnosis not present

## 2020-08-20 DIAGNOSIS — Z87891 Personal history of nicotine dependence: Secondary | ICD-10-CM | POA: Diagnosis not present

## 2020-08-20 DIAGNOSIS — Z9842 Cataract extraction status, left eye: Secondary | ICD-10-CM

## 2020-08-20 DIAGNOSIS — M1711 Unilateral primary osteoarthritis, right knee: Secondary | ICD-10-CM | POA: Diagnosis present

## 2020-08-20 DIAGNOSIS — E785 Hyperlipidemia, unspecified: Secondary | ICD-10-CM | POA: Diagnosis present

## 2020-08-20 DIAGNOSIS — Z7982 Long term (current) use of aspirin: Secondary | ICD-10-CM | POA: Diagnosis not present

## 2020-08-20 DIAGNOSIS — F418 Other specified anxiety disorders: Secondary | ICD-10-CM | POA: Diagnosis not present

## 2020-08-20 DIAGNOSIS — E119 Type 2 diabetes mellitus without complications: Secondary | ICD-10-CM | POA: Diagnosis not present

## 2020-08-20 HISTORY — DX: Anxiety disorder, unspecified: F41.9

## 2020-08-20 HISTORY — DX: Unspecified osteoarthritis, unspecified site: M19.90

## 2020-08-20 HISTORY — DX: Prediabetes: R73.03

## 2020-08-20 HISTORY — DX: Depression, unspecified: F32.A

## 2020-08-20 HISTORY — PX: KNEE ARTHROSCOPY: SHX127

## 2020-08-20 HISTORY — DX: Hyperlipidemia, unspecified: E78.5

## 2020-08-20 LAB — BASIC METABOLIC PANEL
Anion gap: 8 (ref 5–15)
BUN: 16 mg/dL (ref 8–23)
CO2: 26 mmol/L (ref 22–32)
Calcium: 9 mg/dL (ref 8.9–10.3)
Chloride: 104 mmol/L (ref 98–111)
Creatinine, Ser: 1.04 mg/dL (ref 0.61–1.24)
GFR, Estimated: >60 mL/min (ref >60)
Glucose, Bld: 135 mg/dL — ABNORMAL HIGH (ref 70–99)
Potassium: 3.6 mmol/L (ref 3.5–5.1)
Sodium: 138 mmol/L (ref 135–145)

## 2020-08-20 LAB — CBC
HCT: 40.4 % (ref 39.0–52.0)
Hemoglobin: 13.6 g/dL (ref 13.0–17.0)
MCH: 33.3 pg (ref 26.0–34.0)
MCHC: 33.7 g/dL (ref 30.0–36.0)
MCV: 99 fL (ref 80.0–100.0)
Platelets: 248 10*3/uL (ref 150–400)
RBC: 4.08 MIL/uL — ABNORMAL LOW (ref 4.22–5.81)
RDW: 12.4 % (ref 11.5–15.5)
WBC: 11.3 10*3/uL — ABNORMAL HIGH (ref 4.0–10.5)
nRBC: 0 % (ref 0.0–0.2)

## 2020-08-20 LAB — GLUCOSE, CAPILLARY
Glucose-Capillary: 132 mg/dL — ABNORMAL HIGH (ref 70–99)
Glucose-Capillary: 143 mg/dL — ABNORMAL HIGH (ref 70–99)

## 2020-08-20 LAB — SARS CORONAVIRUS 2 BY RT PCR (HOSPITAL ORDER, PERFORMED IN ~~LOC~~ HOSPITAL LAB): SARS Coronavirus 2: NEGATIVE

## 2020-08-20 SURGERY — ARTHROSCOPY, KNEE
Anesthesia: General | Site: Knee | Laterality: Right

## 2020-08-20 MED ORDER — PHENYLEPHRINE 40 MCG/ML (10ML) SYRINGE FOR IV PUSH (FOR BLOOD PRESSURE SUPPORT)
PREFILLED_SYRINGE | INTRAVENOUS | Status: AC
  Filled 2020-08-20: qty 20

## 2020-08-20 MED ORDER — HYDROCODONE-ACETAMINOPHEN 7.5-325 MG PO TABS
1.0000 | ORAL_TABLET | ORAL | Status: DC | PRN
  Filled 2020-08-20: qty 2

## 2020-08-20 MED ORDER — FENTANYL CITRATE (PF) 250 MCG/5ML IJ SOLN
INTRAMUSCULAR | Status: AC
  Filled 2020-08-20: qty 5

## 2020-08-20 MED ORDER — ONDANSETRON HCL 4 MG PO TABS: 4.0000 mg | ORAL_TABLET | Freq: Four times a day (QID) | ORAL | Status: DC | PRN

## 2020-08-20 MED ORDER — MAGNESIUM CITRATE PO SOLN: 1.0000 | Freq: Once | ORAL | Status: DC | PRN

## 2020-08-20 MED ORDER — ACETAMINOPHEN 500 MG PO TABS
1000.0000 mg | ORAL_TABLET | Freq: Four times a day (QID) | ORAL | Status: AC
  Administered 2020-08-20 – 2020-08-21 (×3): 1000 mg via ORAL
  Filled 2020-08-20 (×3): qty 2

## 2020-08-20 MED ORDER — ACETAMINOPHEN 325 MG PO TABS
325.0000 mg | ORAL_TABLET | Freq: Four times a day (QID) | ORAL | Status: DC | PRN
Start: 2020-08-21 — End: 2020-08-25
  Administered 2020-08-24: 650 mg via ORAL
  Filled 2020-08-20: qty 2

## 2020-08-20 MED ORDER — ONDANSETRON HCL 4 MG/2ML IJ SOLN
INTRAMUSCULAR | Status: AC
  Filled 2020-08-20: qty 2

## 2020-08-20 MED ORDER — ONDANSETRON HCL 4 MG/2ML IJ SOLN: 4.0000 mg | Freq: Four times a day (QID) | INTRAMUSCULAR | Status: DC | PRN

## 2020-08-20 MED ORDER — LIDOCAINE 2% (20 MG/ML) 5 ML SYRINGE
INTRAMUSCULAR | Status: AC
  Filled 2020-08-20: qty 5

## 2020-08-20 MED ORDER — PROPOFOL 10 MG/ML IV BOLUS
INTRAVENOUS | Status: AC
  Filled 2020-08-20: qty 20

## 2020-08-20 MED ORDER — VANCOMYCIN HCL 1000 MG IV SOLR
INTRAVENOUS | Status: DC | PRN
  Administered 2020-08-20: 1000 mg via INTRAVENOUS

## 2020-08-20 MED ORDER — METHOCARBAMOL 500 MG PO TABS
500.0000 mg | ORAL_TABLET | Freq: Four times a day (QID) | ORAL | Status: DC | PRN
  Administered 2020-08-20 – 2020-08-24 (×4): 500 mg via ORAL
  Filled 2020-08-20 (×4): qty 1

## 2020-08-20 MED ORDER — DIPHENHYDRAMINE HCL 12.5 MG/5ML PO ELIX: 25.0000 mg | ORAL_SOLUTION | ORAL | Status: DC | PRN

## 2020-08-20 MED ORDER — PHENYLEPHRINE 40 MCG/ML (10ML) SYRINGE FOR IV PUSH (FOR BLOOD PRESSURE SUPPORT)
PREFILLED_SYRINGE | INTRAVENOUS | Status: DC | PRN
  Administered 2020-08-20 (×2): 80 ug via INTRAVENOUS
  Administered 2020-08-20: 160 ug via INTRAVENOUS
  Administered 2020-08-20 (×2): 80 ug via INTRAVENOUS

## 2020-08-20 MED ORDER — LIDOCAINE 2% (20 MG/ML) 5 ML SYRINGE
INTRAMUSCULAR | Status: DC | PRN
  Administered 2020-08-20: 40 mg via INTRAVENOUS

## 2020-08-20 MED ORDER — CHLORHEXIDINE GLUCONATE 0.12 % MT SOLN
OROMUCOSAL | Status: AC
  Administered 2020-08-20: 15 mL via OROMUCOSAL
  Filled 2020-08-20: qty 15

## 2020-08-20 MED ORDER — SODIUM CHLORIDE 0.9 % IV SOLN: INTRAVENOUS | Status: DC

## 2020-08-20 MED ORDER — METOCLOPRAMIDE HCL 5 MG PO TABS
5.0000 mg | ORAL_TABLET | Freq: Three times a day (TID) | ORAL | Status: DC | PRN
Start: 2020-08-20 — End: 2020-08-25

## 2020-08-20 MED ORDER — CHLORHEXIDINE GLUCONATE 0.12 % MT SOLN: 15.0000 mL | Freq: Once | OROMUCOSAL | Status: AC

## 2020-08-20 MED ORDER — PROPOFOL 10 MG/ML IV BOLUS
INTRAVENOUS | Status: DC | PRN
  Administered 2020-08-20 (×2): 30 mg via INTRAVENOUS
  Administered 2020-08-20: 70 mg via INTRAVENOUS
  Administered 2020-08-20: 50 mg via INTRAVENOUS
  Administered 2020-08-20: 20 mg via INTRAVENOUS

## 2020-08-20 MED ORDER — LACTATED RINGERS IV SOLN: INTRAVENOUS | Status: DC

## 2020-08-20 MED ORDER — SODIUM CHLORIDE 0.9 % IR SOLN
Status: DC | PRN
  Administered 2020-08-20: 12000 mL

## 2020-08-20 MED ORDER — METHOCARBAMOL 1000 MG/10ML IJ SOLN
500.0000 mg | Freq: Four times a day (QID) | INTRAVENOUS | Status: DC | PRN
  Filled 2020-08-20: qty 5

## 2020-08-20 MED ORDER — MORPHINE SULFATE (PF) 2 MG/ML IV SOLN: 0.5000 mg | INTRAVENOUS | Status: DC | PRN

## 2020-08-20 MED ORDER — VANCOMYCIN HCL 1000 MG/200ML IV SOLN
1000.0000 mg | INTRAVENOUS | Status: DC
  Filled 2020-08-20: qty 200

## 2020-08-20 MED ORDER — ONDANSETRON HCL 4 MG/2ML IJ SOLN
INTRAMUSCULAR | Status: DC | PRN
  Administered 2020-08-20: 4 mg via INTRAVENOUS

## 2020-08-20 MED ORDER — DOCUSATE SODIUM 100 MG PO CAPS
100.0000 mg | ORAL_CAPSULE | Freq: Two times a day (BID) | ORAL | Status: DC
  Administered 2020-08-20 – 2020-08-24 (×7): 100 mg via ORAL
  Filled 2020-08-20 (×8): qty 1

## 2020-08-20 MED ORDER — BUPIVACAINE HCL (PF) 0.25 % IJ SOLN
INTRAMUSCULAR | Status: DC | PRN
  Administered 2020-08-20: 20 mL via INTRA_ARTICULAR

## 2020-08-20 MED ORDER — HYDROCODONE-ACETAMINOPHEN 5-325 MG PO TABS
1.0000 | ORAL_TABLET | ORAL | Status: DC | PRN
  Administered 2020-08-20 (×2): 2 via ORAL
  Administered 2020-08-21: 1 via ORAL
  Administered 2020-08-21: 2 via ORAL
  Administered 2020-08-24 (×2): 1 via ORAL
  Filled 2020-08-20 (×2): qty 1
  Filled 2020-08-20: qty 2
  Filled 2020-08-20 (×2): qty 1
  Filled 2020-08-20 (×2): qty 2

## 2020-08-20 MED ORDER — METOCLOPRAMIDE HCL 5 MG/ML IJ SOLN: 5.0000 mg | Freq: Three times a day (TID) | INTRAMUSCULAR | Status: DC | PRN

## 2020-08-20 MED ORDER — SORBITOL 70 % SOLN
30.0000 mL | Freq: Every day | Status: DC | PRN
  Filled 2020-08-20: qty 30

## 2020-08-20 MED ORDER — TRANEXAMIC ACID-NACL 1000-0.7 MG/100ML-% IV SOLN
1000.0000 mg | Freq: Once | INTRAVENOUS | Status: AC
  Administered 2020-08-20: 1000 mg via INTRAVENOUS
  Filled 2020-08-20: qty 100

## 2020-08-20 MED ORDER — POLYETHYLENE GLYCOL 3350 17 G PO PACK: 17.0000 g | PACK | Freq: Every day | ORAL | Status: DC | PRN

## 2020-08-20 MED ORDER — ORAL CARE MOUTH RINSE: 15.0000 mL | Freq: Once | OROMUCOSAL | Status: AC

## 2020-08-20 MED ORDER — DEXAMETHASONE SODIUM PHOSPHATE 10 MG/ML IJ SOLN
INTRAMUSCULAR | Status: AC
  Filled 2020-08-20: qty 1

## 2020-08-20 MED ORDER — VANCOMYCIN HCL 1250 MG/250ML IV SOLN
1250.0000 mg | INTRAVENOUS | Status: AC
  Administered 2020-08-20: 1250 mg via INTRAVENOUS
  Filled 2020-08-20: qty 250

## 2020-08-20 MED ORDER — FENTANYL CITRATE (PF) 100 MCG/2ML IJ SOLN
INTRAMUSCULAR | Status: DC | PRN
  Administered 2020-08-20: 50 ug via INTRAVENOUS
  Administered 2020-08-20: 25 ug via INTRAVENOUS

## 2020-08-20 SURGICAL SUPPLY — 40 items
BANDAGE ESMARK 6X9 LF (GAUZE/BANDAGES/DRESSINGS) ×1 IMPLANT
BLADE CLIPPER SURG (BLADE) IMPLANT
BLADE EXCALIBUR 4.0X13 (MISCELLANEOUS) IMPLANT
BLADE SURG 11 STRL SS (BLADE) IMPLANT
BNDG ELASTIC 6X10 VLCR STRL LF (GAUZE/BANDAGES/DRESSINGS) ×1 IMPLANT
BNDG ELASTIC 6X5.8 VLCR STR LF (GAUZE/BANDAGES/DRESSINGS) ×2 IMPLANT
BNDG ESMARK 6X9 LF (GAUZE/BANDAGES/DRESSINGS) ×2
COVER SURGICAL LIGHT HANDLE (MISCELLANEOUS) ×2 IMPLANT
COVER WAND RF STERILE (DRAPES) ×2 IMPLANT
CUFF TOURN SGL QUICK 34 (TOURNIQUET CUFF)
CUFF TOURN SGL QUICK 42 (TOURNIQUET CUFF) IMPLANT
CUFF TRNQT CYL 34X4.125X (TOURNIQUET CUFF) IMPLANT
DRAPE ARTHROSCOPY W/POUCH 114 (DRAPES) ×2 IMPLANT
DRAPE SURG 17X23 STRL (DRAPES) ×4 IMPLANT
DRAPE U-SHAPE 47X51 STRL (DRAPES) ×2 IMPLANT
DRSG PAD ABDOMINAL 8X10 ST (GAUZE/BANDAGES/DRESSINGS) ×2 IMPLANT
DURAPREP 26ML APPLICATOR (WOUND CARE) ×2 IMPLANT
ELECT CAUTERY BLADE 6.4 (BLADE) IMPLANT
FACESHIELD WRAPAROUND (MASK) ×2 IMPLANT
FACESHIELD WRAPAROUND OR TEAM (MASK) ×1 IMPLANT
GAUZE SPONGE 4X4 12PLY STRL (GAUZE/BANDAGES/DRESSINGS) ×2 IMPLANT
GAUZE XEROFORM 1X8 LF (GAUZE/BANDAGES/DRESSINGS) ×2 IMPLANT
GLOVE ECLIPSE 7.0 STRL STRAW (GLOVE) ×2 IMPLANT
GLOVE SKINSENSE NS SZ7.5 (GLOVE) ×2
GLOVE SKINSENSE STRL SZ7.5 (GLOVE) ×2 IMPLANT
GLOVE SURG UNDER POLY LF SZ7 (GLOVE) ×2 IMPLANT
GOWN STRL REIN XL XLG (GOWN DISPOSABLE) ×2 IMPLANT
KIT TURNOVER KIT B (KITS) ×2 IMPLANT
MANIFOLD NEPTUNE II (INSTRUMENTS) IMPLANT
NS IRRIG 1000ML POUR BTL (IV SOLUTION) IMPLANT
PACK ARTHROSCOPY DSU (CUSTOM PROCEDURE TRAY) ×2 IMPLANT
PAD ARMBOARD 7.5X6 YLW CONV (MISCELLANEOUS) ×4 IMPLANT
PADDING CAST COTTON 6X4 STRL (CAST SUPPLIES) ×2 IMPLANT
PORT APPOLLO RF 90DEGREE MULTI (SURGICAL WAND) IMPLANT
SPONGE LAP 4X18 RFD (DISPOSABLE) ×2 IMPLANT
SUT ETHILON 3 0 PS 1 (SUTURE) ×2 IMPLANT
TOWEL GREEN STERILE (TOWEL DISPOSABLE) ×2 IMPLANT
TOWEL GREEN STERILE FF (TOWEL DISPOSABLE) ×2 IMPLANT
TUBING ARTHROSCOPY IRRIG 16FT (MISCELLANEOUS) ×2 IMPLANT
WATER STERILE IRR 1000ML POUR (IV SOLUTION) ×2 IMPLANT

## 2020-08-20 NOTE — Transfer of Care (Signed)
Immediate Anesthesia Transfer of Care Note  Patient: Jeremy Holmes  Procedure(s) Performed: ARTHROSCOPY KNEE AND IRRIGATION AND DEBRIDEMENT (Right Knee)  Patient Location: PACU  Anesthesia Type:General  Level of Consciousness: drowsy  Airway & Oxygen Therapy: Patient Spontanous Breathing  Post-op Assessment: Report given to RN  Post vital signs: Reviewed and stable  Last Vitals:  Vitals Value Taken Time  BP 149/60 08/20/20 1501  Temp    Pulse 71 08/20/20 1502  Resp 21 08/20/20 1502  SpO2 100 % 08/20/20 1502  Vitals shown include unvalidated device data.  Last Pain:  Vitals:   08/20/20 1050  TempSrc:   PainSc: 3       Patients Stated Pain Goal: 2 (08/20/20 1050)  Complications: No complications documented.

## 2020-08-20 NOTE — Anesthesia Postprocedure Evaluation (Signed)
Anesthesia Post Note  Patient: Jeremy Holmes  Procedure(s) Performed: ARTHROSCOPY KNEE AND IRRIGATION AND DEBRIDEMENT (Right Knee)     Patient location during evaluation: PACU Anesthesia Type: General Level of consciousness: awake and alert Pain management: pain level controlled Vital Signs Assessment: post-procedure vital signs reviewed and stable Respiratory status: spontaneous breathing, nonlabored ventilation, respiratory function stable and patient connected to nasal cannula oxygen Cardiovascular status: blood pressure returned to baseline and stable Postop Assessment: no apparent nausea or vomiting Anesthetic complications: no   No complications documented.  Last Vitals:  Vitals:   08/20/20 1530 08/20/20 1552  BP: (!) 142/63 (!) 152/63  Pulse: 68 64  Resp: 20 18  Temp: 37.2 C 36.6 C  SpO2: 100% 99%    Last Pain:  Vitals:   08/20/20 1600  TempSrc:   PainSc: 7                  Harvest Deist L Sihaam Chrobak

## 2020-08-20 NOTE — Anesthesia Preprocedure Evaluation (Addendum)
Anesthesia Evaluation  Patient identified by MRN, date of birth, ID band Patient awake    Reviewed: Allergy & Precautions, NPO status , Patient's Chart, lab work & pertinent test results  Airway Mallampati: III  TM Distance: >3 FB Neck ROM: Full    Dental  (+) Edentulous Upper Lower bridge:   Pulmonary neg pulmonary ROS, former smoker,    Pulmonary exam normal breath sounds clear to auscultation       Cardiovascular negative cardio ROS Normal cardiovascular exam Rhythm:Regular Rate:Normal     Neuro/Psych PSYCHIATRIC DISORDERS Anxiety Depression negative neurological ROS     GI/Hepatic negative GI ROS, Neg liver ROS,   Endo/Other  diabetes, Well Controlled, Type 2  Renal/GU negative Renal ROS  negative genitourinary   Musculoskeletal  (+) Arthritis ,   Abdominal   Peds  Hematology negative hematology ROS (+)   Anesthesia Other Findings   Reproductive/Obstetrics                            Anesthesia Physical Anesthesia Plan  ASA: II  Anesthesia Plan: General   Post-op Pain Management:    Induction: Intravenous  PONV Risk Score and Plan: 2 and Ondansetron, Dexamethasone and Treatment may vary due to age or medical condition  Airway Management Planned: LMA  Additional Equipment:   Intra-op Plan:   Post-operative Plan: Extubation in OR  Informed Consent: I have reviewed the patients History and Physical, chart, labs and discussed the procedure including the risks, benefits and alternatives for the proposed anesthesia with the patient or authorized representative who has indicated his/her understanding and acceptance.     Dental advisory given  Plan Discussed with: CRNA  Anesthesia Plan Comments:        Anesthesia Quick Evaluation

## 2020-08-20 NOTE — Progress Notes (Signed)
Pharmacy Antibiotic Note  Jeremy Holmes is a 87 y.o. male admitted on 08/20/2020 with septic knee.  Pharmacy has been consulted for Vancomycin dosing.  PMH: anxiety, arthritis, depression, HLD, pre-DM  ID: R knee septic arthritis/synovitis s/p I&D 5/26.  - Afebrile, WBC 11.3. Scr 1.04  Vanco 5/26>>   Plan: Vanco 1250mg  IV x 1 then Vancomycin 1000 mg IV Q 24 hrs. Goal AUC 400-550. Expected AUC: 525 SCr used: 1.04    Height: 5\' 8"  (172.7 cm) Weight: 65.8 kg (145 lb) IBW/kg (Calculated) : 68.4  Temp (24hrs), Avg:98.5 F (36.9 C), Min:97.8 F (36.6 C), Max:98.9 F (37.2 C)  Recent Labs  Lab 08/20/20 1034  WBC 11.3*  CREATININE 1.04    Estimated Creatinine Clearance: 49.2 mL/min (by C-G formula based on SCr of 1.04 mg/dL).    No Known Allergies  . Jeremy Martine S. , PharmD, BCPS Clinical Staff Pharmacist [email or web address] 08/22/20 08/20/2020 4:08 PM

## 2020-08-20 NOTE — H&P (Signed)
PREOPERATIVE H&P  Chief Complaint: SEPTIC ARTHRITIS RIGHT KNEE  HPI: Jeremy Holmes is a 87 y.o. male who presents for surgical treatment of SEPTIC ARTHRITIS.  He denies any changes in medical history.  Past Medical History:  Diagnosis Date  . Anxiety    no meds  . Arthritis    right knee  . Depression    no meds  . HLD (hyperlipidemia)   . Pre-diabetes    diet controlled - no meds   Past Surgical History:  Procedure Laterality Date  . COLONOSCOPY    . ELBOW SURGERY Right   . EYE SURGERY Bilateral    cataracts surgery  . SHOULDER SURGERY Bilateral    x 2  . WISDOM TOOTH EXTRACTION     Social History   Socioeconomic History  . Marital status: Married    Spouse name: Not on file  . Number of children: Not on file  . Years of education: Not on file  . Highest education level: Not on file  Occupational History  . Not on file  Tobacco Use  . Smoking status: Former Smoker    Types: Cigarettes  . Smokeless tobacco: Never Used  Vaping Use  . Vaping Use: Never used  Substance and Sexual Activity  . Alcohol use: Yes    Alcohol/week: 7.0 standard drinks    Types: 7 Glasses of wine per week    Comment: at night with supper  . Drug use: No  . Sexual activity: Not on file  Other Topics Concern  . Not on file  Social History Narrative  . Not on file   Social Determinants of Health   Financial Resource Strain: Not on file  Food Insecurity: Not on file  Transportation Needs: Not on file  Physical Activity: Not on file  Stress: Not on file  Social Connections: Not on file   History reviewed. No pertinent family history. No Known Allergies Prior to Admission medications   Medication Sig Start Date End Date Taking? Authorizing Provider  amitriptyline (ELAVIL) 50 MG tablet Take 1 tablet (50 mg total) by mouth at bedtime. 06/07/14   Charm Rings, NP  Ascorbic Acid (VITAMIN C WITH ROSE HIPS) 1000 MG tablet Take 1,000 mg by mouth daily.    [provider]  aspirin 81 MG chewable tablet Chew by mouth daily.    [provider]  atorvastatin (LIPITOR) 20 MG tablet Take 20 mg by mouth daily.    [provider]  CINNAMON PO Take 1,000 mg by mouth.    [provider]  clonazePAM (KLONOPIN) 0.5 MG tablet Take 1 tablet (0.5 mg total) by mouth 3 (three) times daily as needed for anxiety. 06/07/14   Charm Rings, NP  diclofenac sodium (VOLTAREN) 1 % GEL Apply 2-4 g topically 4 (four) times daily. 07/11/17   Valeria Batman, MD  Multiple Vitamins-Minerals (CENTRUM SILVER PO) Take by mouth.    [provider]  Omega-3 Fatty Acids (FISH OIL) 1200 MG CAPS Take by mouth.    [provider]  traMADol (ULTRAM) 50 MG tablet Take 50 mg by mouth at bedtime. 12/14/17   [provider]     Positive ROS: All other systems have been reviewed and were otherwise negative with the exception of those mentioned in the HPI and as above.  Physical Exam: General: Alert, no acute distress Cardiovascular: No pedal edema Respiratory: No cyanosis, no use of accessory musculature GI: abdomen soft Skin: No lesions in  the area of chief complaint Neurologic: Sensation intact distally Psychiatric: Patient is competent for consent with normal mood and affect Lymphatic: no lymphedema  MUSCULOSKELETAL: exam stable  Assessment: SEPTIC ARTHRITIS  Plan: Plan for Procedure(s): ARTHROSCOPY KNEE AND IRRIGATION AND DEBRIDEMENT  The risks benefits and alternatives were discussed with the patient including but not limited to the risks of nonoperative treatment, versus surgical intervention including infection, bleeding, nerve injury,  blood clots, cardiopulmonary complications, morbidity, mortality, among others, and they were willing to proceed.   Preoperative templating of the joint replacement has been completed, documented, and submitted to the Operating Room personnel in order to optimize intra-operative equipment  management.   Glee Arvin, MD 08/20/2020 7:07 AM

## 2020-08-20 NOTE — Plan of Care (Signed)

## 2020-08-20 NOTE — H&P (Signed)

## 2020-08-20 NOTE — Progress Notes (Signed)
I called Jeremy Holmes yesterday afternoon and explained that the knee aspirate was indicative of infection and septic arthritis.  My recommendation is for arthroscopic I&D and admission to hospital for IV antibiotics directed by ID team.  He is currently afebrile and nonseptic and this process has been evolving over the last 10 days therefore I thought it was fine to wait until the next day to perform the I&D.  I instructed him to be NPO after midnight and expect a phone call from either short stay or my office with additional instructions.  All questions answered.

## 2020-08-20 NOTE — Op Note (Signed)
   Surgery Date: 08/20/2020  PREOPERATIVE DIAGNOSES:  1. Right knee septic arthritis 2. Right knee synovitis  POSTOPERATIVE DIAGNOSES:  same  PROCEDURES PERFORMED:  1. Right knee arthroscopy with major synovectomy 2. Right knee arthroscopy with arthroscopic irrigation and debridement 3. Aspiration of right knee joint  SURGEON: N. Glee Arvin, M.D.  ASSIST: Starlyn Skeans [place], [place]; necessary for the timely completion of procedure and due to complexity of procedure.  ANESTHESIA:  general  FLUIDS: Per anesthesia record.   ESTIMATED BLOOD LOSS: minimal  DESCRIPTION OF PROCEDURE: Mr. Liptak is a 87 y.o.-year-old male with above mentioned conditions. Full discussion held regarding risks benefits alternatives and complications related surgical intervention. Conservative care options reviewed. All questions answered.  The patient was identified in the preoperative holding area and the operative extremity was marked. The patient was brought to the operating room and transferred to operating table in a supine position. Satisfactory general anesthesia was induced by anesthesiology.    The right knee joint effusion was first aspirate from the superolateral portal which yielded thick purulent fluid which totaled about 80 cc.  This was sent for culture.  Vancomycin was then administered.   Standard anterolateral, anteromedial arthroscopy portals were obtained. The anteromedial portal was obtained with a spinal needle for localization under direct visualization with subsequent diagnostic findings.   Once in the knee joint, there was proliferative synovitis throughout the knee joint.  Major synovectomy was performed with an oscillating shaver in all compartments of the knee.  There was grade 4 changes throughout the medial compartment.  After synovectomy, the knee joint was lavaged with 12 L of normal saline.  A medium hemovac was placed in the joint and brought out superolaterally.   Incisions  were closed with interrupted nylon sutures.  Sterile dressings were applied.  Patient tolerated procedure well had no immediate complications.  Suprapatellar pouch and gutters: severe synovitis or debris. Patella chondral surface: Grade 2 Trochlear chondral surface: Grade 2 Patellofemoral tracking: normal Medial meniscus: prior meniscectomy.  Medial femoral condyle weight bearing surface: Grade 4 Medial tibial plateau: Grade 4 Anterior cruciate ligament:stable Posterior cruciate ligament:stable Lateral meniscus: normal.   Lateral femoral condyle weight bearing surface: Grade 1 Lateral tibial plateau: Grade 1  DISPOSITION: The patient was awakened from general anesthetic, extubated, taken to the recovery room in medically stable condition, no apparent complications. The patient may be weightbearing as tolerated to the operative lower extremity.  Range of motion of right knee as tolerated.  Mayra Reel, MD [facility] 2:39 PM

## 2020-08-20 NOTE — Anesthesia Procedure Notes (Signed)
Procedure Name: LMA Insertion Date/Time: 08/20/2020 2:59 PM Performed by: Pearson Grippe, CRNA Pre-anesthesia Checklist: Patient identified, Emergency Drugs available, Suction available and Patient being monitored Patient Re-evaluated:Patient Re-evaluated prior to induction Oxygen Delivery Method: Circle system utilized Preoxygenation: Pre-oxygenation with 100% oxygen Induction Type: IV induction Ventilation: Mask ventilation without difficulty LMA: LMA inserted LMA Size: 4.0 Number of attempts: 1 Airway Equipment and Method: Bite block Placement Confirmation: positive ETCO2 Tube secured with: Tape Dental Injury: Teeth and Oropharynx as per pre-operative assessment

## 2020-08-20 NOTE — Plan of Care (Signed)

## 2020-08-20 NOTE — H&P (Signed)
PREOPERATIVE H&P  Chief Complaint: SEPTIC ARTHRITIS  HPI: Jeremy Holmes is a 87 y.o. male who presents for surgical treatment of SEPTIC ARTHRITIS.  He denies any changes in medical history.  Past Medical History:  Diagnosis Date  . Anxiety    no meds  . Arthritis    right knee  . Depression    no meds  . HLD (hyperlipidemia)   . Pre-diabetes    diet controlled - no meds   Past Surgical History:  Procedure Laterality Date  . COLONOSCOPY    . ELBOW SURGERY Right   . EYE SURGERY Bilateral    cataracts surgery  . SHOULDER SURGERY Bilateral    x 2  . WISDOM TOOTH EXTRACTION     Social History   Socioeconomic History  . Marital status: Married    Spouse name: Not on file  . Number of children: Not on file  . Years of education: Not on file  . Highest education level: Not on file  Occupational History  . Not on file  Tobacco Use  . Smoking status: Former Smoker    Types: Cigarettes  . Smokeless tobacco: Never Used  Vaping Use  . Vaping Use: Never used  Substance and Sexual Activity  . Alcohol use: Yes    Alcohol/week: 7.0 standard drinks    Types: 7 Glasses of wine per week    Comment: at night with supper  . Drug use: No  . Sexual activity: Not on file  Other Topics Concern  . Not on file  Social History Narrative  . Not on file   Social Determinants of Health   Financial Resource Strain: Not on file  Food Insecurity: Not on file  Transportation Needs: Not on file  Physical Activity: Not on file  Stress: Not on file  Social Connections: Not on file   History reviewed. No pertinent family history. No Known Allergies Prior to Admission medications   Medication Sig Start Date End Date Taking? Authorizing Provider  Ascorbic Acid (VITAMIN C WITH ROSE HIPS) 1000 MG tablet Take 1,000 mg by mouth daily.   Yes [provider]  aspirin 81 MG chewable tablet Chew by mouth daily.   Yes [provider]  atorvastatin (LIPITOR) 20 MG tablet  Take 20 mg by mouth daily.   Yes [provider]  CINNAMON PO Take 1,000 mg by mouth.   Yes [provider]  Multiple Vitamins-Minerals (CENTRUM SILVER PO) Take by mouth.   Yes [provider]  Omega-3 Fatty Acids (FISH OIL) 1200 MG CAPS Take by mouth.   Yes [provider]  amitriptyline (ELAVIL) 50 MG tablet Take 1 tablet (50 mg total) by mouth at bedtime. 06/07/14   Charm Rings, NP  clonazePAM (KLONOPIN) 0.5 MG tablet Take 1 tablet (0.5 mg total) by mouth 3 (three) times daily as needed for anxiety. 06/07/14   Charm Rings, NP  diclofenac sodium (VOLTAREN) 1 % GEL Apply 2-4 g topically 4 (four) times daily. 07/11/17   Valeria Batman, MD  traMADol (ULTRAM) 50 MG tablet Take 50 mg by mouth at bedtime. 12/14/17   [provider]     Positive ROS: All other systems have been reviewed and were otherwise negative with the exception of those mentioned in the HPI and as above.  Physical Exam: General: Alert, no acute distress Cardiovascular: No pedal edema Respiratory: No cyanosis, no use of accessory musculature GI: abdomen soft Skin: No lesions in the area  of chief complaint Neurologic: Sensation intact distally Psychiatric: Patient is competent for consent with normal mood and affect Lymphatic: no lymphedema  MUSCULOSKELETAL: exam stable  Assessment: SEPTIC ARTHRITIS  Plan: Plan for Procedure(s): ARTHROSCOPY KNEE AND IRRIGATION AND DEBRIDEMENT  The risks benefits and alternatives were discussed with the patient including but not limited to the risks of nonoperative treatment, versus surgical intervention including infection, bleeding, nerve injury,  blood clots, cardiopulmonary complications, morbidity, mortality, among others, and they were willing to proceed.   Preoperative templating of the joint replacement has been completed, documented, and submitted to the Operating Room personnel in order to optimize intra-operative equipment  management.   Glee Arvin, MD 08/20/2020 11:34 AM

## 2020-08-21 ENCOUNTER — Encounter (HOSPITAL_COMMUNITY): Payer: Self-pay | Admitting: Orthopaedic Surgery

## 2020-08-21 LAB — CK: Total CK: 83 U/L (ref 49–397)

## 2020-08-21 LAB — GLUCOSE, CAPILLARY
Glucose-Capillary: 111 mg/dL — ABNORMAL HIGH (ref 70–99)
Glucose-Capillary: 149 mg/dL — ABNORMAL HIGH (ref 70–99)

## 2020-08-21 MED ORDER — SODIUM CHLORIDE 0.9 % IV SOLN
8.0000 mg/kg | Freq: Every day | INTRAVENOUS | Status: DC
  Administered 2020-08-21: 550 mg via INTRAVENOUS
  Filled 2020-08-21 (×2): qty 11

## 2020-08-21 MED ORDER — SODIUM CHLORIDE 0.9 % IV SOLN
2.0000 g | INTRAVENOUS | Status: DC
  Administered 2020-08-21 – 2020-08-25 (×5): 2 g via INTRAVENOUS
  Filled 2020-08-21 (×4): qty 20

## 2020-08-21 NOTE — Evaluation (Signed)
Physical Therapy Evaluation Patient Details Name: Jeremy Holmes MRN: [id number] DOB: 08-22-1933 Today's Date: 08/21/2020   History of Present Illness  Pt is 87 yo male admitted with R knee septic arthritis and is s/p R knee major synovectomy and  I and D on 08/20/20.  He has PMH including arthritis, hyperlipidemia, and bil shoulder sx.  Clinical Impression  Pt admitted with above diagnosis. Pt is making excellent progress with good pain control for POD#1.  Pt exceeded PT expectations for mobility and pain control with septic arthritis. He was able to ambulate 250' with RW and supervision and performed flight of steps with pain rated at 2/10.  Did have increased pain with knee flexion but was able to get to 60 degrees flexion.  He is very independent at baseline and was playing golf on Sunday.  Pt with excellent rehab potential.  Pt demonstrates safe mobility to return home but will benefit from acute PT to advance mobility, ROM, and strength while hospitalized. Pt currently with functional limitations due to the deficits listed below (see PT Problem List). Pt will benefit from skilled PT to increase their independence and safety with mobility to allow discharge to the venue listed below.       Follow Up Recommendations No PT follow up;Supervision - Intermittent (may need outpt PT in future but recommend waiting till ortho follow up)    Equipment Recommendations  Rolling walker with 5" wheels    Recommendations for Other Services       Precautions / Restrictions Precautions Precautions: Fall Restrictions Weight Bearing Restrictions: Yes RLE Weight Bearing: Weight bearing as tolerated Other Position/Activity Restrictions: ROM as tolerated      Mobility  Bed Mobility Overal bed mobility: Independent                  Transfers Overall transfer level: Needs assistance Equipment used: None Transfers: Sit to/from Stand Sit to Stand: Supervision         General transfer  comment: Cued for hand placement  Ambulation/Gait Ambulation/Gait assistance: Min guard;Supervision Gait Distance (Feet): 250 Feet Assistive device: Rolling walker (2 wheeled);None Gait Pattern/deviations: Step-through pattern;Decreased stride length Gait velocity: normal   General Gait Details: Cues for RW proximity; min guard progressed to supervision; did ambulate ~10' in room without RW (more antalgic) otherwise used RW  Stairs Stairs: Yes Stairs assistance: Min guard Stair Management: Step to pattern;One rail Right Number of Stairs: 12 General stair comments: cued for sequence but performed without difficulty  Wheelchair Mobility    Modified Rankin (Stroke Patients Only)       Balance Overall balance assessment: Modified Independent   Sitting balance-Leahy Scale: Normal     Standing balance support: No upper extremity supported Standing balance-Leahy Scale: Good Standing balance comment: ambulated with RW for pain control but could take some steps without AD                             Pertinent Vitals/Pain Pain Assessment: 0-10 Pain Score: 3  Pain Location: R knee Pain Descriptors / Indicators: Discomfort;Throbbing Pain Intervention(s): Limited activity within patient's tolerance;Monitored during session    Home Living Family/patient expects to be discharged to:: Private residence Living Arrangements: Spouse/significant other Available Help at Discharge: Family;Available 24 hours/day Type of Home: House Home Access: Stairs to enter Entrance Stairs-Rails: Doctor, general practice of Steps: 2 Home Layout: Two level;Able to live on main level with bedroom/bathroom (Tends to use bathroom upstairs but  can stay down) Home Equipment: Grab bars - tub/shower;Shower seat;Cane - single point      Prior Function Level of Independence: Independent         Comments: Pt completely independent, likes to play golf     Hand Dominance         Extremity/Trunk Assessment   Upper Extremity Assessment Upper Extremity Assessment: Overall WFL for tasks assessed    Lower Extremity Assessment Lower Extremity Assessment: RLE deficits/detail RLE Deficits / Details: Expected post op changes; pt reports significant improvements than pre-op; ROM: ankle and hip WFL, knee 5 to 60 degrees; MMT: ankle 5/5, hip and knee 3/5 not further tested RLE Sensation: WNL    Cervical / Trunk Assessment Cervical / Trunk Assessment: Normal  Communication   Communication: No difficulties  Cognition                                              General Comments General comments (skin integrity, edema, etc.): Pt educated on PT role, POC, safety, WBing status, ROM as tolerated, safe ice use, recommendation for RW for pain control as needed, and HEP -provided the TKA handout as exercises were appropriate.  Also, encouraged resting with leg straight.  Recommended mobilizing every 1-2 hours at home and could ambulate with family or nursing while hospitalized using RW.  Did discuss for HEP that mobilizing important and to do exercise as able but could decrease exercises if pain too high. Discussed recommendation for HEP but does not need HHPT - may need outpt PT if not progressing on own but recommended seeing how he was doing when follows up with ortho in 1-2 weeks.    Exercises Total Joint Exercises Knee Flexion: AROM;Right;5 reps;Seated General Exercises - Lower Extremity Ankle Circles/Pumps: AROM;Both;10 reps;Supine Quad Sets: AROM;Both;5 reps;Supine Long Arc Quad: AROM;Right;5 reps;Seated Heel Slides: AROM;Right;5 reps;Supine   Assessment/Plan    PT Assessment Patient needs continued PT services  PT Problem List Decreased strength;Decreased mobility;Decreased range of motion;Decreased activity tolerance;Decreased balance;Decreased knowledge of use of DME;Pain       PT Treatment Interventions DME instruction;Therapeutic  activities;Modalities;Gait training;Therapeutic exercise;Patient/family education;Stair training;Balance training;Functional mobility training    PT Goals (Current goals can be found in the Care Plan section)  Acute Rehab PT Goals Patient Stated Goal: return home PT Goal Formulation: With patient Time For Goal Achievement: 09/04/20 Potential to Achieve Goals: Good Additional Goals Additional Goal #1: Pt will achieve 0 to 90 degrees R knee ROM for gait and stairs    Frequency Min 5X/week   Barriers to discharge        Co-evaluation               AM-PAC PT "6 Clicks" Mobility  Outcome Measure Help needed turning from your back to your side while in a flat bed without using bedrails?: None Help needed moving from lying on your back to sitting on the side of a flat bed without using bedrails?: None Help needed moving to and from a bed to a chair (including a wheelchair)?: A Little Help needed standing up from a chair using your arms (e.g., wheelchair or bedside chair)?: A Little Help needed to walk in hospital room?: A Little Help needed climbing 3-5 steps with a railing? : A Little 6 Click Score: 20    End of Session   Activity Tolerance: Patient tolerated treatment well  Patient left: in bed;with call bell/phone within reach;with bed alarm set Nurse Communication: Mobility status PT Visit Diagnosis: Other abnormalities of gait and mobility (R26.89);Muscle weakness (generalized) (M62.81)    Time: 7048-8891 PT Time Calculation (min) (ACUTE ONLY): 34 min   Charges:   PT Evaluation $PT Eval Low Complexity: 1 Low PT Treatments $Gait Training: 8-22 mins        Anise Salvo, PT Acute Rehab Services Pager [phone] [facility] [phone]    Rayetta Humphrey 08/21/2020, 3:07 PM

## 2020-08-21 NOTE — TOC Initial Note (Signed)
Transition of Care [facility]) - Initial/Assessment Note    Patient Details  Name: Jeremy Holmes MRN: [id number] Date of Birth: November 01, 1933  Transition of Care [facility] CM/SW Contact:    Kingsley Plan, RN Phone Number: 08/21/2020, 4:16 PM  Clinical Narrative:                 Patient from home with wife.   Discussed IV ABX at home. [facility] will be infusion company, Pam with [facility] will provide teaching prior to discharge. Patient will have a home health RN with [facility] however that nurse will not be present everytime a dose is due.   Patient voiced understanding.  Confirmed face sheet information.   Expected Discharge Plan: Home w Home Health Services     Patient Goals and CMS Choice Patient states their goals for this hospitalization and ongoing recovery are:: to return to home CMS Medicare.gov Compare Post Acute Care list provided to:: Patient Choice offered to / list presented to : Patient  Expected Discharge Plan and Services Expected Discharge Plan: Home w Home Health Services   Discharge Planning Services: CM Consult Post Acute Care Choice: Home Health                   DME Arranged: Walker rolling DME Agency: [employer] Date DME Agency Contacted: 08/21/20 Time DME Agency Contacted: 1615 Representative spoke with at DME Agency: Velna Hatchet HH Arranged: RN HH Agency: [facility] (now [facility]) Date HH Agency Contacted: 08/21/20 Time HH Agency Contacted: 1615 Representative spoke with at [facility] Agency: Stacie  Prior Living Arrangements/Services   Lives with:: Spouse Patient language and need for interpreter reviewed:: Yes        Need for Family Participation in Patient Care: Yes (Comment) Care giver support system in place?: Yes (comment)   Criminal Activity/Legal Involvement Pertinent to Current Situation/Hospitalization: No - Comment as needed  Activities of Daily Living Home Assistive Devices/Equipment: None ADL Screening (condition at  time of admission) Patient's cognitive ability adequate to safely complete daily activities?: Yes Is the patient deaf or have difficulty hearing?: No Does the patient have difficulty seeing, even when wearing glasses/contacts?: No Does the patient have difficulty concentrating, remembering, or making decisions?: No Patient able to express need for assistance with ADLs?: Yes Does the patient have difficulty dressing or bathing?: No Independently performs ADLs?: Yes (appropriate for developmental age) Does the patient have difficulty walking or climbing stairs?: Yes Weakness of Legs: Right Weakness of Arms/Hands: None  Permission Sought/Granted         Permission granted to share info w AGENCY: [facility], [facility]        Emotional Assessment Appearance:: Appears younger than stated age Attitude/Demeanor/Rapport: Engaged Affect (typically observed): Accepting Orientation: : Oriented to Self,Oriented to Place,Oriented to  Time,Oriented to Situation Alcohol / Substance Use: Not Applicable Psych Involvement: No (comment)  Admission diagnosis:  Septic arthritis of knee, right [facility]) [M00.9] Patient Active Problem List   Diagnosis Date Noted  . Septic arthritis of knee, right (HCC) 08/19/2020  . Pes planus of left foot 03/12/2020  . Unilateral primary osteoarthritis, right knee 10/15/2019  . Baker cyst, right 10/15/2019  . Generalized anxiety disorder 06/07/2014  . Adjustment disorder with depressed mood 06/07/2014   PCP:  [name], MD Pharmacy:   [facility] [address] [place] - [address] [place] [address] Phone: [phone] Fax: [phone]     Social Determinants of Health (SDOH) Interventions  Readmission Risk Interventions No flowsheet data found.

## 2020-08-21 NOTE — Progress Notes (Signed)
Dominico is doing fine this morning. Drain output was 80 cc. Soreness with knee ROM. Plan is mobilize with PT today as tolerated. Continue vanc. I will call ID team for consult. Will pull drain when appropriate.  Cultures pending.

## 2020-08-21 NOTE — Progress Notes (Signed)
Patient and staff ambulated around 400 feet this evening on the unit.  Patient tolerated the walk very well.    After his walk, he did request pain medication around 2115.

## 2020-08-21 NOTE — Consult Note (Signed)
[facility]                                                                                        Patient Identification: Patient Name: Jeremy Holmes MRN: [id number] Admit Date: 08/20/2020 10:01 AM Today's Date: 08/21/2020 Reason for consult: septic arthitis Requesting provider: Gershon Mussel   Principal Problem:   Septic arthritis of knee, right (HCC)   Antibiotics: Vancomycin   Lines/Tubes: PIVs, hemovac   Assessment Right knee septic arthritis, native joint In the setting of prior cortisone injections and visco-gel injections Denies being on PO antibiotics prior to admit  S/p right knee arthroscopy with major synovectomy, arthroscopic I&D and aspiration of right knee join on 5/26. OR cultures no growth so far with no growth in gram stain.   Recommendations  Will start  patient on daptomycin and ceftriaxone Follow-up operative cultures on 5/26  and jont aspirate cultures on 5/25 ( no growth so far per DR Roda Shutters) Will plan for 4 weeks of IV antibiotics from date of OR on 5/26 Monitor CBC CMP and CPK OK to place a PICC line A follow up with [facility] has been made with myself on 09/02/20 at 3:45 pm   Dr. Drue Second is on call over the long weekend and follow-up with the cultures and make changes as needed.  Rest of the management as per the primary team. Please call with questions or concerns.  Thank you for the consult  Odette Fraction, MD Infectious Disease Physician [facility] [address] [place], [place] [address] Phone: [phone]  Fax: [phone]  __________________________________________________________________________________________________________ HPI and Hospital Course: 87 year old male with past medical history of right knee arthritis, prediabetes, hyperlipidemia, anxiety and depression who is admitted for I&D of right  knee on 5/27 for concerns of right knee septic arthritis.  Patient states that he has a history of right knee arthritis for which he used to get cortisone injection, last approximately 2 months ago really did not help him with the pain.  He was started on viscous injections later, once every week, 3 times, last in May 12.  He was doing well and was even able to play golf this past Monday.  However he started having pain in his right knee from Tuesday.  Also started having some fever and chills.  Denies having sweats.  Denies nausea vomiting and diarrhea.  Denies chest pain and shortness of breath.  Denies any GU symptoms.  Denies any history of trauma to the right knee, surgeries, joint replacement and placement of hardware.  He denies being on antibiotics prior to coming to the hospital.  He was seen in the office on 5/25 where it was noted that he had a tense right knee effusion with warmth, fullness in the popliteal fossa with tenderness.  Approximately 100 cc of purulent fluid was aspirated from his right knee.  Synovial fluid analysis with 68, 210 nucleated cells with 93% neutrophils.  Patient directly admitted for I&D of right knee.  Patient underwent right knee arthroscopy with major synovectomy, arthroscopic I&D and aspiration of right knee joint.  OR cultures have been sent  On presentation to the hospital, he was afebrile mild leukocytosis of 11.3  ROS: General- Denies loss of appetite and loss of weight HEENT - Denies headache, blurry vision, neck pain, sinus pain Chest - Denies any chest pain, SOB or cough CVS- Denies any dizziness/lightheadedness, syncopal attacks, palpitations Abdomen- Denies any nausea, vomiting, abdominal pain, hematochezia and diarrhea Neuro - Denies any weakness, numbness, tingling sensation Psych - Denies any changes in mood irritability or depressive symptoms GU- Denies any burning, dysuria, hematuria or increased frequency of urination Skin - denies any  rashes/lesions MSK - Rt knee pain and swelling and ROM restricted due to pain   Past Medical History:  Diagnosis Date  . Anxiety    no meds  . Arthritis    right knee  . Depression    no meds  . HLD (hyperlipidemia)   . Pre-diabetes    diet controlled - no meds   Past Surgical History:  Procedure Laterality Date  . COLONOSCOPY    . ELBOW SURGERY Right   . EYE SURGERY Bilateral    cataracts surgery  . KNEE ARTHROSCOPY Right 08/20/2020   Procedure: ARTHROSCOPY KNEE AND IRRIGATION AND DEBRIDEMENT;  Surgeon: Tarry Kos, MD;  Location: [facility] OR;  Service: Orthopedics;  Laterality: Right;  . SHOULDER SURGERY Bilateral    x 2  . WISDOM TOOTH EXTRACTION      Scheduled Meds: . acetaminophen  1,000 mg Oral Q6H  . docusate sodium  100 mg Oral BID   Continuous Infusions: . sodium chloride Stopped (08/21/20 1049)  . methocarbamol (ROBAXIN) IV    . vancomycin     PRN Meds:.acetaminophen, diphenhydrAMINE, HYDROcodone-acetaminophen, HYDROcodone-acetaminophen, magnesium citrate, methocarbamol **OR** methocarbamol (ROBAXIN) IV, metoCLOPramide **OR** metoCLOPramide (REGLAN) injection, morphine injection, ondansetron **OR** ondansetron (ZOFRAN) IV, polyethylene glycol, sorbitol  No Known Allergies  Social History   Socioeconomic History  . Marital status: Married    Spouse name: Not on file  . Number of children: Not on file  . Years of education: Not on file  . Highest education level: Not on file  Occupational History  . Not on file  Tobacco Use  . Smoking status: Former Smoker    Types: Cigarettes  . Smokeless tobacco: Never Used  Vaping Use  . Vaping Use: Never used  Substance and Sexual Activity  . Alcohol use: Yes    Alcohol/week: 7.0 standard drinks    Types: 7 Glasses of wine per week    Comment: at night with supper  . Drug use: No  . Sexual activity: Not on file  Other Topics Concern  . Not on file  Social History Narrative  . Not on file   Social  Determinants of Health   Financial Resource Strain: Not on file  Food Insecurity: Not on file  Transportation Needs: Not on file  Physical Activity: Not on file  Stress: Not on file  Social Connections: Not on file  Intimate Partner Violence: Not on file    Vitals BP (!) 127/55 (BP Location: Right Arm)   Pulse 67   Temp 97.9 F (36.6 C) (Oral)   Resp 17   Ht 5\' 8"  (1.727 m)   Wt 65.8 kg   SpO2 93%   BMI 22.05 kg/m    Physical Exam Constitutional:   Not in acute distress, sitting up in chair    Comments:   Cardiovascular:     Rate and Rhythm: Normal rate and regular rhythm.  Heart sounds: No murmur heard.   Pulmonary:     Effort: Pulmonary effort is normal.     Comments: Not in respiratory distress, clear lung sounds  Abdominal:     Palpations: Abdomen is soft.     Tenderness: Nontender and nondistended  Musculoskeletal:        General: Right knee is wrapped in a surgical bandage with wound VAC  Skin:    Comments: No lesions or rashes  Neurological:     General: No focal deficit present.   Psychiatric:        Mood and Affect: Mood normal.   Pertinent Microbiology Results for orders placed or performed during the hospital encounter of 08/20/20  SARS Coronavirus 2 by RT PCR (hospital order, performed in [facility] hospital lab) Nasopharyngeal Nasopharyngeal Swab     Status: None   Collection Time: 08/20/20 10:12 AM   Specimen: Nasopharyngeal Swab  Result Value Ref Range Status   SARS Coronavirus 2 NEGATIVE NEGATIVE Final    Comment: (NOTE) SARS-CoV-2 target nucleic acids are NOT DETECTED.  The SARS-CoV-2 RNA is generally detectable in upper and lower respiratory specimens during the acute phase of infection. The lowest concentration of SARS-CoV-2 viral copies this assay can detect is 250 copies / mL. A negative result does not preclude SARS-CoV-2 infection and should not be used as the sole basis for treatment or other patient management decisions.   A negative result may occur with improper specimen collection / handling, submission of specimen other than nasopharyngeal swab, presence of viral mutation(s) within the areas targeted by this assay, and inadequate number of viral copies (<250 copies / mL). A negative result must be combined with clinical observations, patient history, and epidemiological information.  Fact Sheet for Patients:   BoilerBrush.com.cy  Fact Sheet for Healthcare Providers: https://pope.com/  This test is not yet approved or  cleared by the [place] FDA and has been authorized for detection and/or diagnosis of SARS-CoV-2 by FDA under an Emergency Use Authorization (EUA).  This EUA will remain in effect (meaning this test can be used) for the duration of the COVID-19 declaration under Section 564(b)(1) of the Act, 21 U.S.C. section 360bbb-3(b)(1), unless the authorization is terminated or revoked sooner.  Performed at [facility], [address], [place], [place] [address]   Aerobic/Anaerobic Culture w Gram Stain (surgical/deep wound)     Status: None (Preliminary result)   Collection Time: 08/20/20  2:50 PM   Specimen: Fluid  Result Value Ref Range Status   Specimen Description FLUID  Final   Special Requests RIGHT KNEE SYNOVIAL FLUID  Final   Gram Stain   Final    MODERATE WBC PRESENT, PREDOMINANTLY PMN NO ORGANISMS SEEN    Culture   Final    NO GROWTH < 24 HOURS Performed at [facility], [address] [place], [place] [address]    Report Status PENDING  Incomplete    Pertinent Lab seen by me: CBC Latest Ref Rng & Units 08/20/2020  WBC 4.0 - 10.5 K/uL 11.3(H)  Hemoglobin 13.0 - 17.0 g/dL 03.4  Hematocrit 74.2 - 52.0 % 40.4  Platelets 150 - 400 K/uL 248   CMP Latest Ref Rng & Units 08/20/2020  Glucose 70 - 99 mg/dL 595(G)  BUN 8 - 23 mg/dL 16  Creatinine 3.87 - 5.64 mg/dL 3.32  Sodium 951 - 884 mmol/L 138  Potassium 3.5 -  5.1 mmol/L 3.6  Chloride 98 - 111 mmol/L 104  CO2 22 -  32 mmol/L 26  Calcium 8.9 - 10.3 mg/dL 9.0  , Pertinent Imagings/Other Imagings Plain films and CT images have been personally visualized and interpreted; radiology reports have been reviewed. Decision making incorporated into the Impression / Recommendations.  I have spent more than 70  minutes for this patient encounter including review of prior medical records with greater than 50% of time being face to face and coordination of their care.  Electronically signed by:   Odette FractionSabina Bayler Gehrig, MD Infectious Disease Physician [facility]  [facility] Pager: [phone]

## 2020-08-22 ENCOUNTER — Inpatient Hospital Stay: Payer: Self-pay

## 2020-08-22 LAB — GLUCOSE, CAPILLARY
Glucose-Capillary: 127 mg/dL — ABNORMAL HIGH (ref 70–99)
Glucose-Capillary: 157 mg/dL — ABNORMAL HIGH (ref 70–99)
Glucose-Capillary: 145 mg/dL — ABNORMAL HIGH (ref 70–99)
Glucose-Capillary: 133 mg/dL — ABNORMAL HIGH (ref 70–99)

## 2020-08-22 MED ORDER — ZOLPIDEM TARTRATE 5 MG PO TABS
5.0000 mg | ORAL_TABLET | Freq: Every evening | ORAL | Status: DC | PRN
  Administered 2020-08-22 – 2020-08-23 (×2): 5 mg via ORAL
  Filled 2020-08-22 (×2): qty 1

## 2020-08-22 MED ORDER — SODIUM CHLORIDE 0.9 % IV SOLN: 2.0000 g | INTRAVENOUS | 1 refills | Status: DC

## 2020-08-22 MED ORDER — VANCOMYCIN HCL 1000 MG/200ML IV SOLN
1000.0000 mg | INTRAVENOUS | Status: DC
  Administered 2020-08-22 – 2020-08-23 (×2): 1000 mg via INTRAVENOUS
  Filled 2020-08-22 (×2): qty 200

## 2020-08-22 MED ORDER — METHOCARBAMOL 500 MG PO TABS: 500.0000 mg | ORAL_TABLET | Freq: Three times a day (TID) | ORAL | 1 refills | Status: DC | PRN

## 2020-08-22 MED ORDER — CEFTRIAXONE IV (FOR PTA / DISCHARGE USE ONLY): 2.0000 g | INTRAVENOUS | 1 refills | Status: DC

## 2020-08-22 MED ORDER — DAPTOMYCIN IV (FOR PTA / DISCHARGE USE ONLY)
550.0000 mg | INTRAVENOUS | 1 refills | Status: DC
Start: 2020-08-22 — End: 2020-08-23

## 2020-08-22 MED ORDER — HYDROCODONE-ACETAMINOPHEN 5-325 MG PO TABS: 1.0000 | ORAL_TABLET | ORAL | 0 refills | Status: DC | PRN

## 2020-08-22 NOTE — Progress Notes (Signed)
PHARMACY CONSULT NOTE FOR:  OUTPATIENT  PARENTERAL ANTIBIOTIC THERAPY (OPAT)  Indication: R knee septic arthritis Regimen: Vancomycin 1000 mg IV q24h, Ceftriaxone 2 gm IV q24h End date: 09/17/2020  IV antibiotic discharge orders are pended. To discharging provider:  please sign these orders via discharge navigator,  Select New Orders & click on the button choice - Manage This Unsigned Work.     Thank you for allowing pharmacy to be a part of this patient's care.  Sherron Monday, PharmD, BCCCP Clinical Pharmacist  Phone: [phone] 08/22/2020 3:21 PM  Please check AMION for all [facility] Pharmacy phone numbers After 10:00 PM, call Main Pharmacy [phone]

## 2020-08-22 NOTE — Progress Notes (Signed)
PHARMACY CONSULT NOTE FOR:  OUTPATIENT  PARENTERAL ANTIBIOTIC THERAPY (OPAT)  Indication: R knee septic arthritis Regimen: Daptomycin 550 mg IV q24h, Ceftriaxone 2 gm IV q24h End date: 09/17/2020  IV antibiotic discharge orders are pended. To discharging provider:  please sign these orders via discharge navigator,  Select New Orders & click on the button choice - Manage This Unsigned Work.     Thank you for allowing pharmacy to be a part of this patient's care.  Lulu Riding, PharmD, BCPS PGY2 Pharmacy Resident Phone between 7 am - 3:30 pm: [phone]  Please check AMION for all [facility] Pharmacy phone numbers After 10:00 PM, call Main Pharmacy [phone]  08/22/2020, 12:47 PM

## 2020-08-22 NOTE — Progress Notes (Signed)
     Subjective: 2 Days Post-Op Procedure(s) (LRB): ARTHROSCOPY KNEE AND IRRIGATION AND DEBRIDEMENT (Right) Awake, alert and oriented x 4. Infectious disease consultant recommends 4 weeks of IV daptomycin 550 mg IV daily and ceftriaxone 2 gm IV daily. Seen yesterday but no orders for PICCU line or HHN yet.   Patient reports pain as mild.    Objective:   VITALS:  Temp:  [98.5 F (36.9 C)-99.5 F (37.5 C)] 98.5 F (36.9 C) (05/28 0745) Pulse Rate:  [71-77] 74 (05/28 0745) Resp:  [15-17] 17 (05/28 0745) BP: (159-169)/(60-66) 167/66 (05/28 0745) SpO2:  [97 %] 97 % (05/28 0745)  Neurologically intact ABD soft Neurovascular intact Sensation intact distally Intact pulses distally Dorsiflexion/Plantar flexion intact Incision: dressing C/D/I and no drainage Compartment soft   LABS Recent Labs    08/20/20 1034  HGB 13.6  WBC 11.3*  PLT 248   Recent Labs    08/20/20 1034  NA 138  K 3.6  CL 104  CO2 26  BUN 16  CREATININE 1.04  GLUCOSE 135*   No results for input(s): LABPT, INR in the last 72 hours.   Assessment/Plan: 2 Days Post-Op Procedure(s) (LRB): ARTHROSCOPY KNEE AND IRRIGATION AND DEBRIDEMENT (Right)  Advance diet Up with therapy Continue ABX therapy due to Post intraarticular injection infection right knee. Discharge home with home health when PICCU line and IV antibiotics and HHN is set up.   Vira Browns 08/22/2020, 11:02 AMPatient ID: Jeremy Holmes, male   DOB: [birth year], 87 y.o.   MRN: [id number]

## 2020-08-22 NOTE — Progress Notes (Signed)
ID PROGRESS NOTE  Patient's insurance will take too long to approve for daptomycin.  Alternatively can do vancomycin plus ceftriaxone for culture negative septic arthritis  New opat orders:  Diagnosis: Septic arthritis  Culture Result: culture negative  No Known Allergies  OPAT Orders Discharge antibiotics to be given via PICC line Discharge antibiotics: Per pharmacy protocol  : vancomycin and ceftriaxone 2gm IV Aim for Vancomycin trough 15-20 or AUC 400-550 (unless otherwise indicated) Duration: 4 wk End Date: June 24  [place] Per Protocol:  Home health RN for IV administration and teaching; PICC line care and labs.    Labs weekly while on IV antibiotics: _x_ CBC with differential _x_ BMP  _x_ CRP _x_ ESR _x_ Vancomycin trough   __x Please pull PIC at completion of IV antibiotics   Fax weekly labs to [phone]  Clinic Follow Up Appt: 2 and 4 wk  @ [facility]

## 2020-08-22 NOTE — Progress Notes (Signed)
Patient ID: Jeremy Holmes, male   DOB: 1934-01-14, 87 y.o.   MRN: [id number] Care management indicates that patient's insurer is not  Available till Tues to gain preauthorization for IV antibiotic Treatment, this is for use of any antibiotic IV and HHN. A PICCU line was ordered but since preauthorization has not occurred the PICCU line nurse is reluctant to  Place the line, as the home regimen is not approved by The insurer. PICCU line will need to be ordered on Monday so that it will be placed prior to discharge home on Tuesday. Discussed with [name] and he understands though he is upset that he is not able to leave the hospital as his treatment requires IV antibiotics and his Insurer is not available to provide preauthorization of  Antibiotics and home health nursing care.

## 2020-08-22 NOTE — Discharge Instructions (Addendum)
Weight bearing as tolerated.  Do not remove bandage until follow up appointment.  Do not get bandage wet.  Continue iv antibiotics as instructed.  Follow up appointment thursday  Septic Arthritis Septic arthritis is inflammation of a joint that results from an infection. The infection occurs when bacteria or other germs get inside a joint. The knee and hip joints are most often affected, but other joints may also become infected. Usually, just one joint is affected. Joint infections need to be treated quickly to prevent damage to the joint, and to prevent the infection from spreading to other areas of your body. What are the causes? This condition is most often caused by Staphylococcus bacteria. Other causes may include:  Fungal infections.  Sexually transmitted infections (STIs).  Tuberculosis. Bacteria or other germs can spread to the joint. These bacteria are usually from:  Blood carrying germs from an infection in another part of your body to your joint. This is the most common cause of septic arthritis.  An open wound near the joint.  A needle put into the joint.  Joint surgery.  An infection in the bone (osteomyelitis) that spreads to the joint. What increases the risk? You may have a higher risk for this condition if you:  Have an artificial joint.  Have a blood or skin infection.  Have open sores or wounds on your skin.  Had a recent joint surgery or procedure.  Had a recent joint injury.  Have a long-term (chronic) disease, such as: ? Diabetes. ? Osteoarthritis. ? Rheumatoid arthritis. ? HIV (human immunodeficiency virus).  Have a condition or take medicines that weaken your body's defense system (immune system).  Use IV medicines.  Have gonorrhea.  Have a central line for IV access. What are the signs or symptoms? Symptoms of this condition include:  Swelling at the joint.  Severe pain in the joint.  Redness and warmth in the joint.  Being unable to  move the joint.  Fever and chills. How is this diagnosed? This condition may be diagnosed based on:  Your symptoms.  Your medical history.  A physical exam.  Other tests to confirm the diagnosis. These may include: ? Removing fluid from your joint to look for signs of infection (synovial fluid analysis). ? Blood tests.  Imaging studies. These may include: ? X-rays. ? MRI. ? CT scan. ? Ultrasound. How is this treated? This condition may be treated by:  Draining fluid from your joint. This may be done for several days in order to relieve pain.  Taking antibiotic medicine. This may be given by IV or by mouth. It may be done in a hospital at first. You may have to continue antibiotics at home by IV or by mouth for several weeks after that.  Surgery to remove: ? Infected fluid and tissue from the joint. ? An infected artificial joint. After the infection has started to heal, you may need physical therapy to regain strength and motion of the joint. Follow these instructions at home: Medicines  Take over-the-counter and prescription medicines only as told by your health care provider.  If you were prescribed an antibiotic medicine, take it as told by your health care provider. Do not stop taking the antibiotic even if you start to feel better.  Follow instructions from your health care provider about how to take antibiotics at home by IV. You may need to have a nurse come to your home to give you antibiotics through IV.   Managing pain, stiffness,  and swelling  If directed, put ice on the affected area: ? Put ice in a plastic bag. ? Place a towel between your skin and the bag. ? Leave the ice on for 20 minutes, 2-3 times a day.  Raise (elevate) the affected area above the level of your heart while you are sitting or lying down.   Activity  Return to your normal activities as told by your health care provider. Ask your health care provider what activities are safe for  you.  Do any exercises or stretches as told by your health care provider or physical therapist. General instructions  Do not use any products that contain nicotine or tobacco, such as cigarettes and e-cigarettes. These can delay healing. If you need help quitting, ask your health care provider.  Wash your hands often with soap and water. If soap and water are not available, use hand sanitizer.  Keep all follow-up visits as told by your health care provider. This is important. Contact a health care provider if you:  Have pain that is not controlled with medicine.  Develop a fever or chills.  Have redness, warmth, pain, or swelling that returns after treatment. Get help right away if you:  Have signs of worsening infection in your joint. Watch for: ? Very severe pain. ? Redness. ? Warmth. ? Swelling.  Have rapid breathing or you have trouble breathing.  Have chest pain.  Cannot drink fluids or make urine.  Notice that the affected area changed color or turned blue.  Have numbness or severe pain in the affected area. Summary  Septic arthritis is inflammation of a joint that occurs when bacteria or other germs get inside a joint and cause an infection.  Joint infections need to be treated quickly to prevent damage to the joint, and to prevent the infection from spreading to other areas of your body.  Symptoms of joint infection include redness, warmth, swelling, pain, and being unable to move a joint.  Treatment usually involves draining fluid from the joint and taking antibiotic medicine. This information is not intended to replace advice given to you by your health care provider. Make sure you discuss any questions you have with your health care provider. Document Revised: 01/22/2019 Document Reviewed: 04/25/2017 Elsevier Patient Education  2021 [employer]. Home health nursing agency will assist in the maintenance of your PICCU (Pic line) and with teaching the giving of  the IV antibiotics. One dose of  Each IV medication is necessary daily for a total of 4 weeks from the time of surgery 08/20/2020. The PICCU line also should have IV anticoagulant to prevent blood clotting of the vein and the tubing of the line.  Follow up with Dr.Xu/ Cleophas Dunker and the infectious disease physician until the infection is resolved.

## 2020-08-22 NOTE — Progress Notes (Addendum)
Pharmacy Antibiotic Note  Jeremy Holmes is a 87 y.o. male admitted on 08/20/2020 with septic arthritis .  Pharmacy has been consulted for vancomycin dosing.  Concern for R knee septic arthritis in setting of prior cortisone injections and visco-gel injections s/p R knee arthroscopy with I&D and aspiration on 5/26. No growth on cx. Plan originally was for daptomycin and ceftriaxone at discharge per ID - however, pt insurance is not covering daptomycin so will change to vancomycin. WBC 11.3, afebrile, Scr 1.04 (CrCl 49 mL/min). Per chart received vancomycin 2250 mg IV around OR on 5/26 - given dosing on 5/26 will hold on any additional load but will start maintenance dose today.   Plan: Vancomycin 1000 mg IV every 24 hours (estAUC 466) Continue ceftriaxone 2g IV every 24 hours Monitor renal fx, cx results, clinical pic, and vanc levels as appropriate - consider checking VT prior to 4th maintenance dose   Height: 5\' 8"  (172.7 cm) Weight: 65.8 kg (145 lb) IBW/kg (Calculated) : 68.4  Temp (24hrs), Avg:98.8 F (37.1 C), Min:98.5 F (36.9 C), Max:99.5 F (37.5 C)  Recent Labs  Lab 08/20/20 1034  WBC 11.3*  CREATININE 1.04    Estimated Creatinine Clearance: 49.2 mL/min (by C-G formula based on SCr of 1.04 mg/dL).    No Known Allergies  Antimicrobials this admission: Vancomycin 5/26, 5/28 >>  Ceftriaxone 5/27 >>  Daptomycin 5/27 x1  Dose adjustments this admission: N/A  Microbiology results: 5/26 R Knee Synovial Fluid Cx: ngtd  5/26 COVID PCR: neg  Thank you for allowing pharmacy to be a part of this patient's care.  6/26, PharmD, BCCCP Clinical Pharmacist  Phone: [phone] 08/22/2020 3:17 PM  Please check AMION for all [facility] Pharmacy phone numbers After 10:00 PM, call Main Pharmacy [phone]

## 2020-08-22 NOTE — Progress Notes (Signed)
At bedside since 1425 for PICC placement.  Wendi RN CM/SW, Dr Otelia Sergeant, Charlcie Cradle RN, Dr Drue Second and pt in conversations re medicare approval for IV meds, HHN and PICC line.  Insurance closed for holiday so unable to approve care for home.  Pt with adequate IV access at this time for current meds.  Plan to stay hospitalized until Tuesday IV dose administered, then home for his birthday and Home health to start Wednesday once approved.  Dr Otelia Sergeant to cancel PICC order until d/c date.  Pt disappointed but agrees to plan of care.

## 2020-08-23 LAB — GLUCOSE, CAPILLARY
Glucose-Capillary: 133 mg/dL — ABNORMAL HIGH (ref 70–99)
Glucose-Capillary: 159 mg/dL — ABNORMAL HIGH (ref 70–99)
Glucose-Capillary: 172 mg/dL — ABNORMAL HIGH (ref 70–99)
Glucose-Capillary: 136 mg/dL — ABNORMAL HIGH (ref 70–99)

## 2020-08-23 NOTE — Progress Notes (Addendum)
     Subjective: 3 Days Post-Op Procedure(s) (LRB): ARTHROSCOPY KNEE AND IRRIGATION AND DEBRIDEMENT (Right) Awake, alert and oriented x 4. Frustrated by insurance situation and the inablility to get preauthor till [employer].  Tues AM PICCU line  And orders for antibiotics and discharge then.  Patient reports pain as mild.    Objective:   VITALS:  Temp:  [98.2 F (36.8 C)-98.8 F (37.1 C)] 98.2 F (36.8 C) (05/29 0811) Pulse Rate:  [73-80] 73 (05/29 0811) Resp:  [16-17] 17 (05/29 0811) BP: (147-168)/(55-65) 148/65 (05/29 0811) SpO2:  [96 %-99 %] 98 % (05/29 0811)  Neurologically intact ABD soft Neurovascular intact Sensation intact distally Intact pulses distally Dorsiflexion/Plantar flexion intact Incision: scant drainage Compartment soft Dressings changed and mepilex small bandaids applied then 6 inch ace wrappp   LABS No results for input(s): HGB, WBC, PLT in the last 72 hours. No results for input(s): NA, K, CL, CO2, BUN, CREATININE, GLUCOSE in the last 72 hours. No results for input(s): LABPT, INR in the last 72 hours.   Assessment/Plan: 3 Days Post-Op Procedure(s) (LRB): ARTHROSCOPY KNEE AND IRRIGATION AND DEBRIDEMENT (Right)  Advance diet Up with therapy Continue ABX therapy due to Post-op infection  Jeremy Holmes 08/23/2020, 12:34 PM Patient ID: Jeremy Holmes, male   DOB: [birth year], 87 y.o.   MRN: [id number]

## 2020-08-23 NOTE — Plan of Care (Signed)
  Problem: Activity: Goal: Risk for activity intolerance will decrease Outcome: Progressing   Problem: Coping: Goal: Level of anxiety will decrease Outcome: Progressing   Problem: Pain Managment: Goal: General experience of comfort will improve Outcome: Progressing   Problem: Safety: Goal: Ability to remain free from injury will improve Outcome: Progressing   Problem: Skin Integrity: Goal: Risk for impaired skin integrity will decrease Outcome: Progressing   

## 2020-08-24 LAB — CELL COUNT + DIFF, W/O CRYST-SYNVL FLD
Basophils, %: 0 %
Eosinophils-Synovial: 0 % (ref 0–2)
Lymphocytes-Synovial Fld: 5 % (ref 0–74)
Monocyte/Macrophage: 2 % (ref 0–69)
Neutrophil, Synovial: 93 % — ABNORMAL HIGH (ref 0–24)
Synoviocytes, %: 0 % (ref 0–15)
WBC, Synovial: 68210 cells/uL — ABNORMAL HIGH (ref <150)

## 2020-08-24 LAB — ANAEROBIC AND AEROBIC CULTURE
AER RESULT:: NO GROWTH
MICRO NUMBER:: 11933531
MICRO NUMBER:: 11933532
SPECIMEN QUALITY:: ADEQUATE
SPECIMEN QUALITY:: ADEQUATE

## 2020-08-24 LAB — GLUCOSE, CAPILLARY
Glucose-Capillary: 136 mg/dL — ABNORMAL HIGH (ref 70–99)
Glucose-Capillary: 158 mg/dL — ABNORMAL HIGH (ref 70–99)
Glucose-Capillary: 128 mg/dL — ABNORMAL HIGH (ref 70–99)
Glucose-Capillary: 154 mg/dL — ABNORMAL HIGH (ref 70–99)

## 2020-08-24 LAB — SYNOVIAL FLUID, CRYSTAL

## 2020-08-24 MED ORDER — SODIUM CHLORIDE 0.9 % IV SOLN
550.0000 mg | Freq: Every day | INTRAVENOUS | Status: DC
  Administered 2020-08-24 – 2020-08-25 (×2): 550 mg via INTRAVENOUS
  Filled 2020-08-24 (×2): qty 11

## 2020-08-24 NOTE — Progress Notes (Signed)
PHARMACY CONSULT NOTE FOR:  OUTPATIENT  PARENTERAL ANTIBIOTIC THERAPY (OPAT)  Indication: R knee septic arthritis Regimen: Daptomycin 550 mg IV q24h, Ceftriaxone 2 gm IV q24h End date: 09/17/2020  IV antibiotic discharge orders are pended. To discharging provider:  please sign these orders via discharge navigator,  Select New Orders & click on the button choice - Manage This Unsigned Work.     Thank you for allowing pharmacy to be a part of this patient's care.  Jettie Pagan, PharmD, BCIDP Infectious Disease Pharmacist  Phone: [phone]   Please check AMION for all [facility] Pharmacy phone numbers After 10:00 PM, call Main Pharmacy [phone]  08/24/2020, 9:50 AM

## 2020-08-24 NOTE — Plan of Care (Signed)
  Problem: Activity: Goal: Risk for activity intolerance will decrease Outcome: Progressing   Problem: Coping: Goal: Level of anxiety will decrease Outcome: Progressing   Problem: Pain Managment: Goal: General experience of comfort will improve Outcome: Progressing   Problem: Safety: Goal: Ability to remain free from injury will improve Outcome: Progressing   Problem: Skin Integrity: Goal: Risk for impaired skin integrity will decrease Outcome: Progressing   

## 2020-08-24 NOTE — Progress Notes (Signed)
Physical Therapy Treatment Patient Details Name: Jeremy Holmes MRN: [id number] DOB: [birth year] Today's Date: 08/24/2020    History of Present Illness Pt is 87 yo male admitted with R knee septic arthritis and is s/p R knee major synovectomy and  I and D on 08/20/20.  He has PMH including arthritis, hyperlipidemia, and bil shoulder sx.    PT Comments    Pt progressing well towards all goals. Trialed use of straight cane on the L side for ambulation. Pt with good sequence and was stable. Pt reported "I feel good with this" in regard to ambulating with cane. Acute PT to cont to follow to promote R knee ROM, strength, and progress indep ambulation.   Follow Up Recommendations  No PT follow up;Supervision - Intermittent     Equipment Recommendations  None recommended by PT (instructed pt to ask wife to get a cane)    Recommendations for Other Services       Precautions / Restrictions Precautions Precautions: Fall Restrictions Weight Bearing Restrictions: Yes RLE Weight Bearing: Weight bearing as tolerated Other Position/Activity Restrictions: ROM as tolerated    Mobility  Bed Mobility               General bed mobility comments: pt up in room    Transfers Overall transfer level: Needs assistance Equipment used: None Transfers: Sit to/from Stand Sit to Stand: Modified independent (Device/Increase time)         General transfer comment: Cued for hand placement  Ambulation/Gait Ambulation/Gait assistance: Supervision Gait Distance (Feet): 250 Feet Assistive device: Straight cane Gait Pattern/deviations: Step-to pattern Gait velocity: wfl Gait velocity interpretation: >2.62 ft/sec, indicative of community ambulatory General Gait Details: began with RW however pt with increased trunk flexion and UE dependence, trialed unilateral crutch on L side and then straight cane. with verbal cues pt able to sequence L cane to support R LE in fluid gait pattern, pt reports "I  feel good with this" pt with improved posture as well   Stairs Stairs: Yes Stairs assistance: Supervision Stair Management: One rail Right;Step to pattern;With cane Number of Stairs: 4 General stair comments: verbal cues for sequencing of cane   Wheelchair Mobility    Modified Rankin (Stroke Patients Only)       Balance Overall balance assessment: Modified Independent                                          Cognition Arousal/Alertness: Awake/alert Behavior During Therapy: WFL for tasks assessed/performed Overall Cognitive Status: Within Functional Limits for tasks assessed                                 General Comments: pt found walking around room with RW with bare feet, chair not locked, educated pt on importance of non-skid socks and making sure chair was locked      Exercises General Exercises - Lower Extremity Ankle Circles/Pumps: AROM;Both;10 reps;Supine Long Arc Quad: AROM;Right;10 reps;Seated Heel Slides: AROM;Right;10 reps;Seated (instructed on how to use L heel to assist R into further knee flexion)    General Comments General comments (skin integrity, edema, etc.): VSS, mild swelling      Pertinent Vitals/Pain Pain Assessment: 0-10 Pain Score: 2  Pain Location: R knee Pain Descriptors / Indicators: Discomfort Pain Intervention(s): Monitored during session    Home Living  Prior Function            PT Goals (current goals can now be found in the care plan section) Acute Rehab PT Goals Patient Stated Goal: home Progress towards PT goals: Progressing toward goals    Frequency    Min 5X/week      PT Plan Current plan remains appropriate    Co-evaluation              AM-PAC PT "6 Clicks" Mobility   Outcome Measure  Help needed turning from your back to your side while in a flat bed without using bedrails?: None Help needed moving from lying on your back to sitting on  the side of a flat bed without using bedrails?: None Help needed moving to and from a bed to a chair (including a wheelchair)?: None Help needed standing up from a chair using your arms (e.g., wheelchair or bedside chair)?: None Help needed to walk in hospital room?: A Little Help needed climbing 3-5 steps with a railing? : A Little 6 Click Score: 22    End of Session   Activity Tolerance: Patient tolerated treatment well Patient left: in chair;with call bell/phone within reach Nurse Communication: Mobility status PT Visit Diagnosis: Other (comment)     Time: 2831-5176 PT Time Calculation (min) (ACUTE ONLY): 25 min  Charges:  $Gait Training: 8-22 mins $Therapeutic Exercise: 8-22 mins                     Lewis Shock, PT, DPT Acute Rehabilitation Services Pager #: [phone] Office #: [phone]    Iona Hansen 08/24/2020, 11:32 AM

## 2020-08-24 NOTE — Plan of Care (Signed)

## 2020-08-25 ENCOUNTER — Inpatient Hospital Stay: Payer: Self-pay

## 2020-08-25 LAB — AEROBIC/ANAEROBIC CULTURE W GRAM STAIN (SURGICAL/DEEP WOUND): Culture: NO GROWTH

## 2020-08-25 LAB — GLUCOSE, CAPILLARY
Glucose-Capillary: 129 mg/dL — ABNORMAL HIGH (ref 70–99)
Glucose-Capillary: 149 mg/dL — ABNORMAL HIGH (ref 70–99)

## 2020-08-25 MED ORDER — SODIUM CHLORIDE 0.9% FLUSH: 10.0000 mL | INTRAVENOUS | Status: DC | PRN

## 2020-08-25 MED ORDER — CHLORHEXIDINE GLUCONATE CLOTH 2 % EX PADS
6.0000 | MEDICATED_PAD | Freq: Every day | CUTANEOUS | Status: DC
  Administered 2020-08-25: 6 via TOPICAL

## 2020-08-25 MED ORDER — HEPARIN SOD (PORK) LOCK FLUSH 100 UNIT/ML IV SOLN
250.0000 [IU] | Freq: Once | INTRAVENOUS | Status: AC
  Administered 2020-08-25: 250 [IU] via INTRAVENOUS
  Filled 2020-08-25: qty 2.5

## 2020-08-25 MED ORDER — CEFTRIAXONE IV (FOR PTA / DISCHARGE USE ONLY): 2.0000 g | INTRAVENOUS | 0 refills | Status: DC

## 2020-08-25 MED ORDER — DAPTOMYCIN IV (FOR PTA / DISCHARGE USE ONLY): 550.0000 mg | INTRAVENOUS | 0 refills | Status: DC

## 2020-08-25 NOTE — Progress Notes (Addendum)
Subjective: 5 Days Post-Op Procedure(s) (LRB): ARTHROSCOPY KNEE AND IRRIGATION AND DEBRIDEMENT (Right) Patient reports pain as mild.    Objective: Vital signs in last 24 hours: Temp:  [98.1 F (36.7 C)-99.1 F (37.3 C)] 98.1 F (36.7 C) (05/31 0758) Pulse Rate:  [68-80] 80 (05/31 0758) Resp:  [15-18] 17 (05/31 0758) BP: (131-152)/(64-68) 139/64 (05/31 0758) SpO2:  [96 %-97 %] 96 % (05/31 0758)  Intake/Output from previous day: 05/30 0701 - 05/31 0700 In: 1280 [P.O.:480; IV Piggyback:800] Out: -  Intake/Output this shift: Total I/O In: -  Out: 250 [Urine:250]  No results for input(s): HGB in the last 72 hours. No results for input(s): WBC, RBC, HCT, PLT in the last 72 hours. No results for input(s): NA, K, CL, CO2, BUN, CREATININE, GLUCOSE, CALCIUM in the last 72 hours. No results for input(s): LABPT, INR in the last 72 hours.  Neurologically intact Neurovascular intact Sensation intact distally Intact pulses distally Dorsiflexion/Plantar flexion intact Incision: dressing C/D/I No cellulitis present Compartment soft   Assessment/Plan: 5 Days Post-Op Procedure(s) (LRB): ARTHROSCOPY KNEE AND IRRIGATION AND DEBRIDEMENT (Right) Advance diet Up with therapy D/C IV fluids Discharge home with home health once picc line in place and op abx approved WBAT RLE Intra-op cx ntd picc line this am  Continue daptomycin and rocephin      Cristie Hem 08/25/2020, 9:47 AM

## 2020-08-25 NOTE — Progress Notes (Signed)
Discharge summary packet provided to pt/spouse with instructions. Pt/spouse verbalized understanding of instructions. All questions and concerns were fully addressed. No complaints.Pt D/C to home with home health as ordered. Spouse is responsible for pt's ride.

## 2020-08-25 NOTE — Care Management Important Message (Signed)
Important Message  Patient Details  Name: Jeremy Holmes MRN: [id number] Date of Birth: 1933-05-09   Medicare Important Message Given:  Yes     [name] P [name] 08/25/2020, 1:05 PM

## 2020-08-25 NOTE — Progress Notes (Signed)
Physical Therapy Treatment Patient Details Name: Jeremy Holmes MRN: [id number] DOB: 04-Sep-1933 Today's Date: 08/25/2020    History of Present Illness Pt is 87 yo male admitted with R knee septic arthritis and is s/p R knee major synovectomy and  I and D on 08/20/20.  He has PMH including arthritis, hyperlipidemia, and bil shoulder sx.    PT Comments    Pt progressing well towards all goals. Pt with improved ambulation tolerance and fluidity with straight cane. Pt active R knee flexion to 90 degrees, AA to 100 deg in sitting. Acute PT to cont to follow.  Follow Up Recommendations  No PT follow up;Supervision - Intermittent (pt asked about PT s/p d/c, instructed pt to talk to MD at follow up for possible outpt script)     Equipment Recommendations  None recommended by PT    Recommendations for Other Services       Precautions / Restrictions Precautions Precautions: Fall Restrictions Weight Bearing Restrictions: Yes RLE Weight Bearing: Weight bearing as tolerated    Mobility  Bed Mobility               General bed mobility comments: pt up in chair upon PT arrival    Transfers Overall transfer level: Needs assistance Equipment used: None Transfers: Sit to/from Stand Sit to Stand: Modified independent (Device/Increase time)         General transfer comment: instruction on cane management for sit stand transfers  Ambulation/Gait Ambulation/Gait assistance: Supervision Gait Distance (Feet): 500 Feet Assistive device: Straight cane Gait Pattern/deviations: Step-through pattern Gait velocity: wfl Gait velocity interpretation: >2.62 ft/sec, indicative of community ambulatory General Gait Details: started with straight cane, pt with good sequencing with minimal antalgia, pt ambulated approx 75 without AD, pt wiht decreased step length and increased antalgia and 1 episode of LOB, pt reports "I feel better with the cane' PT agrees   Metallurgist Rankin (Stroke Patients Only)       Balance Overall balance assessment: Modified Independent                                          Cognition Arousal/Alertness: Awake/alert Behavior During Therapy: WFL for tasks assessed/performed Overall Cognitive Status: Within Functional Limits for tasks assessed                                 General Comments: pt reports "I"m not breaking the rules today. I have my socks on"      Exercises General Exercises - Lower Extremity Quad Sets: AROM;Right;10 reps (with heel on rolled towel) Short Arc Quad: AROM;Right;10 reps;Seated (with LE extended and knee on rolled towel) Long Arc Quad: AROM;Right;10 reps;Seated Heel Slides: AROM;Right;10 reps;Seated    General Comments General comments (skin integrity, edema, etc.): VSS, mild R Knee swelling      Pertinent Vitals/Pain Pain Assessment: 0-10 Pain Score: 2  Pain Location: R knee Pain Descriptors / Indicators: Tightness Pain Intervention(s): Monitored during session    Home Living                      Prior Function            PT Goals (current goals can now be found  in the care plan section) Acute Rehab PT Goals Patient Stated Goal: home Progress towards PT goals: Progressing toward goals    Frequency    Min 5X/week      PT Plan Current plan remains appropriate    Co-evaluation              AM-PAC PT "6 Clicks" Mobility   Outcome Measure  Help needed turning from your back to your side while in a flat bed without using bedrails?: None Help needed moving from lying on your back to sitting on the side of a flat bed without using bedrails?: None Help needed moving to and from a bed to a chair (including a wheelchair)?: None Help needed standing up from a chair using your arms (e.g., wheelchair or bedside chair)?: None Help needed to walk in hospital room?: A Little Help needed climbing  3-5 steps with a railing? : A Little 6 Click Score: 22    End of Session   Activity Tolerance: Patient tolerated treatment well Patient left: in chair;with call bell/phone within reach Nurse Communication: Mobility status PT Visit Diagnosis: Other (comment)     Time: 6659-9357 PT Time Calculation (min) (ACUTE ONLY): 21 min  Charges:  $Gait Training: 8-22 mins                     Lewis Shock, PT, DPT Acute Rehabilitation Services Pager #: [phone] Office #: [phone]    Iona Hansen 08/25/2020, 8:55 AM

## 2020-08-25 NOTE — Discharge Summary (Signed)
Patient ID: AWAB ABEBE MRN: [id number] DOB/AGE: [birth year] 85 y.o.  Admit date: 08/20/2020 Discharge date: 08/25/2020  Admission Diagnoses:  Principal Problem:   Septic arthritis of knee, right [facility])   Discharge Diagnoses:  Same  Past Medical History:  Diagnosis Date  . Anxiety    no meds  . Arthritis    right knee  . Depression    no meds  . HLD (hyperlipidemia)   . Pre-diabetes    diet controlled - no meds    Surgeries: Procedure(s): ARTHROSCOPY KNEE AND IRRIGATION AND DEBRIDEMENT on 08/20/2020   Consultants:   Discharged Condition: Improved  Hospital Course: Jeremy Holmes is an 87 y.o. male who was admitted 08/20/2020 for operative treatment ofSeptic arthritis of knee, right ([place]). Patient has severe unremitting pain that affects sleep, daily activities, and work/hobbies. After pre-op clearance the patient was taken to the operating room on 08/20/2020 and underwent  Procedure(s): ARTHROSCOPY KNEE AND IRRIGATION AND DEBRIDEMENT.    Patient was given perioperative antibiotics:  Anti-infectives (From admission, onward)   Start     Dose/Rate Route Frequency Ordered Stop   08/25/20 0000  cefTRIAXone (ROCEPHIN) IVPB        2 g Intravenous Every 24 hours 08/25/20 0949 09/18/20 2359   08/25/20 0000  daptomycin (CUBICIN) IVPB        550 mg Intravenous Every 24 hours 08/25/20 0949 09/18/20 2359   08/24/20 2000  DAPTOmycin (CUBICIN) 550 mg in sodium chloride 0.9 % IVPB        550 mg 122 mL/hr over 30 Minutes Intravenous Daily 08/24/20 0953     08/22/20 1630  vancomycin (VANCOREADY) IVPB 1000 mg/200 mL  Status:  Discontinued        1,000 mg 200 mL/hr over 60 Minutes Intravenous Every 24 hours 08/22/20 1518 08/24/20 0953   08/22/20 0000  cefTRIAXone 2 g in sodium chloride 0.9 % 100 mL  Status:  Discontinued        2 g Intravenous Every 24 hours 08/22/20 1239 08/22/20    08/22/20 0000  cefTRIAXone (ROCEPHIN) IVPB        2 g Intravenous Every 24 hours 08/22/20 1239 09/19/20  2359   08/22/20 0000  daptomycin (CUBICIN) IVPB  Status:  Discontinued        550 mg Intravenous Every 24 hours 08/22/20 1239 08/23/20    08/21/20 1700  vancomycin (VANCOREADY) IVPB 1000 mg/200 mL  Status:  Discontinued        1,000 mg 200 mL/hr over 60 Minutes Intravenous Every 24 hours 08/20/20 1608 08/21/20 1228   08/21/20 1700  DAPTOmycin (CUBICIN) 550 mg in sodium chloride 0.9 % IVPB  Status:  Discontinued        8 mg/kg  65.8 kg 122 mL/hr over 30 Minutes Intravenous Daily 08/21/20 1228 08/22/20 1509   08/21/20 1330  cefTRIAXone (ROCEPHIN) 2 g in sodium chloride 0.9 % 100 mL IVPB        2 g 200 mL/hr over 30 Minutes Intravenous Every 24 hours 08/21/20 1228     08/20/20 1700  vancomycin (VANCOREADY) IVPB 1250 mg/250 mL        1,250 mg 166.7 mL/hr over 90 Minutes Intravenous NOW 08/20/20 1608 08/20/20 1946       Patient was given sequential compression devices, early ambulation, and chemoprophylaxis to prevent DVT.  Patient benefited maximally from hospital stay and there were no complications.    Recent vital signs:  Patient Vitals for the past 24 hrs:  BP Temp  Temp src Pulse Resp SpO2  08/25/20 0758 139/64 98.1 F (36.7 C) Oral 80 17 96 %  08/25/20 0522 (!) 152/66 99.1 F (37.3 C) Oral 68 15 97 %  08/24/20 2107 (!) 149/65 99.1 F (37.3 C) Oral 72 16 97 %  08/24/20 1500 131/68 98.3 F (36.8 C) Oral 75 18 96 %     Recent laboratory studies: No results for input(s): WBC, HGB, HCT, PLT, NA, K, CL, CO2, BUN, CREATININE, GLUCOSE, INR, CALCIUM in the last 72 hours.  Invalid input(s): PT, 2   Discharge Medications:   Allergies as of 08/25/2020   No Known Allergies     Medication List    TAKE these medications   acetaminophen 500 MG tablet Commonly known as: TYLENOL Take 1,000 mg by mouth every 6 (six) hours as needed for moderate pain.   aspirin 81 MG chewable tablet Chew 81 mg by mouth daily.   atorvastatin 20 MG tablet Commonly known as: LIPITOR Take 20 mg by  mouth daily.   cefTRIAXone  IVPB Commonly known as: ROCEPHIN Inject 2 g into the vein daily for 28 days. Indication:  Right knee septic arthritis First Dose: No Last Day of Therapy:  09/17/2020  Labs - Once weekly:  CBC/D and BMP, Labs - Every other week:  ESR and CRP Method of administration: IV Push Method of administration may be changed at the discretion of home infusion pharmacist based upon assessment of the patient and/or caregiver's ability to self-administer the medication ordered.   cefTRIAXone  IVPB Commonly known as: ROCEPHIN Inject 2 g into the vein daily for 24 days. Indication:  Septic joint infection First Dose: No Last Day of Therapy:  09/17/2020 Labs - Once weekly:  CBC/D and BMP, Labs - Every other week:  ESR and CRP Method of administration: IV Push Method of administration may be changed at the discretion of home infusion pharmacist based upon assessment of the patient and/or caregiver's ability to self-administer the medication ordered.   CENTRUM SILVER PO Take 1 tablet by mouth daily.   CINNAMON PO Take 1,000 mg by mouth daily.   daptomycin  IVPB Commonly known as: CUBICIN Inject 550 mg into the vein daily for 24 days. Indication:  Septic joint infection First Dose: No Last Day of Therapy:  09/17/2020 Labs - Once weekly:  CBC/D, BMP, and CPK Labs - Every other week:  ESR and CRP Method of administration: IV Push Method of administration may be changed at the discretion of home infusion pharmacist based upon assessment of the patient and/or caregiver's ability to self-administer the medication ordered.   Fish Oil 1200 MG Caps Take 1 capsule by mouth daily.   HYDROcodone-acetaminophen 5-325 MG tablet Commonly known as: NORCO/VICODIN Take 1-2 tablets by mouth every 4 (four) hours as needed for moderate pain (pain score 4-6).   methocarbamol 500 MG tablet Commonly known as: ROBAXIN Take 1 tablet (500 mg total) by mouth every 8 (eight) hours as needed  for muscle spasms.   vitamin C with rose hips 1000 MG tablet Take 1,000 mg by mouth daily.            Durable Medical Equipment  (From admission, onward)         Start     Ordered   08/21/20 1619  For home use only DME Walker rolling  Once       Question Answer Comment  Walker: With 5 Inch Wheels   Patient needs a walker to treat with the following  condition Weakness      08/21/20 1618           Discharge Care Instructions  (From admission, onward)         Start     Ordered   08/25/20 0000  Change dressing on IV access line weekly and PRN  (Home infusion instructions - [facility] )        08/25/20 0949   08/22/20 0000  Change dressing on IV access line weekly and PRN  (Home infusion instructions - [facility] )        08/22/20 1239          Diagnostic Studies: [place] EKG SITE RITE  Result Date: 08/22/2020 If Site Rite image not attached, placement could not be confirmed due to current cardiac rhythm.   Disposition: Discharge disposition: 01-Home or Self Care       Discharge Instructions    [facility] pharmacist to adjust dose for Vancomycin, Aminoglycosides and other anti-infective therapies as requested by physician.   Complete by: As directed    [facility] pharmacist to adjust dose for Vancomycin, Aminoglycosides and other anti-infective therapies as requested by physician.   Complete by: As directed    [facility] to provide Cath Flo 46m   Complete by: As directed    Administer for PICC line occlusion and as ordered by physician for other access device issues.   [facility] to provide Cath Flo 22m  Complete by: As directed    Administer for PICC line occlusion and as ordered by physician for other access device issues.   Anaphylaxis Kit: Provided to treat any anaphylactic reaction to the medication being provided to the patient if First Dose or when requested by physician   Complete  by: As directed    Epinephrine 34m37ml vial / amp: Administer 0.86mg47m.86ml)386mbcutaneously once for moderate to severe anaphylaxis, nurse to call physician and pharmacy when reaction occurs and call 911 if needed for immediate care   Diphenhydramine 50mg/47mV vial: Administer 25-50mg I1086m PRN for first dose reaction, rash, itching, mild reaction, nurse to call physician and pharmacy when reaction occurs   Sodium Chloride 0.9% NS 500ml IV19mminister if needed for hypovolemic blood pressure drop or as ordered by physician after call to physician with anaphylactic reaction   Anaphylaxis Kit: Provided to treat any anaphylactic reaction to the medication being provided to the patient if First Dose or when requested by physician   Complete by: As directed    Epinephrine 34mg/ml v46m / amp: Administer 0.86mg (0.86m486msubcu66meously once for moderate to severe anaphylaxis, nurse to call physician and pharmacy when reaction occurs and call 911 if needed for immediate care   Diphenhydramine 50mg/ml IV 234m: Administer 25-50mg IV/IM P57mor first dose reaction, rash, itching, mild reaction, nurse to call physician and pharmacy when reaction occurs   Sodium Chloride 0.9% NS 500ml IV: Admi25mer if needed for hypovolemic blood pressure drop or as ordered by physician after call to physician with anaphylactic reaction   Change dressing on IV access line weekly and PRN   Complete by: As directed    Change dressing on IV access line weekly and PRN   Complete by: As directed    Flush IV access with Sodium Chloride 0.9% and Heparin 10 units/ml or 100 units/ml   Complete by: As directed    Flush IV access with Sodium Chloride 0.9% and Heparin 10 units/ml or 100 units/ml  Complete by: As directed    Home infusion instructions - [facility]   Complete by: As directed    Instructions: Flush IV access with Sodium Chloride 0.9% and Heparin 10units/ml or 100units/ml   Change dressing on IV access line: Weekly  and PRN   Instructions Cath Flo 28m: Administer for PICC Line occlusion and as ordered by physician for other access device   [facility] pharmacist to adjust dose for: Vancomycin, Aminoglycosides and other anti-infective therapies as requested by physician   Home infusion instructions - [facility]   Complete by: As directed    Instructions: Flush IV access with Sodium Chloride 0.9% and Heparin 10units/ml or 100units/ml   Change dressing on IV access line: Weekly and PRN   Instructions Cath Flo 270m Administer for PICC Line occlusion and as ordered by physician for other access device   [facility] pharmacist to adjust dose for: Vancomycin, Aminoglycosides and other anti-infective therapies as requested by physician   Method of administration may be changed at the discretion of home infusion pharmacist based upon assessment of the patient and/or caregiver's ability to self-administer the medication ordered   Complete by: As directed    Method of administration may be changed at the discretion of home infusion pharmacist based upon assessment of the patient and/or caregiver's ability to self-administer the medication ordered   Complete by: As directed    Outpatient Parenteral Antibiotic Therapy Information Antibiotic: Daptomycin (Cubicin) IVPB; Indications for use: Right knee septic arthritis; End Date: 09/17/2020   Complete by: As directed    Antibiotic: Daptomycin (Cubicin) IVPB   Indications for use: Right knee septic arthritis   End Date: 09/17/2020       Follow-up Information    XuLeandrew KoyanagiMD In 1 week.   Specialty: Orthopedic Surgery Why: For suture removal, For wound re-check Contact information: [phone]        Health, [facility] up.   [facility] information: [phone]                 Signed: MaAundra Dubin/31/2022, 9:52 AM

## 2020-08-25 NOTE — Progress Notes (Signed)
Peripherally Inserted Central Catheter Placement  The IV Nurse has discussed with the patient and/or persons authorized to consent for the patient, the purpose of this procedure and the potential benefits and risks involved with this procedure.  The benefits include less needle sticks, lab draws from the catheter, and the patient may be discharged home with the catheter. Risks include, but not limited to, infection, bleeding, blood clot (thrombus formation), and puncture of an artery; nerve damage and irregular heartbeat and possibility to perform a PICC exchange if needed/ordered by physician.  Alternatives to this procedure were also discussed.  Bard Power PICC patient education guide, fact sheet on infection prevention and patient information card has been provided to patient /or left at bedside.    PICC Placement Documentation  PICC Single Lumen 08/25/20 Right Basilic 38 cm 0 cm (Active)  Indication for Insertion or Continuance of Line Home intravenous therapies (PICC only) 08/25/20 1315  Exposed Catheter (cm) 0 cm 08/25/20 1315  Site Assessment Clean;Dry;Intact 08/25/20 1315  Line Status Flushed;Blood return noted;Saline locked 08/25/20 1315  Dressing Type Transparent 08/25/20 1315  Dressing Status Clean;Dry;Intact 08/25/20 1315  Antimicrobial disc in place? Yes 08/25/20 1315  Dressing Change Due 09/01/20 08/25/20 1315       Audrie Gallus 08/25/2020, 1:16 PM

## 2020-08-25 NOTE — TOC Transition Note (Signed)
Transition of Care [facility]) - CM/SW Discharge Note   Patient Details  Name: Jeremy Holmes MRN: [id number] Date of Birth: 09-Apr-1933  [facility] CM/SW Contact:  Epifanio Lesches, RN Phone Number: 08/25/2020, 4:07 PM   Clinical Narrative:    Patient will DC to: home Anticipated DC date: 08/25/2020 Family notified: yes, wife Transport by: car  Late entry:       Admitted with Septic arthritis of knee, right .  Per MD patient ready for DC today . RN, patient, patient's wife, and [facility] notified of DC. IV home infusion teaching completed by [facility]. Start of care with [facility] will begin 6/2.   Pt without Rx med concerns.  Post hospital f/u appointment noted on AVS.  RNCM will sign off for now as intervention is no longer needed. Please consult [place] again if new needs arise.    Final next level of care: Home w Home Health Services Barriers to Discharge: No Barriers Identified   Patient Goals and CMS Choice Patient states their goals for this hospitalization and ongoing recovery are:: to return to home CMS Medicare.gov Compare Post Acute Care list provided to:: Patient Choice offered to / list presented to : Patient  Discharge Placement                       Discharge Plan and Services   Discharge Planning Services: CM Consult Post Acute Care Choice: Home Health          DME Arranged: Walker rolling DME Agency: [employer] Date DME Agency Contacted: 08/21/20 Time DME Agency Contacted: [phone] Representative spoke with at DME Agency: Velna Hatchet HH Arranged: RN HH Agency: [facility] Date HH Agency Contacted: 08/21/20 Time HH Agency Contacted: 1615 Representative spoke with at [facility] Agency: Stacie  Social Determinants of Health (SDOH) Interventions     Readmission Risk Interventions No flowsheet data found.

## 2020-08-27 ENCOUNTER — Encounter: Payer: Self-pay | Admitting: Orthopaedic Surgery

## 2020-08-27 ENCOUNTER — Ambulatory Visit (INDEPENDENT_AMBULATORY_CARE_PROVIDER_SITE_OTHER): Payer: Medicare Other | Admitting: Orthopaedic Surgery

## 2020-08-27 DIAGNOSIS — M2142 Flat foot [pes planus] (acquired), left foot: Secondary | ICD-10-CM | POA: Diagnosis not present

## 2020-08-27 DIAGNOSIS — Z4789 Encounter for other orthopedic aftercare: Secondary | ICD-10-CM | POA: Diagnosis not present

## 2020-08-27 DIAGNOSIS — M009 Pyogenic arthritis, unspecified: Secondary | ICD-10-CM

## 2020-08-27 DIAGNOSIS — M1711 Unilateral primary osteoarthritis, right knee: Secondary | ICD-10-CM | POA: Diagnosis not present

## 2020-08-27 MED ORDER — BUPIVACAINE HCL 0.25 % IJ SOLN
2.0000 mL | INTRAMUSCULAR | Status: AC | PRN
  Administered 2020-08-27: 2 mL via INTRA_ARTICULAR

## 2020-08-27 MED ORDER — LIDOCAINE HCL 1 % IJ SOLN
2.0000 mL | INTRAMUSCULAR | Status: AC | PRN
Start: 2020-08-27 — End: 2020-08-27
  Administered 2020-08-27: 2 mL

## 2020-08-27 NOTE — Progress Notes (Signed)
Post-Op Visit Note   Patient: Jeremy Holmes           Date of Birth: 1933/05/25           MRN: [id number] Visit Date: 08/27/2020 PCP: [name], MD   Assessment & Plan:  Chief Complaint:  Chief Complaint  Patient presents with  . Right Knee - Routine Post Op, Follow-up   Visit Diagnoses:  1. Pyogenic arthritis of right knee joint, due to unspecified organism [facility])     Plan: [name] is a pleasant 87 year old gentleman who comes in today 1 week out right knee arthroscopic irrigation debridement following right knee joint infection likely from viscosupplementation injection.  He has been doing well.  He is getting daptomycin and Rocephin via PICC line.  He is scheduled to see infectious disease on 09/02/2020.  Intraoperative cultures are negative to date.  He denies any fevers or chills.  Examination of the right knee reveals a large effusion.  He does have 2 fully healed surgical portals without complication.  Calf is soft and nontender.  He is neurovascularly intact distally.  At this point, he will continue with his PICC line and IV antibiotics per infectious disease.  We will remove his sutures today and apply Steri-Strips.  Today, I aspirated approximately 15 cc of blood from the right knee.  Compression wrap was applied.  He will apply heat as needed.     Procedure Note  Patient: Jeremy Holmes             Date of Birth: 11-15-1933           MRN: [id number]             Visit Date: 08/27/2020  Procedures: Visit Diagnoses:  1. Pyogenic arthritis of right knee joint, due to unspecified organism [facility])     Large Joint Inj: R knee on 08/27/2020 11:07 AM Indications: pain Details: 22 G needle, anterolateral approach Medications: 2 mL lidocaine 1 %; 2 mL bupivacaine 0.25 %     I have provided him with a knee range of motion handout.  He will follow-up with [place] in 2 weeks time for recheck.  Call with concerns or questions.  Follow-Up Instructions: Return in about 2 weeks (around  09/10/2020).   Orders:  Orders Placed This Encounter  Procedures  . Large Joint Inj   No orders of the defined types were placed in this encounter.   Imaging: No new imaging  PMFS History: Patient Active Problem List   Diagnosis Date Noted  . Septic arthritis of knee, right ([facility]) 08/19/2020  . Pes planus of left foot 03/12/2020  . Unilateral primary osteoarthritis, right knee 10/15/2019  . Baker cyst, right 10/15/2019  . Generalized anxiety disorder 06/07/2014  . Adjustment disorder with depressed mood 06/07/2014   Past Medical History:  Diagnosis Date  . Anxiety    no meds  . Arthritis    right knee  . Depression    no meds  . HLD (hyperlipidemia)   . Pre-diabetes    diet controlled - no meds    History reviewed. No pertinent family history.  Past Surgical History:  Procedure Laterality Date  . COLONOSCOPY    . ELBOW SURGERY Right   . EYE SURGERY Bilateral    cataracts surgery  . KNEE ARTHROSCOPY Right 08/20/2020   Procedure: ARTHROSCOPY KNEE AND IRRIGATION AND DEBRIDEMENT;  Surgeon: Tarry Kos, MD;  Location: [facility] OR;  Service: Orthopedics;  Laterality: Right;  . SHOULDER SURGERY  Bilateral    x 2  . WISDOM TOOTH EXTRACTION     Social History   Occupational History  . Not on file  Tobacco Use  . Smoking status: Former Smoker    Types: Cigarettes  . Smokeless tobacco: Never Used  Vaping Use  . Vaping Use: Never used  Substance and Sexual Activity  . Alcohol use: Yes    Alcohol/week: 7.0 standard drinks    Types: 7 Glasses of wine per week    Comment: at night with supper  . Drug use: No  . Sexual activity: Not on file

## 2020-08-31 ENCOUNTER — Encounter: Payer: Self-pay | Admitting: Infectious Disease

## 2020-08-31 DIAGNOSIS — M009 Pyogenic arthritis, unspecified: Secondary | ICD-10-CM | POA: Diagnosis not present

## 2020-09-02 ENCOUNTER — Ambulatory Visit: Payer: Medicare Other | Admitting: Infectious Diseases

## 2020-09-02 ENCOUNTER — Encounter: Payer: Self-pay | Admitting: Infectious Diseases

## 2020-09-02 ENCOUNTER — Other Ambulatory Visit: Payer: Self-pay

## 2020-09-02 VITALS — BP 174/74 | HR 84 | Temp 98.0°F | Wt 149.0 lb

## 2020-09-02 DIAGNOSIS — Z5181 Encounter for therapeutic drug level monitoring: Secondary | ICD-10-CM | POA: Diagnosis not present

## 2020-09-02 DIAGNOSIS — M009 Pyogenic arthritis, unspecified: Secondary | ICD-10-CM

## 2020-09-02 NOTE — Progress Notes (Signed)
[facility]                                                             [address], [place], [place], [address]                                                                  Phn. [phone]; Fax: [phone]                                                                             Date: 09/02/20  Reason for Referral: HFU Septic arthritis of rt foot  Assessment Right knee septic arthritis, native joint In the setting of prior cortisone injections and visco-gel injections Denies being on PO antibiotics prior to admit  S/p right knee arthroscopy with major synovectomy, arthroscopic I&D and aspiration of right knee join on 5/26. OR cultures no growth so far with no growth in gram stain.  Plan Continue daptomycin and ceftriaxone as is. End date 09/17/20 Have requested staff to get labs from [facility] in 2 weeks FU with Ortho   All questions and concerns were discussed and addressed. Patient verbalized understanding of the plan. ____________________________________________________________________________________________________________________  HPI: 87-year-old male with past medical history of right knee arthritis, prediabetes, hyperlipidemia, anxiety and depression here for HFU for rt knee native joint septic arthritis. Patient was recently hospitalized 5/26-5/31/22 for rt knee septic arthritis in the setting of cortisone and viscogel injections. He was seen in the office on 5/25 where it was noted that he had a tense right knee effusion with warmth, fullness in the popliteal fossa with tenderness.  Approximately 100 cc of purulent fluid was aspirated from his right knee.  Synovial fluid analysis with 68, 210 nucleated cells with 93% neutrophils.  Patient directly admitted for I&D of right knee.  Patient underwent right knee arthroscopy with major synovectomy, arthroscopic I&D and aspiration of  right knee joint.  OR cultures no growth so far. He was discharged from hospital on 5/31 to complete 4 weeks of IV daptomycin and ceftriaxone.   He was seen by Ortho on 6/2 and was thought to be doing well, sutures removed. 15 CC of blood was removed from rt knee. He has been getting IV antibiotics without any issues. Denies fevers, chills, sweats. Denies nausea, vomiting and diarrhea. Denies any issues with PICC line. Has mild to moderate knee pain while moving around but none while sitting still. He is able to flex, extend his knee and walk without difficulty. Discussed with plan to continue antibiotics for 2 more weeks.    ROS: Constitutional: Negative for fever, chills, activity change, appetite change, fatigue and unexpected weight change.  HENT: Negative for congestion, sore throat, rhinorrhea, sneezing, trouble swallowing and sinus pressure.  Eyes: Negative   for photophobia and visual disturbance.  Respiratory: Negative for cough, chest tightness, shortness of breath, wheezing and stridor.  Cardiovascular: Negative for chest pain, palpitations and leg swelling.  Gastrointestinal: Negative for nausea, vomiting, abdominal pain, diarrhea, constipation, blood in stool, abdominal distention and anal bleeding.  Genitourinary: Negative for dysuria, hematuria, flank pain and difficulty urinating.  Musculoskeletal: Negative for myalgias, back pain and gait problem.  Skin: Negative for color change, pallor, rash and wound.  Neurological: Negative for dizziness, tremors, weakness and light-headedness.  Hematological: Negative for adenopathy. Does not bruise/bleed easily.  Psychiatric/Behavioral: Negative for behavioral problems, confusion, sleep disturbance, dysphoric mood, decreased concentration and agitation.   Past Medical History:  Diagnosis Date  . Anxiety    no meds  . Arthritis    right knee  . Depression    no meds  . HLD (hyperlipidemia)   . Pre-diabetes    diet controlled - no meds    Past Surgical History:  Procedure Laterality Date  . COLONOSCOPY    . ELBOW SURGERY Right   . EYE SURGERY Bilateral    cataracts surgery  . KNEE ARTHROSCOPY Right 08/20/2020   Procedure: ARTHROSCOPY KNEE AND IRRIGATION AND DEBRIDEMENT;  Surgeon: Leandrew Koyanagi, MD;  Location: [facility];  Service: Orthopedics;  Laterality: Right;  . SHOULDER SURGERY Bilateral    x 2  . WISDOM TOOTH EXTRACTION     Current Outpatient Medications on File Prior to Visit  Medication Sig Dispense Refill  . acetaminophen (TYLENOL) 500 MG tablet Take 1,000 mg by mouth every 6 (six) hours as needed for moderate pain.    . Ascorbic Acid (VITAMIN C WITH ROSE HIPS) 1000 MG tablet Take 1,000 mg by mouth daily.    [name] Kitchen aspirin 81 MG chewable tablet Chew 81 mg by mouth daily.    [name] Kitchen atorvastatin (LIPITOR) 20 MG tablet Take 20 mg by mouth daily.    . cefTRIAXone (ROCEPHIN) IVPB Inject 2 g into the vein daily for 28 days. Indication:  Right knee septic arthritis First Dose: No Last Day of Therapy:  09/17/2020  Labs - Once weekly:  CBC/D and BMP, Labs - Every other week:  ESR and CRP Method of administration: IV Push Method of administration may be changed at the discretion of home infusion pharmacist based upon assessment of the patient and/or caregiver's ability to self-administer the medication ordered. 14 Units 1  . cefTRIAXone (ROCEPHIN) IVPB Inject 2 g into the vein daily for 24 days. Indication:  Septic joint infection First Dose: No Last Day of Therapy:  09/17/2020 Labs - Once weekly:  CBC/D and BMP, Labs - Every other week:  ESR and CRP Method of administration: IV Push Method of administration may be changed at the discretion of home infusion pharmacist based upon assessment of the patient and/or caregiver's ability to self-administer the medication ordered. 24 Units 0  . CINNAMON PO Take 1,000 mg by mouth daily.    . daptomycin (CUBICIN) IVPB Inject 550 mg into the vein daily for 24 days. Indication:  Septic joint  infection First Dose: No Last Day of Therapy:  09/17/2020 Labs - Once weekly:  CBC/D, BMP, and CPK Labs - Every other week:  ESR and CRP Method of administration: IV Push Method of administration may be changed at the discretion of home infusion pharmacist based upon assessment of the patient and/or caregiver's ability to self-administer the medication ordered. 24 Units 0  . HYDROcodone-acetaminophen (NORCO/VICODIN) 5-325 MG tablet Take 1-2 tablets by mouth every 4 (four) hours  as needed for moderate pain (pain score 4-6). 40 tablet 0  . methocarbamol (ROBAXIN) 500 MG tablet Take 1 tablet (500 mg total) by mouth every 8 (eight) hours as needed for muscle spasms. 30 tablet 1  . Multiple Vitamins-Minerals (CENTRUM SILVER PO) Take 1 tablet by mouth daily.    . Omega-3 Fatty Acids (FISH OIL) 1200 MG CAPS Take 1 capsule by mouth daily.     No current facility-administered medications on file prior to visit.   No Known Allergies  Social History   Socioeconomic History  . Marital status: Married    Spouse name: Not on file  . Number of children: Not on file  . Years of education: Not on file  . Highest education level: Not on file  Occupational History  . Not on file  Tobacco Use  . Smoking status: Former Smoker    Types: Cigarettes  . Smokeless tobacco: Never Used  Vaping Use  . Vaping Use: Never used  Substance and Sexual Activity  . Alcohol use: Yes    Alcohol/week: 7.0 standard drinks    Types: 7 Glasses of wine per week    Comment: at night with supper  . Drug use: No  . Sexual activity: Not on file  Other Topics Concern  . Not on file  Social History Narrative  . Not on file   Social Determinants of Health   Financial Resource Strain: Not on file  Food Insecurity: Not on file  Transportation Needs: Not on file  Physical Activity: Not on file  Stress: Not on file  Social Connections: Not on file  Intimate Partner Violence: Not on file    Vitals BP (!) 174/74    Pulse 84   Temp 98 F (36.7 C)   Wt 149 lb (67.6 kg)   SpO2 98%   BMI 22.66 kg/m   Examination  General - not in acute distress, comfortably sitting in chair HEENT - PEERLA, no pallor and no icterus Chest - b/l clear air entry, no additional sounds CVS- Normal s1s2, RRR Abdomen - Soft, Non tender , non distended Ext- no pedal edema Rt knee swollen compared to left, has full ROM, no signs of septic joint  Neuro: grossly normal Back - WNL Psych : calm and cooperative   Recent labs CBC Latest Ref Rng & Units 08/20/2020  WBC 4.0 - 10.5 K/uL 11.3(H)  Hemoglobin 13.0 - 17.0 g/dL 13.6  Hematocrit 39.0 - 52.0 % 40.4  Platelets 150 - 400 K/uL 248   CMP Latest Ref Rng & Units 08/20/2020  Glucose 70 - 99 mg/dL 135(H)  BUN 8 - 23 mg/dL 16  Creatinine 0.61 - 1.24 mg/dL 1.04  Sodium 135 - 145 mmol/L 138  Potassium 3.5 - 5.1 mmol/L 3.6  [place] 98 - 111 mmol/L 104  CO2 22 - 32 mmol/L 26  Calcium 8.9 - 10.3 mg/dL 9.0     Pertinent Microbiology Results for orders placed or performed during the hospital encounter of 08/20/20  SARS Coronavirus 2 by RT PCR (hospital order, performed in [facility] hospital lab) Nasopharyngeal Nasopharyngeal Swab     Status: None   Collection Time: 08/20/20 10:12 AM   Specimen: Nasopharyngeal Swab  Result Value Ref Range Status   SARS Coronavirus 2 NEGATIVE NEGATIVE Final    Comment: (NOTE) SARS-CoV-2 target nucleic acids are NOT DETECTED.  The SARS-CoV-2 RNA is generally detectable in upper and lower respiratory specimens during the acute phase of infection. The lowest concentration of SARS-CoV-2 viral  copies this assay can detect is 250 copies / mL. A negative result does not preclude SARS-CoV-2 infection and should not be used as the sole basis for treatment or other patient management decisions.  A negative result may occur with improper specimen collection / handling, submission of specimen other than nasopharyngeal swab, presence of viral  mutation(s) within the areas targeted by this assay, and inadequate number of viral copies (<250 copies / mL). A negative result must be combined with clinical observations, patient history, and epidemiological information.  Fact Sheet for Patients:   StrictlyIdeas.no  Fact Sheet for Healthcare Providers: BankingDealers.co.za  This test is not yet approved or  cleared by the [place] FDA and has been authorized for detection and/or diagnosis of SARS-CoV-2 by FDA under an Emergency Use Authorization (EUA).  This EUA will remain in effect (meaning this test can be used) for the duration of the COVID-19 declaration under Section 564(b)(1) of the Act, 21 U.S.C. section 360bbb-3(b)(1), unless the authorization is terminated or revoked sooner.  Performed at [facility], [place] [address], [place], [place] [address]   Aerobic/Anaerobic Culture w Gram Stain (surgical/deep wound)     Status: None   Collection Time: 08/20/20  2:50 PM   Specimen: Fluid  Result Value Ref Range Status   Specimen Description FLUID  Final   Special Requests RIGHT KNEE SYNOVIAL FLUID  Final   Gram Stain   Final    MODERATE WBC PRESENT, PREDOMINANTLY PMN NO ORGANISMS SEEN    Culture   Final    No growth aerobically or anaerobically. Performed at [facility], [place] [address] [place], [place] [address]    Report Status 08/25/2020 FINAL  Final    Pertinent Imaging All pertinent labs/Imagings/notes reviewed. All pertinent plain films and CT images have been personally visualized and interpreted; radiology reports have been reviewed. Decision making incorporated into the Impression / Recommendations.  I have spent 60 minutes for this patient encounter including  review of prior medical records with greater than 50% of time in face to face counsel of the patient/discussing diagnostics and plan of care.   Electronically signed by:  Rosiland Oz,  MD Infectious Disease Physician [facility] [address] [place], [place] [address] Phone: [phone]  Fax: [phone]

## 2020-09-07 DIAGNOSIS — E7849 Other hyperlipidemia: Secondary | ICD-10-CM | POA: Diagnosis not present

## 2020-09-07 DIAGNOSIS — M009 Pyogenic arthritis, unspecified: Secondary | ICD-10-CM | POA: Diagnosis not present

## 2020-09-07 DIAGNOSIS — F419 Anxiety disorder, unspecified: Secondary | ICD-10-CM | POA: Diagnosis not present

## 2020-09-10 ENCOUNTER — Encounter: Payer: Self-pay | Admitting: Orthopaedic Surgery

## 2020-09-10 ENCOUNTER — Ambulatory Visit (INDEPENDENT_AMBULATORY_CARE_PROVIDER_SITE_OTHER): Payer: Medicare Other | Admitting: Physician Assistant

## 2020-09-10 VITALS — Ht 68.0 in | Wt 149.0 lb

## 2020-09-10 DIAGNOSIS — M009 Pyogenic arthritis, unspecified: Secondary | ICD-10-CM

## 2020-09-10 NOTE — Progress Notes (Signed)
Post-Op Visit Note   Patient: Jeremy Holmes           Date of Birth: [birth year]           MRN: [id number] Visit Date: 09/10/2020 PCP: [name], MD   Assessment & Plan:  Chief Complaint:  Chief Complaint  Patient presents with   Right Knee - Follow-up   Visit Diagnoses: No diagnosis found.  Plan: Patient is a pleasant 87 year old gentleman who comes in today 3 weeks out irrigation debridement right septic knee joint following viscosupplementation injection, date of surgery 08/20/2020.  He still has his PICC line and is getting IV daptomycin and ceftriaxone.  This is scheduled to end on 09/17/2020.  Intraoperative cultures are negative to date.    He is being managed by infectious disease where he is scheduled to follow-up again on 09/18/2020.  He was seen in our office approximately 2 weeks ago where 15 cc of blood was aspirated from the right knee.  He has had pain to the right knee since.  Worse with flexion of the knee.  He has been using heat and trying to perform range of motion.  No fevers or chills.  Examination of the right knee shows a large effusion.  Range of motion 0 to 95 degrees.  Mild warmth.  No erythema no drainage.  At this point, he will continue with his IV antibiotics.  Continue with plan per infectious disease for oral antibiotics once the PICC line has been removed on the 23rd.  We will have him continue with range of motion exercises.  He will let pain be his guide.  Continue with heat and ice as needed.  Follow-up with [place] in 4 weeks time for recheck.  Call with concerns or questions.  Follow-Up Instructions: Return in about 4 weeks (around 10/08/2020).   Orders:  No orders of the defined types were placed in this encounter.  No orders of the defined types were placed in this encounter.   Imaging:   PMFS History: Patient Active Problem List   Diagnosis Date Noted   Medication monitoring encounter 09/02/2020   Septic arthritis of knee, right (HCC)  08/19/2020   Pes planus of left foot 03/12/2020   Unilateral primary osteoarthritis, right knee 10/15/2019   Baker cyst, right 10/15/2019   Generalized anxiety disorder 06/07/2014   Adjustment disorder with depressed mood 06/07/2014   Past Medical History:  Diagnosis Date   Anxiety    no meds   Arthritis    right knee   Depression    no meds   HLD (hyperlipidemia)    Pre-diabetes    diet controlled - no meds    No family history on file.  Past Surgical History:  Procedure Laterality Date   COLONOSCOPY     ELBOW SURGERY Right    EYE SURGERY Bilateral    cataracts surgery   KNEE ARTHROSCOPY Right 08/20/2020   Procedure: ARTHROSCOPY KNEE AND IRRIGATION AND DEBRIDEMENT;  Surgeon: Tarry Kos, MD;  Location: [facility];  Service: Orthopedics;  Laterality: Right;   SHOULDER SURGERY Bilateral    x 2   WISDOM TOOTH EXTRACTION     Social History   Occupational History   Not on file  Tobacco Use   Smoking status: Former    Pack years: 0.00    Types: Cigarettes   Smokeless tobacco: Never  Vaping Use   Vaping Use: Never used  Substance and Sexual Activity   Alcohol use: Yes  Alcohol/week: 7.0 standard drinks    Types: 7 Glasses of wine per week    Comment: at night with supper   Drug use: No   Sexual activity: Not on file

## 2020-09-14 DIAGNOSIS — M009 Pyogenic arthritis, unspecified: Secondary | ICD-10-CM | POA: Diagnosis not present

## 2020-09-17 DIAGNOSIS — M009 Pyogenic arthritis, unspecified: Secondary | ICD-10-CM | POA: Diagnosis not present

## 2020-09-18 ENCOUNTER — Other Ambulatory Visit: Payer: Self-pay

## 2020-09-18 ENCOUNTER — Ambulatory Visit: Payer: Medicare Other | Admitting: Infectious Diseases

## 2020-09-18 VITALS — BP 170/70 | HR 74 | Resp 16 | Ht 68.0 in | Wt 140.0 lb

## 2020-09-18 DIAGNOSIS — M009 Pyogenic arthritis, unspecified: Secondary | ICD-10-CM

## 2020-09-18 DIAGNOSIS — Z5181 Encounter for therapeutic drug level monitoring: Secondary | ICD-10-CM

## 2020-09-18 NOTE — Progress Notes (Signed)
Per verbal order from Dr. Elinor Parkinson, 38 cm PICC removed from right [place], tip intact. No sutures present. RN confirmed length per chart. Dressing was clean and dry. Petroleum dressing applied. Patient advised no heavy lifting with this arm and to leave dressing in place for 24 hours and not to shower affected arm for 24 hours. Advised patient to call office or seek emergency medical care if dressing becomes soaked with blood, swelling, or sharp pain presents. Advised patient to seek emergent care if develops neurological symptoms, chest pain, or shortness of breath. Patient verbalized understanding and agreement. RN answered patient's questions. Patient tolerated procedure well and RN walked patient to check out. RN notified Melissa at [facility] of removal.    Sandie Ano, RN

## 2020-09-18 NOTE — Progress Notes (Signed)
[facility]                                                             [address], [place], [id number]                                                                  Phn. [phone]; Fax: [phone]                                                                             Date: 09/18/20  Reason for Referral: HFU Septic arthritis of rt foot  Assessment Problem List Items Addressed This Visit       Musculoskeletal and Integument   Pyogenic arthritis of right knee joint (HCC) - Primary     Other   Medication monitoring encounter    Right knee septic arthritis, native joint In the setting of prior cortisone injections and visco-gel injections S/p right knee arthroscopy with major synovectomy, arthroscopic I&D and aspiration of right knee join on 5/26. OR cultures no growth so far with no growth in gram stain.  Medication Monitoring - HH labs reviewed  Plan Completed 4 weeks of IV antibiotics on 09/17/20. RT knee is not swollen/warm/tender with significant improvement  Will DC PICC line and IV antibiotics Fu as needed   All questions and concerns were discussed and addressed. Patient verbalized understanding of the plan. ____________________________________________________________________________________________________________________  HPI: 87 year old male with past medical history of right knee arthritis, prediabetes, hyperlipidemia, anxiety and depression here for HFU for rt knee native joint septic arthritis. Patient was recently hospitalized 5/26-5/31/22 for rt knee septic arthritis in the setting of cortisone and viscogel injections. He was seen in the office on 5/25 where it was noted that he had a tense right knee effusion with warmth, fullness in the popliteal fossa with tenderness.  Approximately 100 cc of purulent fluid was aspirated from his right knee.   Synovial fluid analysis with 68, 210 nucleated cells with 93% neutrophils.  Patient directly admitted for I&D of right knee.  Patient underwent right knee arthroscopy with major synovectomy, arthroscopic I&D and aspiration of right knee joint.  OR cultures no growth so far. He was discharged from hospital on 5/31 to complete 4 weeks of IV daptomycin and ceftriaxone.   He was seen by Ortho on 6/2 and was thought to be doing well, sutures removed. 15 CC of blood was removed from rt knee. He has been getting IV antibiotics without any issues. Denies fevers, chills, sweats. Denies nausea, vomiting and diarrhea. Denies any issues with PICC line. Has mild to moderate knee pain while moving around but none while sitting still. He is able to flex, extend his knee and walk without difficulty. Discussed with plan to continue  antibiotics for 2 more weeks.   09/18/20 He tells me that he was started on anti-inflammatory which has helped with his swelling and warmth.  Denies any fever, chills, sweats.  Denies any nausea,, vomiting diarrhea.  He is getting IV antibiotics as instructed, and antibiotics stopped on  6/23.  Denies any issues with the PICC line.  He is happy with the progress and has no complaints today  ROS: Negative for fever, chills, activity change, appetite change, fatigue and unexpected weight change.   Negative for chest pain/sob and cough   Past Medical History:  Diagnosis Date   Anxiety    no meds   Arthritis    right knee   Depression    no meds   HLD (hyperlipidemia)    Pre-diabetes    diet controlled - no meds   Past Surgical History:  Procedure Laterality Date   COLONOSCOPY     ELBOW SURGERY Right    EYE SURGERY Bilateral    cataracts surgery   KNEE ARTHROSCOPY Right 08/20/2020   Procedure: ARTHROSCOPY KNEE AND IRRIGATION AND DEBRIDEMENT;  Surgeon: Tarry KosXu, Naiping M, MD;  Location: [facility] OR;  Service: Orthopedics;  Laterality: Right;   SHOULDER SURGERY Bilateral    x 2   WISDOM TOOTH  EXTRACTION     Current Outpatient Medications on File Prior to Visit  Medication Sig Dispense Refill   acetaminophen (TYLENOL) 500 MG tablet Take 1,000 mg by mouth every 6 (six) hours as needed for moderate pain.     Ascorbic Acid (VITAMIN C WITH ROSE HIPS) 1000 MG tablet Take 1,000 mg by mouth daily.     aspirin 81 MG chewable tablet Chew 81 mg by mouth daily.     atorvastatin (LIPITOR) 20 MG tablet Take 20 mg by mouth daily.     CINNAMON PO Take 1,000 mg by mouth daily.     methocarbamol (ROBAXIN) 500 MG tablet Take 1 tablet (500 mg total) by mouth every 8 (eight) hours as needed for muscle spasms. 30 tablet 1   Multiple Vitamins-Minerals (CENTRUM SILVER PO) Take 1 tablet by mouth daily.     Omega-3 Fatty Acids (FISH OIL) 1200 MG CAPS Take 1 capsule by mouth daily.     No current facility-administered medications on file prior to visit.    No Known Allergies  Social History   Socioeconomic History   Marital status: Married    Spouse name: Not on file   Number of children: Not on file   Years of education: Not on file   Highest education level: Not on file  Occupational History   Not on file  Tobacco Use   Smoking status: Former    Pack years: 0.00    Types: Cigarettes   Smokeless tobacco: Never  Vaping Use   Vaping Use: Never used  Substance and Sexual Activity   Alcohol use: Yes    Alcohol/week: 7.0 standard drinks    Types: 7 Glasses of wine per week    Comment: at night with supper   Drug use: No   Sexual activity: Not on file  Other Topics Concern   Not on file  Social History Narrative   Not on file   Social Determinants of Health   Financial Resource Strain: Not on file  Food Insecurity: Not on file  Transportation Needs: Not on file  Physical Activity: Not on file  Stress: Not on file  Social Connections: Not on file  Intimate Partner Violence: Not on file  Vitals BP (!) 170/70   Pulse 74   Resp 16   Ht 5\' 8"  (1.727 m)   Wt 140 lb (63.5 kg)    SpO2 98%   BMI 21.29 kg/m    Examination  General - not in acute distress, comfortably sitting in chair HEENT - PEERLA, no pallor and no icterus Chest - b/l clear air entry, no additional sounds CVS- Normal s1s2, RRR Abdomen - Soft, Non tender , non distended Ext- no pedal edema Rt knee - no swelling/warmth and tenderness, ROM good Neuro: grossly normal Back - WNL Psych : calm and cooperative   Recent labs CBC Latest Ref Rng & Units 08/20/2020  WBC 4.0 - 10.5 K/uL 11.3(H)  Hemoglobin 13.0 - 17.0 g/dL 08/22/2020  Hematocrit 61.4 - 52.0 % 40.4  Platelets 150 - 400 K/uL 248   CMP Latest Ref Rng & Units 08/20/2020  Glucose 70 - 99 mg/dL 08/22/2020)  BUN 8 - 23 mg/dL 16  Creatinine 540(G - 8.67 mg/dL 6.19  Sodium 5.09 - 326 mmol/L 138  Potassium 3.5 - 5.1 mmol/L 3.6  Chloride 98 - 111 mmol/L 104  CO2 22 - 32 mmol/L 26  Calcium 8.9 - 10.3 mg/dL 9.0     Pertinent Microbiology Results for orders placed or performed during the hospital encounter of 08/20/20  SARS Coronavirus 2 by RT PCR (hospital order, performed in [facility] hospital lab) Nasopharyngeal Nasopharyngeal Swab     Status: None   Collection Time: 08/20/20 10:12 AM   Specimen: Nasopharyngeal Swab  Result Value Ref Range Status   SARS Coronavirus 2 NEGATIVE NEGATIVE Final    Comment: (NOTE) SARS-CoV-2 target nucleic acids are NOT DETECTED.  The SARS-CoV-2 RNA is generally detectable in upper and lower respiratory specimens during the acute phase of infection. The lowest concentration of SARS-CoV-2 viral copies this assay can detect is 250 copies / mL. A negative result does not preclude SARS-CoV-2 infection and should not be used as the sole basis for treatment or other patient management decisions.  A negative result may occur with improper specimen collection / handling, submission of specimen other than nasopharyngeal swab, presence of viral mutation(s) within the areas targeted by this assay, and inadequate number of  viral copies (<250 copies / mL). A negative result must be combined with clinical observations, patient history, and epidemiological information.  Fact Sheet for Patients:   08/22/20  Fact Sheet for Healthcare Providers: BoilerBrush.com.cy  This test is not yet approved or  cleared by the https://pope.com/ FDA and has been authorized for detection and/or diagnosis of SARS-CoV-2 by FDA under an Emergency Use Authorization (EUA).  This EUA will remain in effect (meaning this test can be used) for the duration of the COVID-19 declaration under Section 564(b)(1) of the Act, 21 U.S.C. section 360bbb-3(b)(1), unless the authorization is terminated or revoked sooner.  Performed at [facility], [address], [place], [place]   Aerobic/Anaerobic Culture w Gram Stain (surgical/deep wound)     Status: None   Collection Time: 08/20/20  2:50 PM   Specimen: Fluid  Result Value Ref Range Status   Specimen Description FLUID  Final   Special Requests RIGHT KNEE SYNOVIAL FLUID  Final   Gram Stain   Final    MODERATE WBC PRESENT, PREDOMINANTLY PMN NO ORGANISMS SEEN    Culture   Final    No growth aerobically or anaerobically. Performed at [facility], [address], [place], [place]  Report Status 08/25/2020 FINAL  Final    Pertinent Imaging All pertinent labs/Imagings/notes reviewed. All pertinent plain films and CT images have been personally visualized and interpreted; radiology reports have been reviewed. Decision making incorporated into the Impression / Recommendations.  I have spent 60 minutes for this patient encounter including  review of prior medical records with greater than 50% of time in face to face counsel of the patient/discussing diagnostics and plan of care.   Electronically signed by:  Odette Fraction, MD Infectious Disease Physician [facility] [address] [place], [place] [address] Phone: [phone]  Fax: [phone]

## 2020-09-19 DIAGNOSIS — M009 Pyogenic arthritis, unspecified: Secondary | ICD-10-CM | POA: Insufficient documentation

## 2020-10-08 ENCOUNTER — Other Ambulatory Visit: Payer: Self-pay

## 2020-10-08 ENCOUNTER — Ambulatory Visit (INDEPENDENT_AMBULATORY_CARE_PROVIDER_SITE_OTHER): Payer: Medicare Other | Admitting: Orthopaedic Surgery

## 2020-10-08 ENCOUNTER — Encounter: Payer: Self-pay | Admitting: Orthopaedic Surgery

## 2020-10-08 VITALS — Ht 68.0 in | Wt 140.0 lb

## 2020-10-08 DIAGNOSIS — M009 Pyogenic arthritis, unspecified: Secondary | ICD-10-CM

## 2020-10-08 NOTE — Progress Notes (Signed)
Post-Op Visit Note   Patient: [place]           Date of Birth: 03-Mar-1934           MRN: [id number] Visit Date: 10/08/2020 PCP: [name], MD   Assessment & Plan:  Chief Complaint:  Chief Complaint  Patient presents with   Right Knee - Follow-up    I&D right knee 08/20/2020   Visit Diagnoses:  1. Pyogenic arthritis of right knee joint, due to unspecified organism [facility])     Plan: Patient is a 87 year old gentleman status post arthroscopic right knee washout on 08/20/2020.  He is doing better overall.  He has completed IV antibiotics and has been released by the ID clinic.  Cultures eventually did not grow anything.  He has been working on range of motion to the right knee at home.  He feels like there is some residual swelling that is preventing him from regaining full range of motion.  Right knee shows fully healed surgical scar.  Definitely has some persistent swelling and edema that is preventing him from regaining full range of motion.  No signs of infection.  I feel that he does not really need formal outpatient PT as he is very motivated and compliant with home exercises.  If he changes his mind I am happy to send a formal referral.  Otherwise we can see him back as needed as I expect this to continue to improve and he should range of motion eventually.  Follow-Up Instructions: Return if symptoms worsen or fail to improve.   Orders:  No orders of the defined types were placed in this encounter.  No orders of the defined types were placed in this encounter.   Imaging: No results found.  PMFS History: Patient Active Problem List   Diagnosis Date Noted   Pyogenic arthritis of right knee joint (HCC) 09/19/2020   Medication monitoring encounter 09/02/2020   Septic arthritis of knee, right (HCC) 08/19/2020   Pes planus of left foot 03/12/2020   Unilateral primary osteoarthritis, right knee 10/15/2019   Baker cyst, right 10/15/2019   Generalized anxiety  disorder 06/07/2014   Adjustment disorder with depressed mood 06/07/2014   Past Medical History:  Diagnosis Date   Anxiety    no meds   Arthritis    right knee   Depression    no meds   HLD (hyperlipidemia)    Pre-diabetes    diet controlled - no meds    History reviewed. No pertinent family history.  Past Surgical History:  Procedure Laterality Date   COLONOSCOPY     ELBOW SURGERY Right    EYE SURGERY Bilateral    cataracts surgery   KNEE ARTHROSCOPY Right 08/20/2020   Procedure: ARTHROSCOPY KNEE AND IRRIGATION AND DEBRIDEMENT;  Surgeon: Tarry Kos, MD;  Location: [facility] OR;  Service: Orthopedics;  Laterality: Right;   SHOULDER SURGERY Bilateral    x 2   WISDOM TOOTH EXTRACTION     Social History   Occupational History   Not on file  Tobacco Use   Smoking status: Former    Types: Cigarettes   Smokeless tobacco: Never  Vaping Use   Vaping Use: Never used  Substance and Sexual Activity   Alcohol use: Yes    Alcohol/week: 7.0 standard drinks    Types: 7 Glasses of wine per week    Comment: at night with supper   Drug use: No   Sexual activity: Not on file

## 2020-11-03 DIAGNOSIS — E119 Type 2 diabetes mellitus without complications: Secondary | ICD-10-CM | POA: Diagnosis not present

## 2020-11-04 ENCOUNTER — Telehealth: Payer: Self-pay | Admitting: Specialist

## 2020-11-04 NOTE — Telephone Encounter (Signed)
Stephanie with [facility] called stating she faxed a form that needed to be signed in regards to the pts medications. She states she hasn't received anything back and would like to make sure we received the form so we can sign and send it back.   CB# [phone] Fax# [phone]

## 2020-11-06 NOTE — Telephone Encounter (Signed)
Faxed 8/9 to [phone] fax # on form. I refaxed to new number

## 2020-11-13 DIAGNOSIS — L570 Actinic keratosis: Secondary | ICD-10-CM | POA: Diagnosis not present

## 2020-11-20 ENCOUNTER — Encounter: Payer: Self-pay | Admitting: Orthopaedic Surgery

## 2020-11-20 ENCOUNTER — Other Ambulatory Visit: Payer: Self-pay

## 2020-11-20 ENCOUNTER — Ambulatory Visit (INDEPENDENT_AMBULATORY_CARE_PROVIDER_SITE_OTHER): Payer: Medicare Other | Admitting: Physician Assistant

## 2020-11-20 DIAGNOSIS — M009 Pyogenic arthritis, unspecified: Secondary | ICD-10-CM

## 2020-11-20 NOTE — Progress Notes (Signed)
   Post-Op Visit Note   Patient: [place]           Date of Birth: 1933-06-14           MRN: [id number] Visit Date: 11/20/2020 PCP: [name], MD   Assessment & Plan:  Chief Complaint:  Chief Complaint  Patient presents with   Right Knee - Follow-up   Visit Diagnoses:  1. Pyogenic arthritis of right knee joint, due to unspecified organism [facility])     Plan: Patient is a pleasant 87 year old gentleman who comes in today 3 months status post right knee I&D from septic arthritis, date of surgery 08/20/2021.  He has been doing well.  He denies any pain to the knee.  He has completely finished in the IV and oral antibiotics.  He still complains of limited range of motion despite performing rigorous home exercise program.  Examination of the right knee reveals moderate swelling.  Range of motion from about 5 to 100 degrees.  No joint line tenderness.  No signs of infection.  He is neurovascular intact distally.  At this point, we have discussed referring him to formal physical therapy, but I am somewhat hesitant that he will make significant progress based on his continued swelling which I think is limiting his flexion.  He will continue to ice as well.  He will follow-up with [place] as needed.  Follow-Up Instructions: Return if symptoms worsen or fail to improve.   Orders:  Orders Placed This Encounter  Procedures   Ambulatory referral to Physical Therapy    No orders of the defined types were placed in this encounter.   Imaging: No new imaging  PMFS History: Patient Active Problem List   Diagnosis Date Noted   Pyogenic arthritis of right knee joint (HCC) 09/19/2020   Medication monitoring encounter 09/02/2020   Septic arthritis of knee, right (HCC) 08/19/2020   Pes planus of left foot 03/12/2020   Unilateral primary osteoarthritis, right knee 10/15/2019   Baker cyst, right 10/15/2019   Generalized anxiety disorder 06/07/2014   Adjustment disorder with depressed mood  06/07/2014   Past Medical History:  Diagnosis Date   Anxiety    no meds   Arthritis    right knee   Depression    no meds   HLD (hyperlipidemia)    Pre-diabetes    diet controlled - no meds    History reviewed. No pertinent family history.  Past Surgical History:  Procedure Laterality Date   COLONOSCOPY     ELBOW SURGERY Right    EYE SURGERY Bilateral    cataracts surgery   KNEE ARTHROSCOPY Right 08/20/2020   Procedure: ARTHROSCOPY KNEE AND IRRIGATION AND DEBRIDEMENT;  Surgeon: Tarry Kos, MD;  Location: [facility] OR;  Service: Orthopedics;  Laterality: Right;   SHOULDER SURGERY Bilateral    x 2   WISDOM TOOTH EXTRACTION     Social History   Occupational History   Not on file  Tobacco Use   Smoking status: Former    Types: Cigarettes   Smokeless tobacco: Never  Vaping Use   Vaping Use: Never used  Substance and Sexual Activity   Alcohol use: Yes    Alcohol/week: 7.0 standard drinks    Types: 7 Glasses of wine per week    Comment: at night with supper   Drug use: No   Sexual activity: Not on file

## 2020-11-24 ENCOUNTER — Ambulatory Visit: Payer: Medicare Other | Admitting: Physical Therapy

## 2020-12-01 ENCOUNTER — Encounter: Payer: Self-pay | Admitting: Rehabilitative and Restorative Service Providers"

## 2020-12-01 ENCOUNTER — Other Ambulatory Visit: Payer: Self-pay

## 2020-12-01 ENCOUNTER — Ambulatory Visit: Payer: Medicare Other | Admitting: Rehabilitative and Restorative Service Providers"

## 2020-12-01 DIAGNOSIS — M6281 Muscle weakness (generalized): Secondary | ICD-10-CM | POA: Diagnosis not present

## 2020-12-01 DIAGNOSIS — G8929 Other chronic pain: Secondary | ICD-10-CM

## 2020-12-01 DIAGNOSIS — M25661 Stiffness of right knee, not elsewhere classified: Secondary | ICD-10-CM | POA: Diagnosis not present

## 2020-12-01 DIAGNOSIS — R600 Localized edema: Secondary | ICD-10-CM

## 2020-12-01 DIAGNOSIS — M25561 Pain in right knee: Secondary | ICD-10-CM

## 2020-12-01 DIAGNOSIS — R262 Difficulty in walking, not elsewhere classified: Secondary | ICD-10-CM | POA: Diagnosis not present

## 2020-12-01 NOTE — Therapy (Addendum)
[facility] Physical Therapy [address] [place], [place], [address] Phone: [phone]   Fax:  [phone]  Physical Therapy Evaluation  Patient Details  Name: Jeremy Holmes MRN: [id number] Date of Birth: 11/08/1933 Referring Provider (PT): Aundra Dubin PA-C  PHYSICAL THERAPY DISCHARGE SUMMARY  Visits from Start of Care: 1  Current functional level related to goals / functional outcomes: Eval only, did not return for follow-up visits   Remaining deficits: See note   Education / Equipment: HEP   Patient agrees to discharge. Patient goals were not met. Patient is being discharged due to not returning since the last visit.   Encounter Date: 12/01/2020   PT End of Session - 12/01/20 1608     Visit Number 1    Number of Visits 12    Date for PT Re-Evaluation 01/26/21    Progress Note Due on Visit 10    PT Start Time 1508    PT Stop Time 1553    PT Time Calculation (min) 45 min    Activity Tolerance No increased pain;Patient tolerated treatment well    Behavior During Therapy WFL for tasks assessed/performed             Past Medical History:  Diagnosis Date   Anxiety    no meds   Arthritis    right knee   Depression    no meds   HLD (hyperlipidemia)    Pre-diabetes    diet controlled - no meds    Past Surgical History:  Procedure Laterality Date   COLONOSCOPY     ELBOW SURGERY Right    EYE SURGERY Bilateral    cataracts surgery   KNEE ARTHROSCOPY Right 08/20/2020   Procedure: ARTHROSCOPY KNEE AND IRRIGATION AND DEBRIDEMENT;  Surgeon: Leandrew Koyanagi, MD;  Location: [facility];  Service: Orthopedics;  Laterality: Right;   SHOULDER SURGERY Bilateral    x 2   WISDOM TOOTH EXTRACTION      There were no vitals filed for this visit.    Subjective Assessment - 12/01/20 1605     Subjective Pete had a septic R knee, possibly as a result of gel shots in his R knee.  He is cleared of the infection but still has typical stiffness and weakness  associated with OA.    Pertinent History Baker cyst R knee, R knee septic arthritis, scope 08/20/2020 R knee, B shoulder and R elbow surgery in [place] hold activities;Standing;Walking    How long can you walk comfortably? Affects golfing    Patient Stated Goals Be able to walk and golf longer without an increase in pain    Currently in Pain? Yes    Pain Score 4     Pain Location Knee    Pain Orientation Right    Pain Descriptors / Indicators Aching;Tightness;Constant    Pain Type Chronic pain    Pain Radiating Towards NA    Pain Onset More than a month ago    Pain Frequency Constant    Aggravating Factors  Prolonged postures    Pain Relieving Factors Change of position    Effect of Pain on Daily Activities Affects comfort with golf and walking    Multiple Pain Sites No                [facility] PT Assessment - 12/01/20 0001       Assessment   Medical Diagnosis R knee pyogenic arthritis    Referring Provider (PT) Stanton Kidney  L Stanbery PA-C    Onset Date/Surgical Date 08/17/20      Restrictions   Weight Bearing Restrictions No      Balance Screen   Has the patient fallen in the past 6 months No    Has the patient had a decrease in activity level because of a fear of falling?  No    Is the patient reluctant to leave their home because of a fear of falling?  No      Home Ecologist residence    Additional Comments Stairs are no problem      Prior Function   Level of Independence Independent    Vocation Retired    Leisure Agricultural consultant   Overall Cognitive Status Within Functional Limits for tasks assessed      Observation/Other Assessments   Focus on Therapeutic Outcomes (FOTO)  52 (Goal 76 by visit 12)      ROM / Strength   AROM / PROM / Strength AROM;Strength      AROM   Overall AROM  Deficits    AROM Assessment Site Knee    Right/Left Knee Left;Right    Right Knee Extension -5    Right Knee Flexion 116    Left  Knee Extension 0    Left Knee Flexion 142      Strength   Overall Strength Deficits    Strength Assessment Site Knee    Right/Left Knee Left;Right    Right Knee Extension --   52.2 pounds   Left Knee Extension --   68.7 pounds                       Objective measurements completed on examination: See above findings.       [facility] Adult PT Treatment/Exercise - 12/01/20 0001       Exercises   Exercises Knee/Hip      Knee/Hip Exercises: Seated   Long Arc Quad Strengthening;Both;3 sets;5 reps;Limitations    Long Arc Quad Limitations Seated straight leg raises    Other Seated Knee/Hip Exercises Tailgate knee flexion AROM 3 minutes      Knee/Hip Exercises: Supine   Quad Sets Strengthening;Both;1 set;10 reps;Limitations    Quad Sets Limitations 5 seconds (toes back, press down and tighten thighs)                    PT Education - 12/01/20 1608     Education Details Reviewed exam findings and starter HEP    Person(s) Educated Patient    Methods Explanation;Demonstration;Tactile cues;Verbal cues;Handout    Comprehension Verbal cues required;Need further instruction;Returned demonstration;Verbalized understanding;Tactile cues required              PT Short Term Goals - 12/01/20 1612       PT SHORT TERM GOAL #1   Title Improve R knee AROM to -3 to 120 degrees.    Baseline -5 to 116 degrees    Time 4    Period Weeks    Status New    Target Date 12/29/20      PT SHORT TERM GOAL #2   Title [name] will be independent with his day 1 HEP.    Time 4    Period Weeks    Status New    Target Date 12/29/20               PT Long Term Goals - 12/01/20 1614  PT LONG TERM GOAL #1   Title Improve FOTO to 76 in 12 visits or less.    Baseline 52    Time 8    Period Weeks    Status New    Target Date 01/26/21      PT LONG TERM GOAL #2   Title Decrease R knee pain to consistently 0-3/10 on the Numeric Pain Rating Scale.    Baseline Can  require medication (4+/10)    Time 8    Period Weeks    Status New    Target Date 01/26/21      PT LONG TERM GOAL #3   Title Improve R quadriceps strength to at least 70 pounds.    Baseline 52.2 pounds    Time 8    Period Weeks    Status New    Target Date 01/26/21      PT LONG TERM GOAL #4   Title [name] will be independent with his long-term HEP at [place].    Time 8    Period Weeks    Status New    Target Date 01/26/21                    Plan - 12/01/20 1608     Clinical Impression Statement [name] has edema, AROM and quadriceps strength impairments consistent with R knee arthritis.  He will be set-up on a strengthening heavy program to improve quadriceps strength, reduce edema and improve R knee AROM.  His prognosis to meet LTGs is good with the recommended POC.    Personal Factors and Comorbidities Age;Comorbidity 3+    Comorbidities Baker cyst R knee, R knee septic arthritis, scope 08/20/2020 R knee, B shoulder and R elbow surgery in [facility]    Examination-Activity Limitations Sit;Bend;Lift;Squat;Locomotion Level    Examination-Participation Restrictions Community Activity    Stability/Clinical Decision Making Stable/Uncomplicated    Clinical Decision Making Low    Rehab Potential Good    PT Frequency --   1-2X/week   PT Duration --   6-8 weeks   PT Treatment/Interventions ADLs/Self Care Home Management;Electrical Stimulation;Cryotherapy;Gait training;Therapeutic activities;Neuromuscular re-education;Therapeutic exercise;Patient/family education;Vasopneumatic Device    PT Next Visit Plan Knee extension machine 90-30 degrees with slow eccentrics, leg press, bike, review HEP, quadriceps strength focused with AROM (extension and flexion) PRN.    PT Home Exercise Plan Access Code: [email or web address]  URL: [email or web address]  Date: 12/01/2020  Prepared by: Vista Mink    Exercises  Supine Quadricep Sets - 2-3 x daily - 7 x weekly - 2-3 sets - 10 reps - 5 second hold  Small  Range Straight Leg Raise - 1-2 x daily - 7 x weekly - 4-10 sets - 5 reps - 3 seconds hold  Seated Knee Flexion AAROM - 3-5 x daily - 7 x weekly - 1 sets - 1 reps - 3 minutes hold    Consulted and Agree with Plan of Care Patient             Patient will benefit from skilled therapeutic intervention in order to improve the following deficits and impairments:  Abnormal gait, Decreased activity tolerance, Decreased endurance, Decreased range of motion, Decreased strength, Difficulty walking, Increased edema, Impaired flexibility, Pain  Visit Diagnosis: Difficulty in walking, not elsewhere classified  Muscle weakness (generalized)  Localized edema  Stiffness of right knee, not elsewhere classified  Chronic pain of right knee     Problem List Patient Active Problem List   Diagnosis Date Noted   Pyogenic  arthritis of right knee joint ([place]) 09/19/2020   Medication monitoring encounter 09/02/2020   Septic arthritis of knee, right ([place]) 08/19/2020   Pes planus of left foot 03/12/2020   Unilateral primary osteoarthritis, right knee 10/15/2019   Baker cyst, right 10/15/2019   Generalized anxiety disorder 06/07/2014   Adjustment disorder with depressed mood 06/07/2014    Farley Ly PT, MPT 12/01/2020, 4:17 PM  [facility] Physical Therapy [address] [place], [place], [address] Phone: [phone]   Fax:  [phone]  Name: CATO LIBURD MRN: [id number] Date of Birth: [birth year]

## 2020-12-01 NOTE — Patient Instructions (Signed)
Access Code: [email or web address] URL: [email or web address] Date: 12/01/2020 Prepared by: Pauletta Browns  Exercises Supine Quadricep Sets - 2-3 x daily - 7 x weekly - 2-3 sets - 10 reps - 5 second hold Small Range Straight Leg Raise - 1-2 x daily - 7 x weekly - 4-10 sets - 5 reps - 3 seconds hold Seated Knee Flexion AAROM - 3-5 x daily - 7 x weekly - 1 sets - 1 reps - 3 minutes hold

## 2020-12-15 ENCOUNTER — Encounter: Payer: Medicare Other | Admitting: Rehabilitative and Restorative Service Providers"

## 2020-12-18 ENCOUNTER — Encounter: Payer: Medicare Other | Admitting: Rehabilitative and Restorative Service Providers"

## 2020-12-22 ENCOUNTER — Encounter: Payer: Medicare Other | Admitting: Rehabilitative and Restorative Service Providers"

## 2020-12-24 ENCOUNTER — Encounter: Payer: Medicare Other | Admitting: Rehabilitative and Restorative Service Providers"

## 2020-12-29 ENCOUNTER — Encounter: Payer: Medicare Other | Admitting: Rehabilitative and Restorative Service Providers"

## 2020-12-30 DIAGNOSIS — K4090 Unilateral inguinal hernia, without obstruction or gangrene, not specified as recurrent: Secondary | ICD-10-CM | POA: Diagnosis not present

## 2020-12-30 DIAGNOSIS — Z23 Encounter for immunization: Secondary | ICD-10-CM | POA: Diagnosis not present

## 2020-12-31 ENCOUNTER — Encounter: Payer: Medicare Other | Admitting: Rehabilitative and Restorative Service Providers"

## 2021-01-13 ENCOUNTER — Ambulatory Visit: Payer: Self-pay | Admitting: Surgery

## 2021-01-13 DIAGNOSIS — K4090 Unilateral inguinal hernia, without obstruction or gangrene, not specified as recurrent: Secondary | ICD-10-CM | POA: Diagnosis not present

## 2021-04-12 DIAGNOSIS — K429 Umbilical hernia without obstruction or gangrene: Secondary | ICD-10-CM | POA: Diagnosis not present

## 2021-04-20 ENCOUNTER — Ambulatory Visit (INDEPENDENT_AMBULATORY_CARE_PROVIDER_SITE_OTHER): Payer: Medicare Other | Admitting: Ophthalmology

## 2021-04-20 ENCOUNTER — Encounter (INDEPENDENT_AMBULATORY_CARE_PROVIDER_SITE_OTHER): Payer: Self-pay | Admitting: Ophthalmology

## 2021-04-20 ENCOUNTER — Other Ambulatory Visit: Payer: Self-pay

## 2021-04-20 DIAGNOSIS — H353131 Nonexudative age-related macular degeneration, bilateral, early dry stage: Secondary | ICD-10-CM

## 2021-04-20 DIAGNOSIS — Z9889 Other specified postprocedural states: Secondary | ICD-10-CM | POA: Diagnosis not present

## 2021-04-20 NOTE — Progress Notes (Signed)
04/20/2021     CHIEF COMPLAINT Patient presents for  Chief Complaint  Patient presents with   Retina Evaluation      HISTORY OF PRESENT ILLNESS: Jeremy Holmes is a 88 y.o. male who presents to the clinic today for:     Referring physician: Jethro BolusShapiro, Mark, MD [address] [place],  [place] [id number]  HISTORICAL INFORMATION:   Selected notes from the MEDICAL RECORD NUMBER       CURRENT MEDICATIONS: No current outpatient medications on file. (Ophthalmic Drugs)   No current facility-administered medications for this visit. (Ophthalmic Drugs)   Current Outpatient Medications (Other)  Medication Sig   acetaminophen (TYLENOL) 500 MG tablet Take 1,000 mg by mouth every 6 (six) hours as needed for moderate pain.   Ascorbic Acid (VITAMIN C WITH ROSE HIPS) 1000 MG tablet Take 1,000 mg by mouth daily.   aspirin 81 MG chewable tablet Chew 81 mg by mouth daily.   atorvastatin (LIPITOR) 20 MG tablet Take 20 mg by mouth daily.   CINNAMON PO Take 1,000 mg by mouth daily.   methocarbamol (ROBAXIN) 500 MG tablet Take 1 tablet (500 mg total) by mouth every 8 (eight) hours as needed for muscle spasms.   Multiple Vitamins-Minerals (CENTRUM SILVER PO) Take 1 tablet by mouth daily.   Omega-3 Fatty Acids (FISH OIL) 1200 MG CAPS Take 1 capsule by mouth daily.   No current facility-administered medications for this visit. (Other)      REVIEW OF SYSTEMS:    ALLERGIES No Known Allergies  PAST MEDICAL HISTORY Past Medical History:  Diagnosis Date   Anxiety    no meds   Arthritis    right knee   Depression    no meds   HLD (hyperlipidemia)    Pre-diabetes    diet controlled - no meds   Past Surgical History:  Procedure Laterality Date   COLONOSCOPY     ELBOW SURGERY Right    EYE SURGERY Bilateral    cataracts surgery   KNEE ARTHROSCOPY Right 08/20/2020   Procedure: ARTHROSCOPY KNEE AND IRRIGATION AND DEBRIDEMENT;  Surgeon: Tarry KosXu, Naiping M, MD;  Location: [facility];  Service:  Orthopedics;  Laterality: Right;   SHOULDER SURGERY Bilateral    x 2   WISDOM TOOTH EXTRACTION      FAMILY HISTORY History reviewed. No pertinent family history.  SOCIAL HISTORY Social History   Tobacco Use   Smoking status: Former    Types: Cigarettes   Smokeless tobacco: Never  Vaping Use   Vaping Use: Never used  Substance Use Topics   Alcohol use: Yes    Alcohol/week: 7.0 standard drinks    Types: 7 Glasses of wine per week    Comment: at night with supper   Drug use: No         OPHTHALMIC EXAM:  Base Eye Exam     Visual Acuity (ETDRS)       Right Left   Dist [place] 20/20 -2 20/20 -1         Tonometry (Tonopen, 9:53 AM)       Right Left   Pressure 18 19         Pupils       Pupils APD   Right PERRL None   Left PERRL None         Visual Fields       Left Right    Full Full         Extraocular Movement  Right Left    Full, Ortho Full, Ortho         Neuro/Psych     Oriented x3: Yes         Dilation     Both eyes: 1.0% Mydriacyl, 2.5% Phenylephrine @ 9:51 AM           Slit Lamp and Fundus Exam     External Exam       Right Left   External Normal Normal         Slit Lamp Exam       Right Left   Lids/Lashes Normal Normal   Conjunctiva/Sclera White and quiet White and quiet   Cornea Clear Clear   Anterior Chamber Deep and quiet Deep and quiet   Iris Centered posterior chamber intraocular lens Centered posterior chamber intraocular lens   Lens Clear Clear   Anterior Vitreous Normal Normal         Fundus Exam       Right Left   Posterior Vitreous Clear avitric Normal   Disc Normal Normal   C/D Ratio 0.0 0.1   Macula Normal Normal   Vessels Normal Normal   Periphery Normal Normal            IMAGING AND PROCEDURES  Imaging and Procedures for 04/20/21  OCT, Retina - OU - Both Eyes       Right Eye Quality was good. Scan locations included subfoveal. Central Foveal Thickness: 420.  Progression has been stable. Findings include abnormal foveal contour.   Left Eye Quality was good. Scan locations included subfoveal. Central Foveal Thickness: 420. Progression has been stable. Findings include abnormal foveal contour.   Notes Post vitrectomy and membrane peel right eye, slight assistance of macular thickening, not pathologic in either eye.             ASSESSMENT/PLAN:  History of vitrectomy OD looks great  Early stage nonexudative age-related macular degeneration of both eyes The nature of age--related macular degeneration was discussed with the patient as well as the distinction between dry and wet types. Checking an Amsler Grid daily with advice to return immediately should a distortion develop, was given to the patient. The patient 's smoking status now and in the past was determined and advice based on the AREDS study was provided regarding the consumption of antioxidant supplements. AREDS 2 vitamin formulation was recommended. Consumption of dark leafy vegetables and fresh fruits of various colors was recommended. Treatment modalities for wet macular degeneration particularly the use of intravitreal injections of anti-blood vessel growth factors was discussed with the patient. Avastin, Lucentis, and Eylea are the available options. On occasion, therapy includes the use of photodynamic therapy and thermal laser. Stressed to the patient do not rub eyes.  Patient was advised to check Amsler Grid daily and return immediately if changes are noted. Instructions on using the grid were given to the patient. All patient questions were answered.  OU Minor early no high risk features optional use of oral vitamin supplementation     ICD-10-CM   1. Early stage nonexudative age-related macular degeneration of both eyes  H35.3131 OCT, Retina - OU - Both Eyes    2. History of vitrectomy  Z98.890       1.  2.  3.  Ophthalmic Meds Ordered this visit:  No orders of the  defined types were placed in this encounter.      Return in about 2 years (around 04/21/2023) for DILATE OU, COLOR FP, OCT.  There are no Patient Instructions on file for this visit.   Explained the diagnoses, plan, and follow up with the patient and they expressed understanding.  Patient expressed understanding of the importance of proper follow up care.   Alford Highland Shalaina Guardiola M.D. Diseases & Surgery of the Retina and Vitreous [facility] 04/20/21     Abbreviations: M myopia (nearsighted); A astigmatism; H hyperopia (farsighted); P presbyopia; Mrx spectacle prescription;  CTL contact lenses; OD right eye; OS left eye; OU both eyes  XT exotropia; ET esotropia; PEK punctate epithelial keratitis; PEE punctate epithelial erosions; DES dry eye syndrome; MGD meibomian gland dysfunction; ATs artificial tears; PFAT's preservative free artificial tears; NSC nuclear sclerotic cataract; PSC posterior subcapsular cataract; ERM epi-retinal membrane; PVD posterior vitreous detachment; RD retinal detachment; DM diabetes mellitus; DR diabetic retinopathy; NPDR non-proliferative diabetic retinopathy; PDR proliferative diabetic retinopathy; CSME clinically significant macular edema; DME diabetic macular edema; dbh dot blot hemorrhages; CWS cotton wool spot; POAG primary open angle glaucoma; C/D cup-to-disc ratio; HVF humphrey visual field; GVF goldmann visual field; OCT optical coherence tomography; IOP intraocular pressure; BRVO Branch retinal vein occlusion; CRVO central retinal vein occlusion; CRAO central retinal artery occlusion; BRAO branch retinal artery occlusion; RT retinal tear; SB scleral buckle; PPV pars plana vitrectomy; VH Vitreous hemorrhage; PRP panretinal laser photocoagulation; IVK intravitreal kenalog; VMT vitreomacular traction; MH Macular hole;  NVD neovascularization of the disc; NVE neovascularization elsewhere; AREDS age related eye disease study; ARMD age related macular  degeneration; POAG primary open angle glaucoma; EBMD epithelial/anterior basement membrane dystrophy; ACIOL anterior chamber intraocular lens; IOL intraocular lens; PCIOL posterior chamber intraocular lens; Phaco/IOL phacoemulsification with intraocular lens placement; PRK photorefractive keratectomy; LASIK laser assisted in situ keratomileusis; HTN hypertension; DM diabetes mellitus; COPD chronic obstructive pulmonary disease

## 2021-04-20 NOTE — Assessment & Plan Note (Signed)
OD looks great 

## 2021-04-20 NOTE — Assessment & Plan Note (Signed)
The nature of age--related macular degeneration was discussed with the patient as well as the distinction between dry and wet types. Checking an Amsler Grid daily with advice to return immediately should a distortion develop, was given to the patient. The patient 's smoking status now and in the past was determined and advice based on the AREDS study was provided regarding the consumption of antioxidant supplements. AREDS 2 vitamin formulation was recommended. Consumption of dark leafy vegetables and fresh fruits of various colors was recommended. Treatment modalities for wet macular degeneration particularly the use of intravitreal injections of anti-blood vessel growth factors was discussed with the patient. Avastin, Lucentis, and Eylea are the available options. On occasion, therapy includes the use of photodynamic therapy and thermal laser. Stressed to the patient do not rub eyes.  Patient was advised to check Amsler Grid daily and return immediately if changes are noted. Instructions on using the grid were given to the patient. All patient questions were answered.  OU Minor early no high risk features optional use of oral vitamin supplementation

## 2021-04-22 ENCOUNTER — Encounter (HOSPITAL_BASED_OUTPATIENT_CLINIC_OR_DEPARTMENT_OTHER): Payer: Self-pay | Admitting: Surgery

## 2021-04-22 ENCOUNTER — Other Ambulatory Visit: Payer: Self-pay

## 2021-04-22 NOTE — Progress Notes (Signed)
Spoke w/ via phone for pre-op interview---patient Lab needs dos----    NA           Lab results------NA COVID test -----patient states asymptomatic no test needed Arrive at -------1015 04/27/21 NPO after MN NO Solid Food.  Clear liquids from MN until---09:15am Med rec completed Medications to take morning of surgery -----none; Hold vitamins and supplements and aspirin until post procedure. Diabetic medication -----NA Patient instructed to bring photo id and insurance card day of surgery Patient aware to have Driver (ride ) / caregiver    for 24 hours after surgery  Patient Special Instructions -----NA Pre-Op special Istructions -----NA Patient verbalized understanding of instructions that were given at this phone interview. Patient denies shortness of breath, chest pain, fever, cough at this phone interview.

## 2021-04-23 ENCOUNTER — Encounter (HOSPITAL_BASED_OUTPATIENT_CLINIC_OR_DEPARTMENT_OTHER): Payer: Self-pay | Admitting: Surgery

## 2021-04-23 DIAGNOSIS — K4090 Unilateral inguinal hernia, without obstruction or gangrene, not specified as recurrent: Secondary | ICD-10-CM

## 2021-04-23 NOTE — H&P (Signed)
PROVIDER: Katherina Wimer Myra Rude, MD  Chief Complaint: Follow-up (Left inguinal hernia)   History of Present Illness:  Patient returns for follow-up examination. He had been referred by Dr. Norlene Campbell in December 2021 for right inguinal hernia. At the time of evaluation, the patient had a small to moderate umbilical hernia containing incarcerated adipose tissue and no findings of left or right inguinal hernia on physical exam. Approximately 6 weeks ago the patient began to notice a bulge in the right groin. This is gradually increased in size. He is wearing a truss for comfort. He denies any signs or symptoms of intestinal obstruction. He denies any symptoms from the umbilical hernia. He presents today to discuss right inguinal hernia repair again.   Review of Systems: A complete review of systems was obtained from the patient. I have reviewed this information and discussed as appropriate with the patient. See HPI as well for other ROS.  Review of Systems  Constitutional: Negative.  HENT: Negative.  Eyes: Negative.  Respiratory: Negative.  Cardiovascular: Negative.  Gastrointestinal: Negative.  Bulge in right groin  Genitourinary: Negative.  Musculoskeletal: Negative.  Skin: Negative.  Neurological: Negative.  Endo/Heme/Allergies: Negative.  Psychiatric/Behavioral: Negative.    Medical History: Past Medical History:  Diagnosis Date   Arthritis   History of cancer   Patient Active Problem List  Diagnosis   Left inguinal hernia   Past Surgical History:  Procedure Laterality Date   ARTHROSCOPIC ROTATOR CUFF REPAIR Bilateral   Torn Tendon    No Known Allergies  Current Outpatient Medications on File Prior to Visit  Medication Sig Dispense Refill   atorvastatin (LIPITOR) 20 MG tablet Take 20 mg by mouth once daily   cinnamon bark 500 mg capsule Take by mouth   multivitamin capsule Take 1 capsule by mouth once daily   omega-3 fatty acids-fish oil 360-1,200 mg  Cap Take 1 capsule by mouth once daily   No current facility-administered medications on file prior to visit.   Family History  Family history unknown: Yes    Social History   Tobacco Use  Smoking Status Former Smoker  Smokeless Tobacco Never Used    Social History   Socioeconomic History   Marital status: Unknown  Tobacco Use   Smoking status: Former Smoker   Smokeless tobacco: Never Used  Building services engineer Use: Never used  Substance and Sexual Activity   Alcohol use: Yes  Alcohol/week: 5.0 standard drinks  Types: 5 Glasses of wine per week   Drug use: Never   Objective:   Vitals:  BP: 136/82  Pulse: 67  SpO2: 97%  Weight: 61.7 kg (136 lb)  Height: 172.7 cm (5\' 8" )   Body mass index is 20.68 kg/m.  Physical Exam   GENERAL APPEARANCE Development: normal Nutritional status: normal Gross deformities: none  SKIN Rash, lesions, ulcers: none Induration, erythema: none Nodules: none palpable  EYES Conjunctiva and lids: normal Pupils: equal and reactive Iris: normal bilaterally  EARS, NOSE, MOUTH, THROAT External ears: no lesion or deformity External nose: no lesion or deformity Hearing: grossly normal Due to Covid-19 pandemic, patient is wearing a mask.  NECK Symmetric: yes Trachea: midline Thyroid: no palpable nodules in the thyroid bed  CHEST Respiratory effort: normal Retraction or accessory muscle use: no Breath sounds: normal bilaterally Rales, rhonchi, wheeze: none  CARDIOVASCULAR Auscultation: regular rhythm, normal rate Murmurs: none Pulses: radial pulse 2+ palpable Lower extremity edema: none  ABDOMEN Distension: none Masses: none palpable Tenderness: none Hepatosplenomegaly:  not present Hernia: Small to moderate umbilical hernia containing adipose tissue. Nontender.  GENITOURINARY Penis: no lesions Scrotum: no masses Palpation in the left inguinal canal with cough and Valsalva shows no sign of hernia. On the right there  is an obvious visible bulge. On palpation there is a small to moderate right inguinal hernia which augments with cough and Valsalva. It is reducible. It is minimally tender.  MUSCULOSKELETAL Station and gait: normal Digits and nails: no clubbing or cyanosis Muscle strength: grossly normal all extremities Range of motion: grossly normal all extremities Deformity: none  LYMPHATIC Cervical: none palpable Supraclavicular: none palpable  PSYCHIATRIC Oriented to person, place, and time: yes Mood and affect: normal for situation Judgment and insight: appropriate for situation  Assessment and Plan:   Left inguinal hernia   Patient returns to my practice with a right inguinal hernia. This is small to moderate in size and is reducible. He is wearing a truss for comfort.  Again we discussed hernia repair using prosthetic mesh. We discussed doing this as an outpatient procedure. We discussed his postoperative recovery and restrictions on his activities. The patient understands and wishes to proceed with surgery later this fall.  The risks and benefits of the procedure have been discussed at length with the patient. The patient understands the proposed procedure, potential alternative treatments, and the course of recovery to be expected. All of the patient's questions have been answered at this time. The patient wishes to proceed with surgery.  Darnell Level, MD [facility] practice Office: [phone]

## 2021-04-27 ENCOUNTER — Other Ambulatory Visit: Payer: Self-pay

## 2021-04-27 ENCOUNTER — Ambulatory Visit (HOSPITAL_BASED_OUTPATIENT_CLINIC_OR_DEPARTMENT_OTHER)
Admission: RE | Admit: 2021-04-27 | Discharge: 2021-04-27 | Disposition: A | Discharge status: Home / Self Care | Payer: Medicare Other | Attending: Surgery | Admitting: Surgery

## 2021-04-27 ENCOUNTER — Ambulatory Visit (HOSPITAL_BASED_OUTPATIENT_CLINIC_OR_DEPARTMENT_OTHER): Payer: Medicare Other | Admitting: Anesthesiology

## 2021-04-27 ENCOUNTER — Encounter (HOSPITAL_BASED_OUTPATIENT_CLINIC_OR_DEPARTMENT_OTHER): Payer: Self-pay | Admitting: Surgery

## 2021-04-27 ENCOUNTER — Ambulatory Visit: Payer: Self-pay | Admitting: Surgery

## 2021-04-27 ENCOUNTER — Encounter (HOSPITAL_BASED_OUTPATIENT_CLINIC_OR_DEPARTMENT_OTHER)
Admission: RE | Disposition: A | Discharge status: Home / Self Care | Payer: Self-pay | Source: Home / Self Care | Attending: Surgery

## 2021-04-27 DIAGNOSIS — K420 Umbilical hernia with obstruction, without gangrene: Secondary | ICD-10-CM | POA: Diagnosis not present

## 2021-04-27 DIAGNOSIS — K4090 Unilateral inguinal hernia, without obstruction or gangrene, not specified as recurrent: Secondary | ICD-10-CM | POA: Insufficient documentation

## 2021-04-27 DIAGNOSIS — Z87891 Personal history of nicotine dependence: Secondary | ICD-10-CM | POA: Diagnosis not present

## 2021-04-27 DIAGNOSIS — K429 Umbilical hernia without obstruction or gangrene: Secondary | ICD-10-CM | POA: Diagnosis not present

## 2021-04-27 DIAGNOSIS — E119 Type 2 diabetes mellitus without complications: Secondary | ICD-10-CM | POA: Insufficient documentation

## 2021-04-27 HISTORY — PX: INGUINAL HERNIA REPAIR: SHX194

## 2021-04-27 SURGERY — REPAIR, HERNIA, INGUINAL, ADULT
Anesthesia: General | Site: Inguinal | Laterality: Right

## 2021-04-27 MED ORDER — 0.9 % SODIUM CHLORIDE (POUR BTL) OPTIME
TOPICAL | Status: DC | PRN
  Administered 2021-04-27: 500 mL

## 2021-04-27 MED ORDER — FENTANYL CITRATE (PF) 100 MCG/2ML IJ SOLN
INTRAMUSCULAR | Status: DC | PRN
Start: 2021-04-27 — End: 2021-04-27
  Administered 2021-04-27 (×4): 25 ug via INTRAVENOUS

## 2021-04-27 MED ORDER — PROPOFOL 10 MG/ML IV BOLUS
INTRAVENOUS | Status: AC
  Filled 2021-04-27: qty 20

## 2021-04-27 MED ORDER — LIDOCAINE HCL (PF) 2 % IJ SOLN
INTRAMUSCULAR | Status: AC
  Filled 2021-04-27: qty 5

## 2021-04-27 MED ORDER — OXYCODONE HCL 5 MG/5ML PO SOLN: 5.0000 mg | Freq: Once | ORAL | Status: DC | PRN

## 2021-04-27 MED ORDER — ONDANSETRON HCL 4 MG/2ML IJ SOLN
INTRAMUSCULAR | Status: DC | PRN
  Administered 2021-04-27 (×2): 4 mg via INTRAVENOUS

## 2021-04-27 MED ORDER — ONDANSETRON HCL 4 MG/2ML IJ SOLN: 4.0000 mg | Freq: Once | INTRAMUSCULAR | Status: DC | PRN

## 2021-04-27 MED ORDER — CEFAZOLIN SODIUM-DEXTROSE 2-4 GM/100ML-% IV SOLN
INTRAVENOUS | Status: AC
  Filled 2021-04-27: qty 100

## 2021-04-27 MED ORDER — LACTATED RINGERS IV SOLN: INTRAVENOUS | Status: DC

## 2021-04-27 MED ORDER — CEFAZOLIN SODIUM-DEXTROSE 2-4 GM/100ML-% IV SOLN
2.0000 g | INTRAVENOUS | Status: AC
  Administered 2021-04-27: 2 g via INTRAVENOUS

## 2021-04-27 MED ORDER — DEXAMETHASONE SODIUM PHOSPHATE 10 MG/ML IJ SOLN
INTRAMUSCULAR | Status: AC
  Filled 2021-04-27: qty 1

## 2021-04-27 MED ORDER — PROPOFOL 10 MG/ML IV BOLUS
INTRAVENOUS | Status: DC | PRN
Start: 2021-04-27 — End: 2021-04-27
  Administered 2021-04-27: 110 mg via INTRAVENOUS

## 2021-04-27 MED ORDER — ONDANSETRON HCL 4 MG/2ML IJ SOLN
INTRAMUSCULAR | Status: AC
  Filled 2021-04-27: qty 2

## 2021-04-27 MED ORDER — PHENYLEPHRINE 40 MCG/ML (10ML) SYRINGE FOR IV PUSH (FOR BLOOD PRESSURE SUPPORT)
PREFILLED_SYRINGE | INTRAVENOUS | Status: AC
  Filled 2021-04-27: qty 10

## 2021-04-27 MED ORDER — PHENYLEPHRINE HCL (PRESSORS) 10 MG/ML IV SOLN
INTRAVENOUS | Status: DC | PRN
Start: 2021-04-27 — End: 2021-04-27
  Administered 2021-04-27 (×5): 80 ug via INTRAVENOUS

## 2021-04-27 MED ORDER — ACETAMINOPHEN 500 MG PO TABS
ORAL_TABLET | ORAL | Status: AC
  Filled 2021-04-27: qty 2

## 2021-04-27 MED ORDER — LIDOCAINE HCL (CARDIAC) PF 100 MG/5ML IV SOSY
PREFILLED_SYRINGE | INTRAVENOUS | Status: DC | PRN
  Administered 2021-04-27: 50 mg via INTRAVENOUS

## 2021-04-27 MED ORDER — HYDROMORPHONE HCL 1 MG/ML IJ SOLN: 0.2500 mg | INTRAMUSCULAR | Status: DC | PRN

## 2021-04-27 MED ORDER — BUPIVACAINE HCL 0.5 % IJ SOLN
INTRAMUSCULAR | Status: DC | PRN
  Administered 2021-04-27: 20 mL

## 2021-04-27 MED ORDER — ACETAMINOPHEN 500 MG PO TABS
1000.0000 mg | ORAL_TABLET | Freq: Once | ORAL | Status: AC
  Administered 2021-04-27: 1000 mg via ORAL

## 2021-04-27 MED ORDER — BUPIVACAINE LIPOSOME 1.3 % IJ SUSP
INTRAMUSCULAR | Status: DC | PRN
  Administered 2021-04-27: 20 mL

## 2021-04-27 MED ORDER — ROCURONIUM BROMIDE 100 MG/10ML IV SOLN
INTRAVENOUS | Status: DC | PRN
  Administered 2021-04-27: 50 mg via INTRAVENOUS

## 2021-04-27 MED ORDER — AMISULPRIDE (ANTIEMETIC) 5 MG/2ML IV SOLN: 10.0000 mg | Freq: Once | INTRAVENOUS | Status: DC | PRN

## 2021-04-27 MED ORDER — BUPIVACAINE LIPOSOME 1.3 % IJ SUSP: 20.0000 mL | Freq: Once | INTRAMUSCULAR | Status: DC

## 2021-04-27 MED ORDER — OXYCODONE HCL 5 MG PO TABS: 5.0000 mg | ORAL_TABLET | Freq: Once | ORAL | Status: DC | PRN

## 2021-04-27 MED ORDER — TRAMADOL HCL 50 MG PO TABS: 50.0000 mg | ORAL_TABLET | Freq: Four times a day (QID) | ORAL | 0 refills | Status: DC | PRN

## 2021-04-27 MED ORDER — GLYCOPYRROLATE PF 0.2 MG/ML IJ SOSY
PREFILLED_SYRINGE | INTRAMUSCULAR | Status: AC
  Filled 2021-04-27: qty 1

## 2021-04-27 MED ORDER — SUGAMMADEX SODIUM 200 MG/2ML IV SOLN
INTRAVENOUS | Status: DC | PRN
  Administered 2021-04-27: 200 mg via INTRAVENOUS

## 2021-04-27 MED ORDER — EPHEDRINE SULFATE (PRESSORS) 50 MG/ML IJ SOLN
INTRAMUSCULAR | Status: DC | PRN
  Administered 2021-04-27 (×2): 10 mg via INTRAVENOUS

## 2021-04-27 MED ORDER — FENTANYL CITRATE (PF) 100 MCG/2ML IJ SOLN
INTRAMUSCULAR | Status: AC
  Filled 2021-04-27: qty 2

## 2021-04-27 MED ORDER — CHLORHEXIDINE GLUCONATE CLOTH 2 % EX PADS: 6.0000 | MEDICATED_PAD | Freq: Once | CUTANEOUS | Status: DC

## 2021-04-27 MED ORDER — GLYCOPYRROLATE 0.2 MG/ML IJ SOLN
INTRAMUSCULAR | Status: DC | PRN
  Administered 2021-04-27: .2 mg via INTRAVENOUS

## 2021-04-27 MED ORDER — ROCURONIUM BROMIDE 10 MG/ML (PF) SYRINGE
PREFILLED_SYRINGE | INTRAVENOUS | Status: AC
  Filled 2021-04-27: qty 10

## 2021-04-27 SURGICAL SUPPLY — 47 items
ADH SKN CLS APL DERMABOND .7 (GAUZE/BANDAGES/DRESSINGS) ×1
APL PRP STRL LF DISP 70% ISPRP (MISCELLANEOUS) ×1
BLADE CLIPPER SENSICLIP SURGIC (BLADE) ×2 IMPLANT
BLADE SURG 15 STRL LF DISP TIS (BLADE) ×1 IMPLANT
BLADE SURG 15 STRL SS (BLADE) ×2
CHLORAPREP W/TINT 26 (MISCELLANEOUS) ×2 IMPLANT
COVER BACK TABLE 60X90IN (DRAPES) ×2 IMPLANT
COVER MAYO STAND STRL (DRAPES) ×2 IMPLANT
DECANTER SPIKE VIAL GLASS SM (MISCELLANEOUS) ×2 IMPLANT
DERMABOND ADVANCED (GAUZE/BANDAGES/DRESSINGS) ×1
DERMABOND ADVANCED .7 DNX12 (GAUZE/BANDAGES/DRESSINGS) ×1 IMPLANT
DRAIN PENROSE 0.5X18 (DRAIN) ×1 IMPLANT
DRAPE LAPAROTOMY TRNSV 102X78 (DRAPES) ×2 IMPLANT
DRAPE UTILITY XL STRL (DRAPES) ×2 IMPLANT
ELECT REM PT RETURN 9FT ADLT (ELECTROSURGICAL) ×2
ELECTRODE REM PT RTRN 9FT ADLT (ELECTROSURGICAL) ×1 IMPLANT
GAUZE 4X4 16PLY ~~LOC~~+RFID DBL (SPONGE) ×2 IMPLANT
GLOVE SURG NEOP MICRO LF SZ6.5 (GLOVE) ×1 IMPLANT
GLOVE SURG ORTHO LTX SZ8 (GLOVE) ×2 IMPLANT
GLOVE SURG UNDER POLY LF SZ7 (GLOVE) ×3 IMPLANT
GLOVE SURG UNDER POLY LF SZ7.5 (GLOVE) ×1 IMPLANT
GOWN STRL REUS W/ TWL LRG LVL3 (GOWN DISPOSABLE) IMPLANT
GOWN STRL REUS W/TWL LRG LVL3 (GOWN DISPOSABLE) ×3 IMPLANT
GOWN STRL REUS W/TWL XL LVL3 (GOWN DISPOSABLE) ×2 IMPLANT
KIT TURNOVER CYSTO (KITS) ×2 IMPLANT
MANIFOLD NEPTUNE II (INSTRUMENTS) ×1 IMPLANT
MESH ULTRAPRO 3X6 7.6X15CM (Mesh General) ×2 IMPLANT
NDL HYPO 25X1 1.5 SAFETY (NEEDLE) ×1 IMPLANT
NEEDLE HYPO 25X1 1.5 SAFETY (NEEDLE) ×2 IMPLANT
NS IRRIG 500ML POUR BTL (IV SOLUTION) ×2 IMPLANT
PACK BASIN DAY SURGERY FS (CUSTOM PROCEDURE TRAY) ×2 IMPLANT
PENCIL SMOKE EVACUATOR (MISCELLANEOUS) ×2 IMPLANT
SPONGE T-LAP 18X18 ~~LOC~~+RFID (SPONGE) IMPLANT
SPONGE T-LAP 4X18 ~~LOC~~+RFID (SPONGE) IMPLANT
SUT MNCRL AB 4-0 PS2 18 (SUTURE) ×2 IMPLANT
SUT NOVA 0 T19/GS 22DT (SUTURE) IMPLANT
SUT NOVA NAB DX-16 0-1 5-0 T12 (SUTURE) IMPLANT
SUT NOVA NAB GS-22 2 0 T19 (SUTURE) ×4 IMPLANT
SUT SILK 2 0 SH (SUTURE) ×1 IMPLANT
SUT SILK 2 0 TIES 17X18 (SUTURE)
SUT SILK 2-0 18XBRD TIE BLK (SUTURE) IMPLANT
SUT VIC AB 3-0 SH 18 (SUTURE) ×2 IMPLANT
SYR BULB EAR ULCER 3OZ GRN STR (SYRINGE) ×2 IMPLANT
SYR CONTROL 10ML LL (SYRINGE) ×2 IMPLANT
TOWEL OR 17X26 10 PK STRL BLUE (TOWEL DISPOSABLE) ×2 IMPLANT
TUBE CONNECTING 12X1/4 (SUCTIONS) ×2 IMPLANT
YANKAUER SUCT BULB TIP NO VENT (SUCTIONS) ×2 IMPLANT

## 2021-04-27 NOTE — Anesthesia Postprocedure Evaluation (Signed)
Anesthesia Post Note  Patient: Jeremy Holmes  Procedure(s) Performed: OPEN REPAIR RIGHT INGUINAL HERNIA WITH MESH (Right: Inguinal)     Patient location during evaluation: PACU Anesthesia Type: General Level of consciousness: awake and alert, oriented and patient cooperative Pain management: pain level controlled Vital Signs Assessment: post-procedure vital signs reviewed and stable Respiratory status: spontaneous breathing, nonlabored ventilation and respiratory function stable Cardiovascular status: blood pressure returned to baseline and stable Postop Assessment: no apparent nausea or vomiting Anesthetic complications: no   No notable events documented.  Last Vitals:  Vitals:   04/27/21 1330 04/27/21 1424  BP:  (!) 152/64  Pulse: 63 63  Resp: 17 18  Temp:    SpO2: 97% 100%    Last Pain:  Vitals:   04/27/21 1424  TempSrc:   PainSc: 0-No pain                 Lannie Fields

## 2021-04-27 NOTE — Discharge Instructions (Addendum)
[facility]  HERNIA REPAIR POST OP INSTRUCTIONS  Always review your discharge instruction sheet given to you by the facility where your surgery was performed.  A  prescription for pain medication may be sent to your pharmacy on discharge.  Take your pain medication as prescribed.  If narcotic pain medicine is not needed, then you may take acetaminophen (Tylenol) or ibuprofen (Advil) as needed.  Take your usually prescribed medications unless otherwise directed.  If you need a refill on your pain medication, please contact your pharmacy.  They will contact our office to request authorization. Prescriptions will not be filled after 5:00 PM daily or on weekends.  You should follow a light diet the first 24 hours after arrival home, such as soup and crackers or toast.  Be sure to include plenty of fluids daily.  Resume your normal diet the day after surgery.  Most patients will experience some swelling and bruising around the surgical site.  Ice packs and reclining will help.  Swelling and bruising can take several days to resolve.   It is common to experience some constipation if taking pain medication after surgery.  Increasing fluid intake and taking a stool softener (such as Colace) will usually help or prevent this problem from occurring.  A mild laxative (Milk of Magnesia or Miralax) should be taken according to package directions if there is no bowel movement after 48 hours.  You will likely have Dermabond (topical glue) over your incisions.  This seals the incisions and allows you to bathe and shower at any time after your surgery.  Glue should remain in place for up to 10 days.  It may be removed after 10 days by pealing off the Dermabond material or using Vaseline or naval jelly to remove.  ACTIVITIES:  You may resume regular (light) daily activities beginning the next day - such as daily self-care, walking, climbing stairs - gradually increasing activities as tolerated.  You  may have sexual intercourse when it is comfortable.  Refrain from any heavy lifting or straining until approved by your doctor.  You may drive when you are no longer taking prescription pain medication, when you can comfortably wear a seatbelt, and when you can safely maneuver your car and apply the brakes.  You should see your doctor in the office for a follow-up appointment approximately 2-3 weeks after your surgery.  Make sure that you call for this appointment within a day or two after you arrive home to insure a convenient appointment time.  WHEN TO CALL YOUR DOCTOR: Fever greater than 101.0 Inability to urinate Persistent nausea and/or vomiting Extreme swelling or bruising Continued bleeding from incision Increased pain, redness, or drainage from the incision  The clinic staff is available to answer your questions during regular business hours.  Please dont hesitate to call and ask to speak to one of the nurses for clinical concerns.  If you have a medical emergency, go to the nearest emergency room or call 911.  A surgeon from [facility] is always on call for the hospital.   [facility] [address], [place], [place], [place]  [address]  [phone] ? [phone] ? FAX [phone]     No acetaminophen/Tylenol until after 4:30pm today if needed for pain.      Post Anesthesia Home Care Instructions  Activity: Get plenty of rest for the remainder of the day. A responsible individual must stay with you for 24 hours following the procedure.  For the next 24 hours, DO NOT: -Drive a car -Paediatric nurse -Drink alcoholic beverages -Take any medication unless instructed by your physician -Make any legal decisions or sign important papers.  Meals: Start with liquid foods such as gelatin or soup. Progress to regular foods as tolerated. Avoid greasy, spicy, heavy foods. If nausea and/or vomiting occur, drink only clear liquids until the  nausea and/or vomiting subsides. Call your physician if vomiting continues.  Special Instructions/Symptoms: Your throat may feel dry or sore from the anesthesia or the breathing tube placed in your throat during surgery. If this causes discomfort, gargle with warm salt water. The discomfort should disappear within 24 hours.   Information for Discharge Teaching: EXPAREL (bupivacaine liposome injectable suspension)   Your surgeon or anesthesiologist gave you EXPAREL(bupivacaine) to help control your pain after surgery.  EXPAREL is a local anesthetic that provides pain relief by numbing the tissue around the surgical site. EXPAREL is designed to release pain medication over time and can control pain for up to 72 hours. Depending on how you respond to EXPAREL, you may require less pain medication during your recovery.  Possible side effects: Temporary loss of sensation or ability to move in the area where bupivacaine was injected. Nausea, vomiting, constipation Rarely, numbness and tingling in your mouth or lips, lightheadedness, or anxiety may occur. Call your doctor right away if you think you may be experiencing any of these sensations, or if you have other questions regarding possible side effects.  Follow all other discharge instructions given to you by your surgeon or nurse. Eat a healthy diet and drink plenty of water or other fluids.  If you return to the hospital for any reason within 96 hours following the administration of EXPAREL, it is important for health care providers to know that you have received this anesthetic. A teal colored band has been placed on your arm with the date, time and amount of EXPAREL you have received in order to alert and inform your health care providers. Please leave this armband in place for the full 96 hours following administration, and then you may remove the band. Information for Discharge Teaching:    EXPAREL (bupivacaine liposome injectable  suspension)   Your surgeon or anesthesiologist gave you EXPAREL(bupivacaine) to help control your pain after surgery.  EXPAREL is a local anesthetic that provides pain relief by numbing the tissue around the surgical site. EXPAREL is designed to release pain medication over time and can control pain for up to 72 hours. Depending on how you respond to EXPAREL, you may require less pain medication during your recovery.  Possible side effects: Temporary loss of sensation or ability to move in the area where bupivacaine was injected. Nausea, vomiting, constipation Rarely, numbness and tingling in your mouth or lips, lightheadedness, or anxiety may occur. Call your doctor right away if you think you may be experiencing any of these sensations, or if you have other questions regarding possible side effects.  Follow all other discharge instructions given to you by your surgeon or nurse. Eat a healthy diet and drink plenty of water or other fluids.  If you return to the hospital for any reason within 96 hours following the administration of EXPAREL, it is important for health care providers to know that you have received this anesthetic. A teal colored band has been placed on your arm with the date, time and amount of EXPAREL you have received in order to alert and inform your health care providers. Please leave  this armband in place for the full 96 hours following administration, and then you may remove the band.  Band can be removed on Saturday May 01, 2021.

## 2021-04-27 NOTE — Interval H&P Note (Signed)
History and Physical Interval Note:  04/27/2021 10:42 AM  Jeremy Holmes  has presented today for surgery, with the diagnosis of RIGHT INGUINAL HERNIA, REDUCIBLE.  The various methods of treatment have been discussed with the patient and family. After consideration of risks, benefits and other options for treatment, the patient has consented to    Procedure(s): OPEN REPAIR RIGHT INGUINAL HERNIA WITH MESH (Right) as a surgical intervention.    The patient's history has been reviewed, patient examined, no change in status, stable for surgery.  I have reviewed the patient's chart and labs.  Questions were answered to the patient's satisfaction.    Darnell Level, MD [facility] practice Office: [phone]   Darnell Level

## 2021-04-27 NOTE — Anesthesia Preprocedure Evaluation (Addendum)
Anesthesia Evaluation  Patient identified by MRN, date of birth, ID band Patient awake    Reviewed: Allergy & Precautions, NPO status , Patient's Chart, lab work & pertinent test results  Airway Mallampati: I  TM Distance: >3 FB Neck ROM: Full    Dental  (+) Dental Advisory Given, Edentulous Upper   Pulmonary neg pulmonary ROS, former smoker,    Pulmonary exam normal breath sounds clear to auscultation       Cardiovascular negative cardio ROS Normal cardiovascular exam Rhythm:Regular Rate:Normal     Neuro/Psych PSYCHIATRIC DISORDERS Anxiety Depression negative neurological ROS     GI/Hepatic negative GI ROS, (+)       alcohol use,   Endo/Other  diabetes (pre-diabetic, diet controlled)  Renal/GU negative Renal ROS  negative genitourinary   Musculoskeletal  (+) Arthritis , Osteoarthritis,    Abdominal   Peds  Hematology negative hematology ROS (+)   Anesthesia Other Findings Right inguinal hernia   Reproductive/Obstetrics negative OB ROS                            Anesthesia Physical Anesthesia Plan  ASA: 1  Anesthesia Plan: General   Post-op Pain Management: Tylenol PO (pre-op)   Induction: Intravenous  PONV Risk Score and Plan: 3 and Ondansetron, Dexamethasone and Treatment may vary due to age or medical condition  Airway Management Planned: Oral ETT  Additional Equipment: None  Intra-op Plan:   Post-operative Plan: Extubation in OR  Informed Consent: I have reviewed the patients History and Physical, chart, labs and discussed the procedure including the risks, benefits and alternatives for the proposed anesthesia with the patient or authorized representative who has indicated his/her understanding and acceptance.     Dental advisory given  Plan Discussed with: CRNA  Anesthesia Plan Comments:        Anesthesia Quick Evaluation

## 2021-04-27 NOTE — Op Note (Signed)
Procedure Note  Pre-operative Diagnosis:  right inguinal hernia  Post-operative Diagnosis: same  Procedure:  Open right inguinal hernia repair with mesh  Surgeon:  Darnell Level, MD  Anesthesia:  General  Preparation:  Chlora-prep  Estimated Blood Loss: minimal  Complications:  none  Indications: The patient presented with a right, reducible hernia.  At the time of evaluation, the patient had a small to moderate umbilical hernia containing incarcerated adipose tissue and no findings of left or right inguinal hernia on physical exam. Approximately 6 weeks ago the patient began to notice a bulge in the right groin. This is gradually increased in size. He is wearing a truss for comfort. He denies any signs or symptoms of intestinal obstruction. He denies any symptoms from the umbilical hernia. He presents today to discuss right inguinal hernia repair again.   Procedure Details  The patient was evaluated in the holding area. All of the patient's questions were answered and the proposed procedure was confirmed. The site of the procedure was properly marked. The patient was taken to the Operating Room, identified by name, and the procedure verified as inguinal hernia repair.  The patient was placed in the supine position and underwent induction of anesthesia. A "Time Out" was performed per routine. The lower abdomen and groin were prepped and draped in the usual aseptic fashion.  After ascertaining that an adequate level of anesthesia had been obtained, an incision was made in the groin with a #10 blade.  Dissection was carried through the subcutaneous tissues and hemostasis obtained with the electrocautery.  A Gelpi retractor was placed for exposure.  The external oblique fascia was incised in line with it's fibers and extended through the external inguinal ring.  The cord structures were dissected out of the inguinal canal and encircled with a Penrose drain.  The floor of the inguinal canal was  dissected out.  There was no direct hernia defect.  The cord was explored and a small lipoma of the cord was excised.  A moderately large hernia sac was dissected out and a high ligation performed with a 2-0 Silk suture.  The sac was excised and discarded..  The floor of the inguinal canal was reconstructed with Ethicon Ultrapro mesh cut to the appropriate dimensions.  It was secured to the pubic tubercle with a 2-0 Novafil suture and along the inguinal ligament with a running 2-0 Novafil suture.  Mesh was split to accommodate the cord structures.  The superior margin of the mesh was secured to the transversalis and internal oblique musculature with interrupted 2-0 Novafil sutures.  The tails of the mesh were overlapped lateral to the cord structures and secured to the inguinal ligament with interrupted 2-0 Novafil sutures to recreate the internal inguinal ring.  Cord structures were returned to the inguinal canal.  Local anesthetic was infiltrated throughout the field.  External oblique fascia was closed with interrupted 3-0 Vicryl sutures.  Subcutaneous tissues were closed with interrupted 3-0 Vicryl sutures.  Skin was anesthetized with local anesthetic, and the skin edges were re-approximated with a running 4-0 Monocryl suture.  Wound was washed and dried and Dermabond was applied.  Instrument, sponge, and needle counts were correct prior to closure and at the conclusion of the case.  The patient tolerated the procedure well.  The patient was awakened from anesthesia and brought to the recovery room in stable condition.  Darnell Level, MD [facility] Office: [phone]

## 2021-04-27 NOTE — Anesthesia Procedure Notes (Signed)
Procedure Name: Intubation Date/Time: 04/27/2021 11:33 AM Performed by: Justice Rocher, CRNA Pre-anesthesia Checklist: Patient identified, Emergency Drugs available, Suction available, Patient being monitored and Timeout performed Patient Re-evaluated:Patient Re-evaluated prior to induction Oxygen Delivery Method: Circle system utilized Preoxygenation: Pre-oxygenation with 100% oxygen Induction Type: IV induction Ventilation: Mask ventilation without difficulty Laryngoscope Size: Mac and 3 Grade View: Grade II Tube type: Oral Tube size: 7.0 mm Number of attempts: 1 Airway Equipment and Method: Stylet and Oral airway Placement Confirmation: ETT inserted through vocal cords under direct vision, positive ETCO2, breath sounds checked- equal and bilateral and CO2 detector Secured at: 24 cm Tube secured with: Tape Dental Injury: Teeth and Oropharynx as per pre-operative assessment

## 2021-04-27 NOTE — Transfer of Care (Signed)
Immediate Anesthesia Transfer of Care Note  Patient: Jeremy Holmes  Procedure(s) Performed: Procedure(s) (LRB): OPEN REPAIR RIGHT INGUINAL HERNIA WITH MESH (Right)  Patient Location: PACU  Anesthesia Type: General  Level of Consciousness: awake, sedated, patient cooperative and responds to stimulation  Airway & Oxygen Therapy: Patient Spontanous Breathing and Patient connected to [place] 02 and soft FM   Post-op Assessment: Report given to PACU RN, Post -op Vital signs reviewed and stable and Patient moving all extremities  Post vital signs: Reviewed and stable  Complications: No apparent anesthesia complications

## 2021-04-28 ENCOUNTER — Encounter (HOSPITAL_BASED_OUTPATIENT_CLINIC_OR_DEPARTMENT_OTHER): Payer: Self-pay | Admitting: Surgery

## 2021-06-30 DIAGNOSIS — D649 Anemia, unspecified: Secondary | ICD-10-CM | POA: Diagnosis not present

## 2021-06-30 DIAGNOSIS — M25472 Effusion, left ankle: Secondary | ICD-10-CM | POA: Diagnosis not present

## 2021-07-14 DIAGNOSIS — D649 Anemia, unspecified: Secondary | ICD-10-CM | POA: Diagnosis not present

## 2021-07-30 DIAGNOSIS — D485 Neoplasm of uncertain behavior of skin: Secondary | ICD-10-CM | POA: Diagnosis not present

## 2021-07-30 DIAGNOSIS — L570 Actinic keratosis: Secondary | ICD-10-CM | POA: Diagnosis not present

## 2021-07-30 DIAGNOSIS — C4442 Squamous cell carcinoma of skin of scalp and neck: Secondary | ICD-10-CM | POA: Diagnosis not present

## 2021-08-16 DIAGNOSIS — D0439 Carcinoma in situ of skin of other parts of face: Secondary | ICD-10-CM | POA: Diagnosis not present

## 2021-08-20 DIAGNOSIS — D509 Iron deficiency anemia, unspecified: Secondary | ICD-10-CM | POA: Diagnosis not present

## 2021-08-30 DIAGNOSIS — Z48817 Encounter for surgical aftercare following surgery on the skin and subcutaneous tissue: Secondary | ICD-10-CM | POA: Diagnosis not present

## 2021-08-30 DIAGNOSIS — L905 Scar conditions and fibrosis of skin: Secondary | ICD-10-CM | POA: Diagnosis not present

## 2021-09-29 DIAGNOSIS — S0100XA Unspecified open wound of scalp, initial encounter: Secondary | ICD-10-CM | POA: Diagnosis not present

## 2021-10-25 DIAGNOSIS — D509 Iron deficiency anemia, unspecified: Secondary | ICD-10-CM | POA: Diagnosis not present

## 2021-11-08 DIAGNOSIS — S0100XA Unspecified open wound of scalp, initial encounter: Secondary | ICD-10-CM | POA: Diagnosis not present

## 2021-11-26 DIAGNOSIS — D509 Iron deficiency anemia, unspecified: Secondary | ICD-10-CM | POA: Diagnosis not present

## 2021-12-27 DIAGNOSIS — D509 Iron deficiency anemia, unspecified: Secondary | ICD-10-CM | POA: Diagnosis not present

## 2022-01-14 DIAGNOSIS — Z23 Encounter for immunization: Secondary | ICD-10-CM | POA: Diagnosis not present

## 2022-01-14 DIAGNOSIS — D509 Iron deficiency anemia, unspecified: Secondary | ICD-10-CM | POA: Diagnosis not present

## 2022-01-14 DIAGNOSIS — Z0000 Encounter for general adult medical examination without abnormal findings: Secondary | ICD-10-CM | POA: Diagnosis not present

## 2022-01-14 DIAGNOSIS — N400 Enlarged prostate without lower urinary tract symptoms: Secondary | ICD-10-CM | POA: Diagnosis not present

## 2022-01-14 DIAGNOSIS — R739 Hyperglycemia, unspecified: Secondary | ICD-10-CM | POA: Diagnosis not present

## 2022-01-14 DIAGNOSIS — E785 Hyperlipidemia, unspecified: Secondary | ICD-10-CM | POA: Diagnosis not present

## 2022-01-31 DIAGNOSIS — D509 Iron deficiency anemia, unspecified: Secondary | ICD-10-CM | POA: Diagnosis not present

## 2022-02-10 ENCOUNTER — Telehealth: Payer: Self-pay | Admitting: Hematology and Oncology

## 2022-02-10 NOTE — Telephone Encounter (Signed)
Spoke with patient confirming upcoming new patient appointments on 11/29

## 2022-02-23 ENCOUNTER — Inpatient Hospital Stay: Payer: Medicare Other

## 2022-02-23 ENCOUNTER — Other Ambulatory Visit: Payer: Self-pay

## 2022-02-23 ENCOUNTER — Inpatient Hospital Stay: Payer: Medicare Other | Attending: Hematology and Oncology | Admitting: Hematology and Oncology

## 2022-02-23 VITALS — BP 170/62 | HR 60 | Temp 97.7°F | Resp 16 | Wt 141.5 lb

## 2022-02-23 DIAGNOSIS — D509 Iron deficiency anemia, unspecified: Secondary | ICD-10-CM | POA: Diagnosis not present

## 2022-02-23 DIAGNOSIS — Z87891 Personal history of nicotine dependence: Secondary | ICD-10-CM

## 2022-02-23 DIAGNOSIS — D500 Iron deficiency anemia secondary to blood loss (chronic): Secondary | ICD-10-CM

## 2022-02-23 DIAGNOSIS — M1990 Unspecified osteoarthritis, unspecified site: Secondary | ICD-10-CM | POA: Diagnosis not present

## 2022-02-23 DIAGNOSIS — E785 Hyperlipidemia, unspecified: Secondary | ICD-10-CM | POA: Diagnosis not present

## 2022-02-23 LAB — CMP (CANCER CENTER ONLY)
ALT: 18 U/L (ref 0–44)
AST: 21 U/L (ref 15–41)
Albumin: 4.4 g/dL (ref 3.5–5.0)
Alkaline Phosphatase: 65 U/L (ref 38–126)
Anion gap: 9 (ref 5–15)
BUN: 19 mg/dL (ref 8–23)
CO2: 30 mmol/L (ref 22–32)
Calcium: 9.6 mg/dL (ref 8.9–10.3)
Chloride: 101 mmol/L (ref 98–111)
Creatinine: 0.78 mg/dL (ref 0.61–1.24)
GFR, Estimated: >60 mL/min (ref >60)
Glucose, Bld: 131 mg/dL — ABNORMAL HIGH (ref 70–99)
Potassium: 4 mmol/L (ref 3.5–5.1)
Sodium: 140 mmol/L (ref 135–145)
Total Bilirubin: 0.6 mg/dL (ref 0.3–1.2)
Total Protein: 8.2 g/dL — ABNORMAL HIGH (ref 6.5–8.1)

## 2022-02-23 LAB — CBC WITH DIFFERENTIAL (CANCER CENTER ONLY)
Abs Immature Granulocytes: 0.01 10*3/uL (ref 0.00–0.07)
Basophils Absolute: 0.1 10*3/uL (ref 0.0–0.1)
Basophils Relative: 1 %
Eosinophils Absolute: 0.3 10*3/uL (ref 0.0–0.5)
Eosinophils Relative: 3 %
HCT: 39.6 % (ref 39.0–52.0)
Hemoglobin: 13 g/dL (ref 13.0–17.0)
Immature Granulocytes: 0 %
Lymphocytes Relative: 19 %
Lymphs Abs: 1.8 10*3/uL (ref 0.7–4.0)
MCH: 31.3 pg (ref 26.0–34.0)
MCHC: 32.8 g/dL (ref 30.0–36.0)
MCV: 95.2 fL (ref 80.0–100.0)
Monocytes Absolute: 0.7 10*3/uL (ref 0.1–1.0)
Monocytes Relative: 7 %
Neutro Abs: 6.4 10*3/uL (ref 1.7–7.7)
Neutrophils Relative %: 70 %
Platelet Count: 322 10*3/uL (ref 150–400)
RBC: 4.16 MIL/uL — ABNORMAL LOW (ref 4.22–5.81)
RDW: 13.8 % (ref 11.5–15.5)
WBC Count: 9.2 10*3/uL (ref 4.0–10.5)
nRBC: 0 % (ref 0.0–0.2)

## 2022-02-23 LAB — FOLATE: Folate: >40 ng/mL (ref >5.9)

## 2022-02-23 LAB — RETIC PANEL
Immature Retic Fract: 7.4 % (ref 2.3–15.9)
RBC.: 4.07 MIL/uL — ABNORMAL LOW (ref 4.22–5.81)
Retic Count, Absolute: 29.7 10*3/uL (ref 19.0–186.0)
Retic Ct Pct: 0.7 % (ref 0.4–3.1)
Reticulocyte Hemoglobin: 36 pg (ref >27.9)

## 2022-02-23 LAB — IRON AND IRON BINDING CAPACITY (CC-WL,HP ONLY)
Iron: 48 ug/dL (ref 45–182)
Saturation Ratios: 16 % — ABNORMAL LOW (ref 17.9–39.5)
TIBC: 311 ug/dL (ref 250–450)
UIBC: 263 ug/dL

## 2022-02-23 LAB — VITAMIN B12: Vitamin B-12: 289 pg/mL (ref 180–914)

## 2022-02-23 LAB — FERRITIN: Ferritin: 66 ng/mL (ref 24–336)

## 2022-02-23 NOTE — Progress Notes (Signed)
[facility] [phone]   [phone]  INITIAL CONSULT NOTE  Patient Care Team: [name], MD as PCP - General (Family Medicine)  Hematological/Oncological History # Iron Deficiency Anemia of Unclear Etiology 01/14/2022: WBC 8.7, Hgb 12.3, MCV 94.8, Plt 334 02/23/2022: establish care with Dr. Leonides Schanz   CHIEF COMPLAINTS/PURPOSE OF CONSULTATION:  "Iron Deficiency Anemia "  HISTORY OF PRESENTING ILLNESS:  Jeremy Holmes 88 y.o. male with medical history significant for anxiety, arthritis, hyperlipidemia, and prediabetes who presents for evaluation of iron deficiency anemia.  On review of the previous records Mr. Down follows with Dr. Bosie Clos in gastroenterology.  He had a hemoglobin 11.1 in July, 12.0 in May, and 11.1 in June 2022.  He continually has low iron sats and serum iron levels.  He does take iron pills.  Last had a colonoscopy in January 2019 which showed diverticulosis and internal hemorrhoids.  An EGD had not yet been performed.  On 01/14/2022 patient had a labs drawn which showed white blood cell count 8.7, hemoglobin 12.3, MCV 94.8, and platelets of 334.  Due to concern for his iron deficiency anemia he was referred to hematology for further evaluation and management.  On exam today [name] notes that he initially presented with a sore ankle to his doctor and through routine blood workup sent to have a low white blood cell count.  He has been on iron pills for 6 months.  He reports that for 2 months it "helped a little bit".  But now he is feeling tired again.  He reports that he does follow with gastroenterology at [facility].  He notes that he has not had any overt signs of bleeding.  He denies any nosebleeds, gum bleeding, or bruising.  He reports he has dark stools with his iron pills.  He reports he eats red meat though not often.  He does enjoy eating liver.  He notes that he is otherwise at his baseline level of health other than his  fatigue.  On further discussion he reports he has no family history of blood diseases.  His mother had uterine cancer and his father had some form of cancer in 82.  He had a son with testicular cancer who is in remission.  He has a daughter with breast cancer.  He reports that he smoked but quit 40 years ago.  He does drink some occasional wine but none in the last year or 2.  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  He notes that he does not have a lot of motivation and he suffers from "laziness".  He thinks this is due to his low energy levels.  He otherwise denies any fevers, chills, sweats, nausea, vomiting or diarrhea.  A full 10 point ROS was otherwise negative.  MEDICAL HISTORY:  Past Medical History:  Diagnosis Date   Anxiety    no meds   Arthritis    right knee   Depression    no meds   HLD (hyperlipidemia)    Pre-diabetes    diet controlled - no meds    SURGICAL HISTORY: Past Surgical History:  Procedure Laterality Date   COLONOSCOPY     ELBOW SURGERY Right    EYE SURGERY Bilateral    cataracts surgery   INGUINAL HERNIA REPAIR Right 04/27/2021   Procedure: OPEN REPAIR RIGHT INGUINAL HERNIA WITH MESH;  Surgeon: Darnell Level, MD;  Location: [facility];  Service: General;  Laterality: Right;  KNEE ARTHROSCOPY Right 08/20/2020   Procedure: ARTHROSCOPY KNEE AND IRRIGATION AND DEBRIDEMENT;  Surgeon: Leandrew Koyanagi, MD;  Location: [place];  Service: Orthopedics;  Laterality: Right;   SHOULDER SURGERY Bilateral    x 2   WISDOM TOOTH EXTRACTION      SOCIAL HISTORY: Social History   Socioeconomic History   Marital status: Married    Spouse name: Not on file   Number of children: Not on file   Years of education: Not on file   Highest education level: Not on file  Occupational History   Not on file  Tobacco Use   Smoking status: Former    Types: Cigarettes   Smokeless tobacco: Never  Vaping Use   Vaping Use: Never used   Substance and Sexual Activity   Alcohol use: Yes    Alcohol/week: 7.0 standard drinks of alcohol    Types: 7 Glasses of wine per week    Comment: occ at night with supper   Drug use: No   Sexual activity: Not on file  Other Topics Concern   Not on file  Social History Narrative   Not on file   Social Determinants of Health   Financial Resource Strain: Not on file  Food Insecurity: Not on file  Transportation Needs: Not on file  Physical Activity: Not on file  Stress: Not on file  Social Connections: Not on file  Intimate Partner Violence: Not on file    FAMILY HISTORY: No family history on file.  ALLERGIES:  has No Known Allergies.  MEDICATIONS:  Current Outpatient Medications  Medication Sig Dispense Refill   ferrous sulfate 325 (65 FE) MG EC tablet Take 325 mg by mouth daily with breakfast.     acetaminophen (TYLENOL) 500 MG tablet Take 1,000 mg by mouth every 6 (six) hours as needed for moderate pain.     Ascorbic Acid (VITAMIN C WITH ROSE HIPS) 1000 MG tablet Take 1,000 mg by mouth daily.     aspirin 81 MG chewable tablet Chew 81 mg by mouth daily.     atorvastatin (LIPITOR) 20 MG tablet Take 20 mg by mouth daily.     CINNAMON PO Take 1,000 mg by mouth daily.     Multiple Vitamins-Minerals (CENTRUM SILVER PO) Take 1 tablet by mouth daily.     Omega-3 Fatty Acids (FISH OIL) 1200 MG CAPS Take 1 capsule by mouth daily.     No current facility-administered medications for this visit.    REVIEW OF SYSTEMS:   Constitutional: ( - ) fevers, ( - )  chills , ( - ) night sweats Eyes: ( - ) blurriness of vision, ( - ) double vision, ( - ) watery eyes Ears, nose, mouth, throat, and face: ( - ) mucositis, ( - ) sore throat Respiratory: ( - ) cough, ( - ) dyspnea, ( - ) wheezes Cardiovascular: ( - ) palpitation, ( - ) chest discomfort, ( - ) lower extremity swelling Gastrointestinal:  ( - ) nausea, ( - ) heartburn, ( - ) change in bowel habits Skin: ( - ) abnormal skin  rashes Lymphatics: ( - ) new lymphadenopathy, ( - ) easy bruising Neurological: ( - ) numbness, ( - ) tingling, ( - ) new weaknesses Behavioral/Psych: ( - ) mood change, ( - ) new changes  All other systems were reviewed with the patient and are negative.  PHYSICAL EXAMINATION:  Vitals:   02/23/22 1304  BP: (!) 170/62  Pulse: 60  Resp: 16  Temp: 97.7 F (36.5 C)   Filed Weights   02/23/22 1304  Weight: 141 lb 8 oz (64.2 kg)    GENERAL: well appearing elderly Caucasian male in NAD  SKIN: skin color, texture, turgor are normal, no rashes or significant lesions EYES: conjunctiva are pink and non-injected, sclera clear LUNGS: clear to auscultation and percussion with normal breathing effort HEART: regular rate & rhythm and no murmurs and no lower extremity edema Musculoskeletal: no cyanosis of digits and no clubbing  PSYCH: alert & oriented x 3, fluent speech NEURO: no focal motor/sensory deficits  LABORATORY DATA:  I have reviewed the data as listed    Latest Ref Rng & Units 02/23/2022    2:07 PM 08/20/2020   10:34 AM  CBC  WBC 4.0 - 10.5 K/uL 9.2  11.3   Hemoglobin 13.0 - 17.0 g/dL 13.0  13.6   Hematocrit 39.0 - 52.0 % 39.6  40.4   Platelets 150 - 400 K/uL 322  248        Latest Ref Rng & Units 02/23/2022    2:07 PM 08/20/2020   10:34 AM  CMP  Glucose 70 - 99 mg/dL 131  135   BUN 8 - 23 mg/dL 19  16   Creatinine 0.61 - 1.24 mg/dL 0.78  1.04   Sodium 135 - 145 mmol/L 140  138   Potassium 3.5 - 5.1 mmol/L 4.0  3.6   Chloride 98 - 111 mmol/L 101  104   CO2 22 - 32 mmol/L 30  26   Calcium 8.9 - 10.3 mg/dL 9.6  9.0   Total Protein 6.5 - 8.1 g/dL 8.2    Total Bilirubin 0.3 - 1.2 mg/dL 0.6    Alkaline Phos 38 - 126 U/L 65    AST 15 - 41 U/L 21    ALT 0 - 44 U/L 18       ASSESSMENT & PLAN BROGAN DEZEEUW 88 y.o. male with medical history significant for anxiety, arthritis, hyperlipidemia, and prediabetes who presents for evaluation of iron deficiency  anemia.  After review of the labs, review of the records, and discussion with the patient the patients findings are most consistent with iron deficiency anemia, likely secondary to GI bleeding.  At this time the patient does not have any overt signs or symptoms of bleeding.  He has been taking iron pills without much improvement in his hemoglobin or iron stores.  As such I would recommend pursuing EGD.  We will recheck his labs today.  If he is persistently anemic with iron deficiency we can pursue IV iron therapy and this would be considered p.o. iron therapy failure.  The patient voiced understanding of the plan moving forward.  # Iron Deficiency Anemia 2/2 Unclear Etiology, Suspect due to GI Bleeding -- Findings are consistent with iron deficiency anemia secondary GI bleeding.  --Encouraged her to follow-up with GI for further evaluation.  He currently follows with [facility]. --We will confirm iron deficiency anemia by ordering iron panel and ferritin as well as reticulocytes, CBC, and CMP --Continue ferrous sulfate 325 mg daily with a source of vitamin C --We will plan to proceed with IV iron therapy in order to help bolster the patient's blood counts if his hemoglobin levels are persistently low with low iron. --Plan for return to clinic in 3 months or sooner if IV iron is required.    Orders Placed This Encounter  Procedures   CBC with Differential ([facility] Only)  Standing Status:   Future    Number of Occurrences:   1    Standing Expiration Date:   02/24/2023   CMP ([facility] only)    Standing Status:   Future    Number of Occurrences:   1    Standing Expiration Date:   02/24/2023   Ferritin    Standing Status:   Future    Number of Occurrences:   1    Standing Expiration Date:   02/24/2023   Iron and Iron Binding Capacity ([facility] only)    Standing Status:   Future    Number of Occurrences:   1    Standing Expiration Date:   02/24/2023   Retic Panel    Standing  Status:   Future    Number of Occurrences:   1    Standing Expiration Date:   02/24/2023   Vitamin B12    Standing Status:   Future    Number of Occurrences:   1    Standing Expiration Date:   02/23/2023   Methylmalonic acid, serum    Standing Status:   Future    Number of Occurrences:   1    Standing Expiration Date:   02/23/2023   Folate, Serum    Standing Status:   Future    Number of Occurrences:   1    Standing Expiration Date:   02/23/2023    All questions were answered. The patient knows to call the clinic with any problems, questions or concerns.  A total of more than 60 minutes were spent on this encounter with face-to-face time and non-face-to-face time, including preparing to see the patient, ordering tests and/or medications, counseling the patient and coordination of care as outlined above.   Ledell Peoples, MD Department of Hematology/Oncology [facility] Phone: [phone] Pager: [phone] Email: [email or web address]  02/23/2022 6:51 PM

## 2022-02-24 ENCOUNTER — Telehealth: Payer: Self-pay | Admitting: Hematology and Oncology

## 2022-02-24 NOTE — Telephone Encounter (Signed)
Per 11/29 los called and left message for pt about appointment

## 2022-02-25 LAB — METHYLMALONIC ACID, SERUM: Methylmalonic Acid, Quantitative: 204 nmol/L (ref 0–378)

## 2022-04-14 ENCOUNTER — Ambulatory Visit (INDEPENDENT_AMBULATORY_CARE_PROVIDER_SITE_OTHER): Payer: Medicare Other

## 2022-04-14 ENCOUNTER — Encounter: Payer: Self-pay | Admitting: Orthopaedic Surgery

## 2022-04-14 ENCOUNTER — Ambulatory Visit: Payer: Medicare Other | Admitting: Orthopaedic Surgery

## 2022-04-14 DIAGNOSIS — M25572 Pain in left ankle and joints of left foot: Secondary | ICD-10-CM

## 2022-04-14 MED ORDER — LIDOCAINE HCL 1 % IJ SOLN
1.0000 mL | INTRAMUSCULAR | Status: AC | PRN
  Administered 2022-04-14: 1 mL

## 2022-04-14 MED ORDER — METHYLPREDNISOLONE ACETATE 40 MG/ML IJ SUSP
40.0000 mg | INTRAMUSCULAR | Status: AC | PRN
  Administered 2022-04-14: 40 mg via INTRA_ARTICULAR

## 2022-04-14 MED ORDER — BUPIVACAINE HCL 0.5 % IJ SOLN
1.0000 mL | INTRAMUSCULAR | Status: AC | PRN
  Administered 2022-04-14: 1 mL via INTRA_ARTICULAR

## 2022-04-14 NOTE — Progress Notes (Signed)
Office Visit Note   Patient: Jeremy Holmes           Date of Birth: [birth year]           MRN: [id number] Visit Date: 04/14/2022              Requested by: Aura Dials, [place],  [place] [address] PCP: Aura Dials, MD   Assessment & Plan: Visit Diagnoses:  1. Pain in left ankle and joints of left foot     Plan: Impression is severe left ankle osteoarthritis with bone-on-bone changes and valgus tilt of the talus.  Findings reviewed with the patient treatment options were discussed.  We will try cortisone injection and immobilization with ASO brace during activity.  Questions encouraged and answered.  Follow-up as needed.  Follow-Up Instructions: No follow-ups on file.   Orders:  Orders Placed This Encounter  Procedures   XR Ankle Complete Left   No orders of the defined types were placed in this encounter.     Procedures: Medium Joint Inj: L ankle on 04/14/2022 11:14 AM Indications: pain Details: 25 G needle Medications: 1 mL lidocaine 1 %; 40 mg methylPREDNISolone acetate 40 MG/ML; 1 mL bupivacaine 0.5 % Outcome: tolerated well, no immediate complications Patient was prepped and draped in the usual sterile fashion.       Clinical Data: No additional findings.   Subjective: Chief Complaint  Patient presents with   Left Ankle - Pain    HPI Jeremy Holmes is a very pleasant 89 year old gentleman here for evaluation of left ankle pain for 6 months.  Denies any injuries.  Feels pain anteriorly and laterally with associated swelling.  Denies history of gout. Review of Systems  Constitutional: Negative.   HENT: Negative.    Eyes: Negative.   Respiratory: Negative.    Cardiovascular: Negative.   Gastrointestinal: Negative.   Endocrine: Negative.   Genitourinary: Negative.   Skin: Negative.   Allergic/Immunologic: Negative.   Neurological: Negative.   Hematological: Negative.   Psychiatric/Behavioral: Negative.    All other systems reviewed and  are negative.    Objective: Vital Signs: There were no vitals taken for this visit.  Physical Exam Vitals and nursing note reviewed.  Constitutional:      Appearance: He is well-developed.  HENT:     Head: Normocephalic and atraumatic.  Eyes:     Pupils: Pupils are equal, round, and reactive to light.  Pulmonary:     Effort: Pulmonary effort is normal.  Abdominal:     Palpations: Abdomen is soft.  Musculoskeletal:        General: Normal range of motion.     Cervical back: Neck supple.  Skin:    General: Skin is warm.  Neurological:     Mental Status: He is alert and oriented to person, place, and time.  Psychiatric:        Behavior: Behavior normal.        Thought Content: Thought content normal.        Judgment: Judgment normal.     Ortho Exam Examination of left ankle shows a hindfoot valgus.  He has pain to palpation in the gutters.  Pain in the ankle joint with range of motion. Specialty Comments:  No specialty comments available.  Imaging: XR Ankle Complete Left  Result Date: 04/14/2022 Advanced tibiotalar osteoarthritis and degenerative changes with valgus tilt of the talus.  Bone-on-bone joint space narrowing.    PMFS History: Patient Active Problem List  Diagnosis Date Noted   Right inguinal hernia 04/23/2021   Early stage nonexudative age-related macular degeneration of both eyes 04/20/2021   History of vitrectomy 04/20/2021   Pyogenic arthritis of right knee joint ([place]) 09/19/2020   Medication monitoring encounter 09/02/2020   Septic arthritis of knee, right ([place]) 08/19/2020   Pes planus of left foot 03/12/2020   Unilateral primary osteoarthritis, right knee 10/15/2019   Baker cyst, right 10/15/2019   Generalized anxiety disorder 06/07/2014   Adjustment disorder with depressed mood 06/07/2014   Past Medical History:  Diagnosis Date   Anxiety    no meds   Arthritis    right knee   Depression    no meds   HLD (hyperlipidemia)     Pre-diabetes    diet controlled - no meds    No family history on file.  Past Surgical History:  Procedure Laterality Date   COLONOSCOPY     ELBOW SURGERY Right    EYE SURGERY Bilateral    cataracts surgery   INGUINAL HERNIA REPAIR Right 04/27/2021   Procedure: OPEN REPAIR RIGHT INGUINAL HERNIA WITH MESH;  Surgeon: Armandina Gemma, MD;  Location: [facility];  Service: General;  Laterality: Right;   KNEE ARTHROSCOPY Right 08/20/2020   Procedure: ARTHROSCOPY KNEE AND IRRIGATION AND DEBRIDEMENT;  Surgeon: Leandrew Koyanagi, MD;  Location: [facility];  Service: Orthopedics;  Laterality: Right;   SHOULDER SURGERY Bilateral    x 2   WISDOM TOOTH EXTRACTION     Social History   Occupational History   Not on file  Tobacco Use   Smoking status: Former    Types: Cigarettes   Smokeless tobacco: Never  Vaping Use   Vaping Use: Never used  Substance and Sexual Activity   Alcohol use: Yes    Alcohol/week: 7.0 standard drinks of alcohol    Types: 7 Glasses of wine per week    Comment: occ at night with supper   Drug use: No   Sexual activity: Not on file

## 2022-05-25 ENCOUNTER — Other Ambulatory Visit: Payer: Self-pay

## 2022-05-25 ENCOUNTER — Inpatient Hospital Stay: Payer: Medicare Other | Attending: Hematology and Oncology

## 2022-05-25 ENCOUNTER — Other Ambulatory Visit: Payer: Self-pay | Admitting: Hematology and Oncology

## 2022-05-25 ENCOUNTER — Inpatient Hospital Stay (HOSPITAL_BASED_OUTPATIENT_CLINIC_OR_DEPARTMENT_OTHER): Payer: Medicare Other | Admitting: Hematology and Oncology

## 2022-05-25 VITALS — BP 161/59 | HR 61 | Temp 97.7°F | Resp 13 | Wt 145.5 lb

## 2022-05-25 DIAGNOSIS — Z87891 Personal history of nicotine dependence: Secondary | ICD-10-CM | POA: Diagnosis not present

## 2022-05-25 DIAGNOSIS — D500 Iron deficiency anemia secondary to blood loss (chronic): Secondary | ICD-10-CM

## 2022-05-25 DIAGNOSIS — D509 Iron deficiency anemia, unspecified: Secondary | ICD-10-CM | POA: Insufficient documentation

## 2022-05-25 LAB — CBC WITH DIFFERENTIAL (CANCER CENTER ONLY)
Abs Immature Granulocytes: 0.02 10*3/uL (ref 0.00–0.07)
Basophils Absolute: 0.1 10*3/uL (ref 0.0–0.1)
Basophils Relative: 1 %
Eosinophils Absolute: 0.8 10*3/uL — ABNORMAL HIGH (ref 0.0–0.5)
Eosinophils Relative: 10 %
HCT: 36.4 % — ABNORMAL LOW (ref 39.0–52.0)
Hemoglobin: 12.3 g/dL — ABNORMAL LOW (ref 13.0–17.0)
Immature Granulocytes: 0 %
Lymphocytes Relative: 18 %
Lymphs Abs: 1.4 10*3/uL (ref 0.7–4.0)
MCH: 32.6 pg (ref 26.0–34.0)
MCHC: 33.8 g/dL (ref 30.0–36.0)
MCV: 96.6 fL (ref 80.0–100.0)
Monocytes Absolute: 0.6 10*3/uL (ref 0.1–1.0)
Monocytes Relative: 8 %
Neutro Abs: 5 10*3/uL (ref 1.7–7.7)
Neutrophils Relative %: 63 %
Platelet Count: 265 10*3/uL (ref 150–400)
RBC: 3.77 MIL/uL — ABNORMAL LOW (ref 4.22–5.81)
RDW: 12.8 % (ref 11.5–15.5)
WBC Count: 7.8 10*3/uL (ref 4.0–10.5)
nRBC: 0 % (ref 0.0–0.2)

## 2022-05-25 LAB — RETIC PANEL
Immature Retic Fract: 6.9 % (ref 2.3–15.9)
RBC.: 3.73 MIL/uL — ABNORMAL LOW (ref 4.22–5.81)
Retic Count, Absolute: 28.3 10*3/uL (ref 19.0–186.0)
Retic Ct Pct: 0.8 % (ref 0.4–3.1)
Reticulocyte Hemoglobin: 35.9 pg (ref >27.9)

## 2022-05-25 LAB — CMP (CANCER CENTER ONLY)
ALT: 14 U/L (ref 0–44)
AST: 16 U/L (ref 15–41)
Albumin: 4.2 g/dL (ref 3.5–5.0)
Alkaline Phosphatase: 61 U/L (ref 38–126)
Anion gap: 6 (ref 5–15)
BUN: 17 mg/dL (ref 8–23)
CO2: 30 mmol/L (ref 22–32)
Calcium: 8.8 mg/dL — ABNORMAL LOW (ref 8.9–10.3)
Chloride: 102 mmol/L (ref 98–111)
Creatinine: 0.84 mg/dL (ref 0.61–1.24)
GFR, Estimated: >60 mL/min (ref >60)
Glucose, Bld: 148 mg/dL — ABNORMAL HIGH (ref 70–99)
Potassium: 4.2 mmol/L (ref 3.5–5.1)
Sodium: 138 mmol/L (ref 135–145)
Total Bilirubin: 0.4 mg/dL (ref 0.3–1.2)
Total Protein: 6.5 g/dL (ref 6.5–8.1)

## 2022-05-25 LAB — IRON AND IRON BINDING CAPACITY (CC-WL,HP ONLY)
Iron: 78 ug/dL (ref 45–182)
Saturation Ratios: 26 % (ref 17.9–39.5)
TIBC: 297 ug/dL (ref 250–450)
UIBC: 219 ug/dL (ref 117–376)

## 2022-05-25 LAB — FERRITIN: Ferritin: 41 ng/mL (ref 24–336)

## 2022-05-25 NOTE — Progress Notes (Signed)
[place] [phone]) [phone]   [phone]  PROGRESS NOTE  Patient Care Team: Aura Dials, MD as PCP - General (Family Medicine)  Hematological/Oncological History # Iron Deficiency Anemia of Unclear Etiology 01/14/2022: WBC 8.7, Hgb 12.3, MCV 94.8, Plt 334 02/23/2022: establish care with Dr. Lorenso Courier   Interval History:  Jeremy Holmes 89 y.o. male with medical history significant for iron deficiency anemia who presents for a follow up visit. The patient's last visit was on 11/29/20223 at which time he established care. In the interim since the last visit he has had no major changes in his health.  On exam today Jeremy Holmes reports that his energy levels are quite good.  He reports that he has not been having any overt signs of bleeding though his bowel movements are "dark brown".  He notes he is not having any black, tarry, or sticky bowel movements.  He is not having any bright red blood.  He notes he is not having any trouble with lightheadedness, dizziness, or shortness of breath.  He reports that he "feels like he is at baseline".  He notes he does have some occasional bouts of anxiety.  His weight has been steady.  He reports his energy levels are relatively good.  He otherwise denies any fevers, chills, sweats, nausea, vomiting or diarrhea.  A full 10 point ROS otherwise negative.  MEDICAL HISTORY:  Past Medical History:  Diagnosis Date   Anxiety    no meds   Arthritis    right knee   Depression    no meds   HLD (hyperlipidemia)    Pre-diabetes    diet controlled - no meds    SURGICAL HISTORY: Past Surgical History:  Procedure Laterality Date   COLONOSCOPY     ELBOW SURGERY Right    EYE SURGERY Bilateral    cataracts surgery   INGUINAL HERNIA REPAIR Right 04/27/2021   Procedure: OPEN REPAIR RIGHT INGUINAL HERNIA WITH MESH;  Surgeon: Armandina Gemma, MD;  Location: [facility];  Service: General;  Laterality: Right;   KNEE  ARTHROSCOPY Right 08/20/2020   Procedure: ARTHROSCOPY KNEE AND IRRIGATION AND DEBRIDEMENT;  Surgeon: Leandrew Koyanagi, MD;  Location: [facility];  Service: Orthopedics;  Laterality: Right;   SHOULDER SURGERY Bilateral    x 2   WISDOM TOOTH EXTRACTION      SOCIAL HISTORY: Social History   Socioeconomic History   Marital status: Married    Spouse name: Not on file   Number of children: Not on file   Years of education: Not on file   Highest education level: Not on file  Occupational History   Not on file  Tobacco Use   Smoking status: Former    Types: Cigarettes   Smokeless tobacco: Never  Vaping Use   Vaping Use: Never used  Substance and Sexual Activity   Alcohol use: Yes    Alcohol/week: 7.0 standard drinks of alcohol    Types: 7 Glasses of wine per week    Comment: occ at night with supper   Drug use: No   Sexual activity: Not on file  Other Topics Concern   Not on file  Social History Narrative   Not on file   Social Determinants of Health   Financial Resource Strain: Not on file  Food Insecurity: Not on file  Transportation Needs: Not on file  Physical Activity: Not on file  Stress: Not on file  Social Connections: Not on file  Intimate Partner  Violence: Not on file    FAMILY HISTORY: No family history on file.  ALLERGIES:  has No Known Allergies.  MEDICATIONS:  Current Outpatient Medications  Medication Sig Dispense Refill   acetaminophen (TYLENOL) 500 MG tablet Take 1,000 mg by mouth every 6 (six) hours as needed for moderate pain.     Ascorbic Acid (VITAMIN C WITH ROSE HIPS) 1000 MG tablet Take 1,000 mg by mouth daily.     aspirin 81 MG chewable tablet Chew 81 mg by mouth daily.     atorvastatin (LIPITOR) 20 MG tablet Take 20 mg by mouth daily.     CINNAMON PO Take 1,000 mg by mouth daily.     ferrous sulfate 325 (65 FE) MG EC tablet Take 325 mg by mouth daily with breakfast.     Multiple Vitamins-Minerals (CENTRUM SILVER PO) Take 1 tablet by mouth daily.      Omega-3 Fatty Acids (FISH OIL) 1200 MG CAPS Take 1 capsule by mouth daily.     No current facility-administered medications for this visit.    REVIEW OF SYSTEMS:   Constitutional: ( - ) fevers, ( - )  chills , ( - ) night sweats Eyes: ( - ) blurriness of vision, ( - ) double vision, ( - ) watery eyes Ears, nose, mouth, throat, and face: ( - ) mucositis, ( - ) sore throat Respiratory: ( - ) cough, ( - ) dyspnea, ( - ) wheezes Cardiovascular: ( - ) palpitation, ( - ) chest discomfort, ( - ) lower extremity swelling Gastrointestinal:  ( - ) nausea, ( - ) heartburn, ( - ) change in bowel habits Skin: ( - ) abnormal skin rashes Lymphatics: ( - ) new lymphadenopathy, ( - ) easy bruising Neurological: ( - ) numbness, ( - ) tingling, ( - ) new weaknesses Behavioral/Psych: ( - ) mood change, ( - ) new changes  All other systems were reviewed with the patient and are negative.  PHYSICAL EXAMINATION:  Vitals:   05/25/22 1036  BP: (!) 161/59  Pulse: 61  Resp: 13  Temp: 97.7 F (36.5 C)  SpO2: 99%   Filed Weights   05/25/22 1036  Weight: 145 lb 8 oz (66 kg)    GENERAL: Appearing elderly Caucasian male, alert, no distress and comfortable SKIN: skin color, texture, turgor are normal, no rashes or significant lesions EYES: conjunctiva are pink and non-injected, sclera clear LUNGS: clear to auscultation and percussion with normal breathing effort HEART: regular rate & rhythm and no murmurs and no lower extremity edema Musculoskeletal: no cyanosis of digits and no clubbing  PSYCH: alert & oriented x 3, fluent speech NEURO: no focal motor/sensory deficits  LABORATORY DATA:  I have reviewed the data as listed    Latest Ref Rng & Units 05/25/2022    9:48 AM 02/23/2022    2:07 PM 08/20/2020   10:34 AM  CBC  WBC 4.0 - 10.5 K/uL 7.8  9.2  11.3   Hemoglobin 13.0 - Holmes.0 g/dL 12.3  13.0  13.6   Hematocrit 39.0 - 52.0 % 36.4  39.6  40.4   Platelets 150 - 400 K/uL 265  322  248         Latest Ref Rng & Units 05/25/2022    9:48 AM 02/23/2022    2:07 PM 08/20/2020   10:34 AM  CMP  Glucose 70 - 99 mg/dL 148  131  135   BUN 8 - 23 mg/dL Holmes  19  16  Creatinine 0.61 - 1.24 mg/dL 0.84  0.78  1.04   Sodium 135 - 145 mmol/L 138  140  138   Potassium 3.5 - 5.1 mmol/L 4.2  4.0  3.6   Chloride 98 - 111 mmol/L 102  101  104   CO2 22 - 32 mmol/L '30  30  26   '$ Calcium 8.9 - 10.3 mg/dL 8.8  9.6  9.0   Total Protein 6.5 - 8.1 g/dL 6.5  8.2    Total Bilirubin 0.3 - 1.2 mg/dL 0.4  0.6    Alkaline Phos 38 - 126 U/L 61  65    AST 15 - 41 U/L 16  21    ALT 0 - 44 U/L 14  18      RADIOGRAPHIC STUDIES: No results found.  ASSESSMENT & PLAN IBRAHEEM FERRALES 89 y.o. male with medical history significant for iron deficiency anemia who presents for a follow up visit.   After review of the labs, review of the records, and discussion with the patient the patients findings are most consistent with iron deficiency anemia, likely secondary to GI bleeding.  At this time the patient does not have any overt signs or symptoms of bleeding.  He has been taking iron pills without much improvement in his hemoglobin or iron stores.  As such I would recommend pursuing EGD.  We will recheck his labs today.  If he is persistently anemic with iron deficiency we can pursue IV iron therapy and this would be considered p.o. iron therapy failure.  The patient voiced understanding of the plan moving forward.   # Iron Deficiency Anemia 2/2 Unclear Etiology, Suspect due to GI Bleeding -- Findings are consistent with iron deficiency anemia secondary GI bleeding.  --Encouraged her to follow-up with GI for further evaluation.  He currently follows with [facility].  Patient notes he does not wish to undergo endoscopic evaluation. --Continue ferrous sulfate 325 mg daily with a source of vitamin C --if his labs show consistently low iron we will plan to proceed with IV iron therapy in order to help bolster the patient's blood  counts  --Labs today show white blood cell count 7.8, hemoglobin 12.3, MCV 96.6, and platelets of 265 --Plan for return to clinic in 3 months or sooner if IV iron is required.   No orders of the defined types were placed in this encounter.   All questions were answered. The patient knows to call the clinic with any problems, questions or concerns.  A total of more than 30 minutes were spent on this encounter with face-to-face time and non-face-to-face time, including preparing to see the patient, ordering tests and/or medications, counseling the patient and coordination of care as outlined above.   Ledell Peoples, MD Department of Hematology/Oncology [place] [facility] Phone: [phone] Pager: [phone] Email: [email or web address]  05/29/2022 7:46 PM

## 2022-06-21 ENCOUNTER — Encounter (INDEPENDENT_AMBULATORY_CARE_PROVIDER_SITE_OTHER): Payer: Self-pay | Admitting: Ophthalmology

## 2022-07-14 DIAGNOSIS — Z9889 Other specified postprocedural states: Secondary | ICD-10-CM | POA: Diagnosis not present

## 2022-07-14 DIAGNOSIS — H353131 Nonexudative age-related macular degeneration, bilateral, early dry stage: Secondary | ICD-10-CM | POA: Diagnosis not present

## 2022-08-03 DIAGNOSIS — C44629 Squamous cell carcinoma of skin of left upper limb, including shoulder: Secondary | ICD-10-CM | POA: Diagnosis not present

## 2022-08-03 DIAGNOSIS — D485 Neoplasm of uncertain behavior of skin: Secondary | ICD-10-CM | POA: Diagnosis not present

## 2022-08-03 DIAGNOSIS — C44622 Squamous cell carcinoma of skin of right upper limb, including shoulder: Secondary | ICD-10-CM | POA: Diagnosis not present

## 2022-08-03 DIAGNOSIS — L570 Actinic keratosis: Secondary | ICD-10-CM | POA: Diagnosis not present

## 2022-08-16 DIAGNOSIS — C4492 Squamous cell carcinoma of skin, unspecified: Secondary | ICD-10-CM | POA: Diagnosis not present

## 2022-08-16 DIAGNOSIS — D0461 Carcinoma in situ of skin of right upper limb, including shoulder: Secondary | ICD-10-CM | POA: Diagnosis not present

## 2022-08-16 DIAGNOSIS — C44622 Squamous cell carcinoma of skin of right upper limb, including shoulder: Secondary | ICD-10-CM | POA: Diagnosis not present

## 2022-08-18 ENCOUNTER — Other Ambulatory Visit: Payer: Self-pay | Admitting: Hematology and Oncology

## 2022-08-18 ENCOUNTER — Inpatient Hospital Stay: Payer: Medicare Other | Attending: Hematology and Oncology

## 2022-08-18 DIAGNOSIS — Z87891 Personal history of nicotine dependence: Secondary | ICD-10-CM | POA: Diagnosis not present

## 2022-08-18 DIAGNOSIS — D509 Iron deficiency anemia, unspecified: Secondary | ICD-10-CM | POA: Insufficient documentation

## 2022-08-18 DIAGNOSIS — D500 Iron deficiency anemia secondary to blood loss (chronic): Secondary | ICD-10-CM

## 2022-08-18 DIAGNOSIS — K922 Gastrointestinal hemorrhage, unspecified: Secondary | ICD-10-CM | POA: Insufficient documentation

## 2022-08-18 LAB — FERRITIN: Ferritin: 36 ng/mL (ref 24–336)

## 2022-08-18 LAB — CMP (CANCER CENTER ONLY)
ALT: 12 U/L (ref 0–44)
AST: 15 U/L (ref 15–41)
Albumin: 4.3 g/dL (ref 3.5–5.0)
Alkaline Phosphatase: 56 U/L (ref 38–126)
Anion gap: 5 (ref 5–15)
BUN: 21 mg/dL (ref 8–23)
CO2: 33 mmol/L — ABNORMAL HIGH (ref 22–32)
Calcium: 9.2 mg/dL (ref 8.9–10.3)
Chloride: 103 mmol/L (ref 98–111)
Creatinine: 0.86 mg/dL (ref 0.61–1.24)
GFR, Estimated: >60 mL/min (ref >60)
Glucose, Bld: 112 mg/dL — ABNORMAL HIGH (ref 70–99)
Potassium: 4.2 mmol/L (ref 3.5–5.1)
Sodium: 141 mmol/L (ref 135–145)
Total Bilirubin: 0.4 mg/dL (ref 0.3–1.2)
Total Protein: 7 g/dL (ref 6.5–8.1)

## 2022-08-18 LAB — CBC WITH DIFFERENTIAL (CANCER CENTER ONLY)
Abs Immature Granulocytes: 0.01 10*3/uL (ref 0.00–0.07)
Basophils Absolute: 0.1 10*3/uL (ref 0.0–0.1)
Basophils Relative: 1 %
Eosinophils Absolute: 0.4 10*3/uL (ref 0.0–0.5)
Eosinophils Relative: 5 %
HCT: 37.1 % — ABNORMAL LOW (ref 39.0–52.0)
Hemoglobin: 12.3 g/dL — ABNORMAL LOW (ref 13.0–17.0)
Immature Granulocytes: 0 %
Lymphocytes Relative: 19 %
Lymphs Abs: 1.3 10*3/uL (ref 0.7–4.0)
MCH: 32.4 pg (ref 26.0–34.0)
MCHC: 33.2 g/dL (ref 30.0–36.0)
MCV: 97.6 fL (ref 80.0–100.0)
Monocytes Absolute: 0.5 10*3/uL (ref 0.1–1.0)
Monocytes Relative: 8 %
Neutro Abs: 4.6 10*3/uL (ref 1.7–7.7)
Neutrophils Relative %: 67 %
Platelet Count: 277 10*3/uL (ref 150–400)
RBC: 3.8 MIL/uL — ABNORMAL LOW (ref 4.22–5.81)
RDW: 12.8 % (ref 11.5–15.5)
WBC Count: 6.9 10*3/uL (ref 4.0–10.5)
nRBC: 0 % (ref 0.0–0.2)

## 2022-08-18 LAB — RENAL FUNCTION PANEL
Albumin: 4 g/dL (ref 3.5–5.0)
Anion gap: 9 (ref 5–15)
BUN: 22 mg/dL (ref 8–23)
CO2: 30 mmol/L (ref 22–32)
Calcium: 9.2 mg/dL (ref 8.9–10.3)
Chloride: 102 mmol/L (ref 98–111)
Creatinine, Ser: 0.85 mg/dL (ref 0.61–1.24)
GFR, Estimated: >60 mL/min (ref >60)
Glucose, Bld: 117 mg/dL — ABNORMAL HIGH (ref 70–99)
Phosphorus: 4 mg/dL (ref 2.5–4.6)
Potassium: 4.3 mmol/L (ref 3.5–5.1)
Sodium: 141 mmol/L (ref 135–145)

## 2022-08-18 LAB — IRON AND IRON BINDING CAPACITY (CC-WL,HP ONLY)
Iron: 76 ug/dL (ref 45–182)
Saturation Ratios: 26 % (ref 17.9–39.5)
TIBC: 293 ug/dL (ref 250–450)
UIBC: 217 ug/dL (ref 117–376)

## 2022-08-26 ENCOUNTER — Other Ambulatory Visit: Payer: Self-pay | Admitting: Hematology and Oncology

## 2022-08-26 ENCOUNTER — Inpatient Hospital Stay: Payer: Medicare Other | Admitting: Hematology and Oncology

## 2022-08-26 VITALS — BP 194/65 | HR 62 | Temp 97.8°F | Resp 20 | Wt 140.2 lb

## 2022-08-26 DIAGNOSIS — D500 Iron deficiency anemia secondary to blood loss (chronic): Secondary | ICD-10-CM

## 2022-08-26 DIAGNOSIS — Z87891 Personal history of nicotine dependence: Secondary | ICD-10-CM | POA: Diagnosis not present

## 2022-08-26 DIAGNOSIS — K922 Gastrointestinal hemorrhage, unspecified: Secondary | ICD-10-CM | POA: Diagnosis not present

## 2022-08-26 DIAGNOSIS — D509 Iron deficiency anemia, unspecified: Secondary | ICD-10-CM | POA: Diagnosis not present

## 2022-08-26 NOTE — Progress Notes (Signed)
[facility] Telephone:([phone]   Fax:(336) [phone]  PROGRESS NOTE  Patient Care Team: [name], MD as PCP - General (Family Medicine)  Hematological/Oncological History # Iron Deficiency Anemia of Unclear Etiology 01/14/2022: WBC 8.7, Hgb 12.3, MCV 94.8, Plt 334 02/23/2022: establish care with Dr. Leonides Schanz   Interval History:  Shirlean Kelly 89 y.o. male with medical history significant for iron deficiency anemia who presents for a follow up visit. The patient's last visit was on 05/25/2022 at which time he established care. In the interim since the last visit he has had no major changes in his health.  On exam today Mr. Steer reports that he does not have any major plans for his birthday today.  He reports is been taking his iron pills faithfully in the interim since her last visit.  They are not causing any stomach upset or constipation.  He is taking them in the morning with orange juice.  He is not noticing any bleeding, bruising, or dark stools.  He is not having any blood in his urine.  He reports he does occasionally get dizzy though he is not having any shortness of breath or headache.  His energy levels are low but he notes that he is "getting old".  He reports that he does not eat much in the way of red meat but is trying to eat more leafy green vegetables.  Overall he is at his baseline level of health with no questions concerns or complaints today.[name] Kitchen  He otherwise denies any fevers, chills, sweats, nausea, vomiting or diarrhea.  A full 10 point ROS otherwise negative.  MEDICAL HISTORY:  Past Medical History:  Diagnosis Date   Anxiety    no meds   Arthritis    right knee   Depression    no meds   HLD (hyperlipidemia)    Pre-diabetes    diet controlled - no meds    SURGICAL HISTORY: Past Surgical History:  Procedure Laterality Date   COLONOSCOPY     ELBOW SURGERY Right    EYE SURGERY Bilateral    cataracts surgery   INGUINAL HERNIA REPAIR  Right 04/27/2021   Procedure: OPEN REPAIR RIGHT INGUINAL HERNIA WITH MESH;  Surgeon: Darnell Level, MD;  Location: [facility];  Service: General;  Laterality: Right;   KNEE ARTHROSCOPY Right 08/20/2020   Procedure: ARTHROSCOPY KNEE AND IRRIGATION AND DEBRIDEMENT;  Surgeon: Tarry Kos, MD;  Location: [facility];  Service: Orthopedics;  Laterality: Right;   SHOULDER SURGERY Bilateral    x 2   WISDOM TOOTH EXTRACTION      SOCIAL HISTORY: Social History   Socioeconomic History   Marital status: Married    Spouse name: Not on file   Number of children: Not on file   Years of education: Not on file   Highest education level: Not on file  Occupational History   Not on file  Tobacco Use   Smoking status: Former    Types: Cigarettes   Smokeless tobacco: Never  Vaping Use   Vaping Use: Never used  Substance and Sexual Activity   Alcohol use: Yes    Alcohol/week: 7.0 standard drinks of alcohol    Types: 7 Glasses of wine per week    Comment: occ at night with supper   Drug use: No   Sexual activity: Not on file  Other Topics Concern   Not on file  Social History Narrative   Not on file   Social Determinants of  Health   Financial Resource Strain: Not on file  Food Insecurity: Not on file  Transportation Needs: Not on file  Physical Activity: Not on file  Stress: Not on file  Social Connections: Not on file  Intimate Partner Violence: Not on file    FAMILY HISTORY: No family history on file.  ALLERGIES:  has No Known Allergies.  MEDICATIONS:  Current Outpatient Medications  Medication Sig Dispense Refill   acetaminophen (TYLENOL) 500 MG tablet Take 1,000 mg by mouth every 6 (six) hours as needed for moderate pain.     Ascorbic Acid (VITAMIN C WITH ROSE HIPS) 1000 MG tablet Take 1,000 mg by mouth daily.     aspirin 81 MG chewable tablet Chew 81 mg by mouth daily.     atorvastatin (LIPITOR) 20 MG tablet Take 20 mg by mouth daily.     CINNAMON PO Take 1,000 mg  by mouth daily.     ferrous sulfate 325 (65 FE) MG EC tablet Take 325 mg by mouth daily with breakfast.     Multiple Vitamins-Minerals (CENTRUM SILVER PO) Take 1 tablet by mouth daily.     Omega-3 Fatty Acids (FISH OIL) 1200 MG CAPS Take 1 capsule by mouth daily.     No current facility-administered medications for this visit.    REVIEW OF SYSTEMS:   Constitutional: ( - ) fevers, ( - )  chills , ( - ) night sweats Eyes: ( - ) blurriness of vision, ( - ) double vision, ( - ) watery eyes Ears, nose, mouth, throat, and face: ( - ) mucositis, ( - ) sore throat Respiratory: ( - ) cough, ( - ) dyspnea, ( - ) wheezes Cardiovascular: ( - ) palpitation, ( - ) chest discomfort, ( - ) lower extremity swelling Gastrointestinal:  ( - ) nausea, ( - ) heartburn, ( - ) change in bowel habits Skin: ( - ) abnormal skin rashes Lymphatics: ( - ) new lymphadenopathy, ( - ) easy bruising Neurological: ( - ) numbness, ( - ) tingling, ( - ) new weaknesses Behavioral/Psych: ( - ) mood change, ( - ) new changes  All other systems were reviewed with the patient and are negative.  PHYSICAL EXAMINATION:  Vitals:   08/26/22 1401  BP: (!) 194/65  Pulse: 62  Resp: 20  Temp: 97.8 F (36.6 C)  SpO2: 100%    Filed Weights   08/26/22 1401  Weight: 140 lb 3.2 oz (63.6 kg)     GENERAL: Appearing elderly Caucasian male, alert, no distress and comfortable SKIN: skin color, texture, turgor are normal, no rashes or significant lesions EYES: conjunctiva are pink and non-injected, sclera clear LUNGS: clear to auscultation and percussion with normal breathing effort HEART: regular rate & rhythm and no murmurs and no lower extremity edema Musculoskeletal: no cyanosis of digits and no clubbing  PSYCH: alert & oriented x 3, fluent speech NEURO: no focal motor/sensory deficits  LABORATORY DATA:  I have reviewed the data as listed    Latest Ref Rng & Units 08/18/2022    1:39 PM 05/25/2022    9:48 AM 02/23/2022     2:07 PM  CBC  WBC 4.0 - 10.5 K/uL 6.9  7.8  9.2   Hemoglobin 13.0 - 17.0 g/dL 16.1  09.6  04.5   Hematocrit 39.0 - 52.0 % 37.1  36.4  39.6   Platelets 150 - 400 K/uL 277  265  322        Latest Ref Rng &  Units 08/18/2022    1:39 PM 05/25/2022    9:48 AM 02/23/2022    2:07 PM  CMP  Glucose 70 - 99 mg/dL 70 - 99 mg/dL 161    096  045  409   BUN 8 - 23 mg/dL 8 - 23 mg/dL 22    21  17  19    Creatinine 0.61 - 1.24 mg/dL 8.11 - 9.14 mg/dL 7.82    9.56  2.13  0.86   Sodium 135 - 145 mmol/L 135 - 145 mmol/L 141    141  138  140   Potassium 3.5 - 5.1 mmol/L 3.5 - 5.1 mmol/L 4.3    4.2  4.2  4.0   Chloride 98 - 111 mmol/L 98 - 111 mmol/L 102    103  102  101   CO2 22 - 32 mmol/L 22 - 32 mmol/L 30    33  30  30   Calcium 8.9 - 10.3 mg/dL 8.9 - 57.8 mg/dL 9.2    9.2  8.8  9.6   Total Protein 6.5 - 8.1 g/dL 7.0  6.5  8.2   Total Bilirubin 0.3 - 1.2 mg/dL 0.4  0.4  0.6   Alkaline Phos 38 - 126 U/L 56  61  65   AST 15 - 41 U/L 15  16  21    ALT 0 - 44 U/L 12  14  18      RADIOGRAPHIC STUDIES: No results found.  ASSESSMENT & PLAN AHNAF GORMAN 89 y.o. male with medical history significant for iron deficiency anemia who presents for a follow up visit.   After review of the labs, review of the records, and discussion with the patient the patients findings are most consistent with iron deficiency anemia, likely secondary to GI bleeding.  At this time the patient does not have any overt signs or symptoms of bleeding.  He has been taking iron pills without much improvement in his hemoglobin or iron stores.  As such I would recommend pursuing EGD.  We will recheck his labs today.  If he is persistently anemic with iron deficiency we can pursue IV iron therapy and this would be considered p.o. iron therapy failure.  The patient voiced understanding of the plan moving forward.   # Iron Deficiency Anemia 2/2 Unclear Etiology, Suspect due to GI Bleeding -- Findings are consistent with iron  deficiency anemia secondary GI bleeding.  --Encouraged her to follow-up with GI for further evaluation.  He currently follows with [facility].  Patient notes he does not wish to undergo endoscopic evaluation. --Continue ferrous sulfate 325 mg daily with a source of vitamin C --Due to the fact his iron levels remain borderline and his hemoglobin remains at 12.3 would recommend pursuing IV iron therapy. --Labs today show white blood cell count 6.9, Hgb 12.3, MCV 97.6, Plt 277 --Plan for return to clinic in 3 months or sooner if IV iron is required.   No orders of the defined types were placed in this encounter.   All questions were answered. The patient knows to call the clinic with any problems, questions or concerns.  A total of more than 30 minutes were spent on this encounter with face-to-face time and non-face-to-face time, including preparing to see the patient, ordering tests and/or medications, counseling the patient and coordination of care as outlined above.   Ulysees Barns, MD Department of Hematology/Oncology [facility] at [facility] Phone: [phone] Pager: [phone] Email: [email or web address]  08/26/2022 3:51 PM

## 2022-09-01 ENCOUNTER — Telehealth: Payer: Self-pay | Admitting: Pharmacy Technician

## 2022-09-01 ENCOUNTER — Other Ambulatory Visit (HOSPITAL_COMMUNITY): Payer: Self-pay | Admitting: Pharmacy Technician

## 2022-09-01 NOTE — Telephone Encounter (Signed)
Dr. Leonides Schanz, Monoferric is non preferred and will be denied if patient has not failed preferred medication. Preferred medication is Feraheme or Venofer. Would you like to try Feraheme?  Auth Submission: NO AUTH NEEDED Site of care: Site of care: [facility] Payer: [employer] MEDICARE Medication & CPT/J Code(s) submitted: Feraheme (ferumoxytol) [id number] Route of submission (phone, fax, portal):  Phone # Fax # Auth type: Buy/Bill Units/visits requested: x2 Reference number:  Approval from: 09/01/22 to 01/01/23

## 2022-09-06 DIAGNOSIS — C44629 Squamous cell carcinoma of skin of left upper limb, including shoulder: Secondary | ICD-10-CM | POA: Diagnosis not present

## 2022-09-06 DIAGNOSIS — L905 Scar conditions and fibrosis of skin: Secondary | ICD-10-CM | POA: Diagnosis not present

## 2022-09-06 DIAGNOSIS — D485 Neoplasm of uncertain behavior of skin: Secondary | ICD-10-CM | POA: Diagnosis not present

## 2022-09-12 ENCOUNTER — Ambulatory Visit (INDEPENDENT_AMBULATORY_CARE_PROVIDER_SITE_OTHER): Payer: Medicare Other

## 2022-09-12 VITALS — BP 174/66 | HR 53 | Temp 97.5°F | Resp 18 | Ht 69.0 in | Wt 139.8 lb

## 2022-09-12 DIAGNOSIS — D509 Iron deficiency anemia, unspecified: Secondary | ICD-10-CM | POA: Diagnosis not present

## 2022-09-12 DIAGNOSIS — D500 Iron deficiency anemia secondary to blood loss (chronic): Secondary | ICD-10-CM

## 2022-09-12 MED ORDER — DIPHENHYDRAMINE HCL 25 MG PO CAPS
25.0000 mg | ORAL_CAPSULE | Freq: Once | ORAL | Status: AC
  Administered 2022-09-12: 25 mg via ORAL
  Filled 2022-09-12: qty 1

## 2022-09-12 MED ORDER — SODIUM CHLORIDE 0.9 % IV SOLN
510.0000 mg | Freq: Once | INTRAVENOUS | Status: AC
  Administered 2022-09-12: 510 mg via INTRAVENOUS
  Filled 2022-09-12: qty 17

## 2022-09-12 MED ORDER — ACETAMINOPHEN 325 MG PO TABS
650.0000 mg | ORAL_TABLET | Freq: Once | ORAL | Status: AC
  Administered 2022-09-12: 650 mg via ORAL
  Filled 2022-09-12: qty 2

## 2022-09-12 NOTE — Progress Notes (Signed)
Diagnosis: Iron Deficiency Anemia  Provider:  Chilton Greathouse MD  Procedure: IV Infusion  IV Type: Peripheral, IV Location: R Forearm  Feraheme (Ferumoxytol), Dose: 510 mg  Infusion Start Time: 1345  Infusion Stop Time: 1402  Post Infusion IV Care: Observation period completed and Peripheral IV Discontinued  Discharge: Condition: Good, Destination: Home . AVS Provided  Performed by:  Garnette Czech, RN

## 2022-09-19 ENCOUNTER — Ambulatory Visit (INDEPENDENT_AMBULATORY_CARE_PROVIDER_SITE_OTHER): Payer: Medicare Other | Admitting: *Deleted

## 2022-09-19 VITALS — BP 168/60 | HR 54 | Temp 97.7°F | Resp 16 | Ht 69.0 in | Wt 140.8 lb

## 2022-09-19 DIAGNOSIS — D509 Iron deficiency anemia, unspecified: Secondary | ICD-10-CM | POA: Diagnosis not present

## 2022-09-19 DIAGNOSIS — D500 Iron deficiency anemia secondary to blood loss (chronic): Secondary | ICD-10-CM

## 2022-09-19 MED ORDER — DIPHENHYDRAMINE HCL 25 MG PO CAPS
25.0000 mg | ORAL_CAPSULE | Freq: Once | ORAL | Status: AC
  Administered 2022-09-19: 25 mg via ORAL
  Filled 2022-09-19: qty 1

## 2022-09-19 MED ORDER — SODIUM CHLORIDE 0.9 % IV SOLN
510.0000 mg | Freq: Once | INTRAVENOUS | Status: AC
  Administered 2022-09-19: 510 mg via INTRAVENOUS
  Filled 2022-09-19: qty 17

## 2022-09-19 MED ORDER — ACETAMINOPHEN 325 MG PO TABS
650.0000 mg | ORAL_TABLET | Freq: Once | ORAL | Status: AC
  Administered 2022-09-19: 650 mg via ORAL
  Filled 2022-09-19: qty 2

## 2022-09-19 NOTE — Progress Notes (Signed)
Diagnosis: Iron Deficiency Anemia  Provider:  Chilton Greathouse MD  Procedure: IV Infusion  IV Type: Peripheral, IV Location: R Forearm  Feraheme (Ferumoxytol), Dose: 510 mg  Infusion Start Time: 1350 pm  Infusion Stop Time: 1405  pm  Post Infusion IV Care: Observation period completed and Peripheral IV Discontinued  Discharge: Condition: Good, Destination: Home . AVS Declined  Performed by:  Forrest Moron, RN

## 2022-09-27 ENCOUNTER — Emergency Department (HOSPITAL_BASED_OUTPATIENT_CLINIC_OR_DEPARTMENT_OTHER): Payer: Medicare Other | Admitting: Radiology

## 2022-09-27 ENCOUNTER — Emergency Department (HOSPITAL_BASED_OUTPATIENT_CLINIC_OR_DEPARTMENT_OTHER)
Admission: EM | Admit: 2022-09-27 | Discharge: 2022-09-27 | Disposition: A | Discharge status: Home / Self Care | Payer: Medicare Other | Attending: Emergency Medicine | Admitting: Emergency Medicine

## 2022-09-27 ENCOUNTER — Encounter (HOSPITAL_BASED_OUTPATIENT_CLINIC_OR_DEPARTMENT_OTHER): Payer: Self-pay

## 2022-09-27 ENCOUNTER — Other Ambulatory Visit: Payer: Self-pay

## 2022-09-27 DIAGNOSIS — M25511 Pain in right shoulder: Secondary | ICD-10-CM | POA: Diagnosis not present

## 2022-09-27 DIAGNOSIS — M19011 Primary osteoarthritis, right shoulder: Secondary | ICD-10-CM | POA: Diagnosis not present

## 2022-09-27 DIAGNOSIS — X500XXA Overexertion from strenuous movement or load, initial encounter: Secondary | ICD-10-CM | POA: Insufficient documentation

## 2022-09-27 DIAGNOSIS — S42144A Nondisplaced fracture of glenoid cavity of scapula, right shoulder, initial encounter for closed fracture: Secondary | ICD-10-CM | POA: Diagnosis not present

## 2022-09-27 DIAGNOSIS — Z7982 Long term (current) use of aspirin: Secondary | ICD-10-CM | POA: Diagnosis not present

## 2022-09-27 DIAGNOSIS — S42141A Displaced fracture of glenoid cavity of scapula, right shoulder, initial encounter for closed fracture: Secondary | ICD-10-CM

## 2022-09-27 DIAGNOSIS — S4991XA Unspecified injury of right shoulder and upper arm, initial encounter: Secondary | ICD-10-CM | POA: Diagnosis not present

## 2022-09-27 NOTE — ED Triage Notes (Signed)
Patient here POV from Home.  Endorses injuring his Right Shoulder while opening a heavy door on Friday. Pain since. Limited Movement.  NAD Noted during Triage. A&Ox4. GCS 15. Ambulatory.

## 2022-09-27 NOTE — ED Provider Notes (Signed)
[facility] [place] EMERGENCY DEPARTMENT AT [facility] Provider Note   CSN: [id number] Arrival date & time: 09/27/22  1347     History Chief Complaint  Patient presents with   Shoulder Pain    Jeremy Holmes is a 89 y.o. male.  Patient presented to the emergency department complaints of right shoulder pain.  He reports that he was opening a door when he felt that his right shoulder began to ache.  Denies any obvious deformity or dislocation at that time.  Prior history of shoulder surgery for rotator cuff injuries in this area.  Denies any weakness or numbness at this limb.    Shoulder Pain      Home Medications Prior to Admission medications   Medication Sig Start Date End Date Taking? Authorizing Provider  acetaminophen (TYLENOL) 500 MG tablet Take 1,000 mg by mouth every 6 (six) hours as needed for moderate pain.    [provider]  Ascorbic Acid (VITAMIN C WITH ROSE HIPS) 1000 MG tablet Take 1,000 mg by mouth daily.    [provider]  aspirin 81 MG chewable tablet Chew 81 mg by mouth daily.    [provider]  atorvastatin (LIPITOR) 20 MG tablet Take 20 mg by mouth daily.    [provider]  CINNAMON PO Take 1,000 mg by mouth daily.    [provider]  ferrous sulfate 325 (65 FE) MG EC tablet Take 325 mg by mouth daily with breakfast.    [provider]  Multiple Vitamins-Minerals (CENTRUM SILVER PO) Take 1 tablet by mouth daily.    [provider]  Omega-3 Fatty Acids (FISH OIL) 1200 MG CAPS Take 1 capsule by mouth daily.    [provider]      Allergies    Patient has no known allergies.    Review of Systems   Review of Systems  Musculoskeletal:        Shoulder pain  All other systems reviewed and are negative.   Physical Exam Updated Vital Signs BP (!) 161/66 (BP Location: Right Arm)   Pulse 64   Temp (!) 97.4 F (36.3 C) (Temporal)   Resp 16   Ht 5\' 9"  (1.753 m)   Wt 65.8 kg   SpO2  100%   BMI 21.41 kg/m  Physical Exam Vitals and nursing note reviewed.  Constitutional:      General: He is not in acute distress.    Appearance: He is well-developed.  HENT:     Head: Normocephalic and atraumatic.  Eyes:     Conjunctiva/sclera: Conjunctivae normal.  Cardiovascular:     Rate and Rhythm: Normal rate and regular rhythm.     Heart sounds: No murmur heard. Pulmonary:     Effort: Pulmonary effort is normal. No respiratory distress.     Breath sounds: Normal breath sounds.  Abdominal:     Palpations: Abdomen is soft.     Tenderness: There is no abdominal tenderness.  Musculoskeletal:        General: Tenderness present. No swelling, deformity or signs of injury.     Cervical back: Neck supple.     Comments: Limited range of motion in right shoulder due to pain. Concerning for reinjury of rotator cuff.  Skin:    General: Skin is warm and dry.     Capillary Refill: Capillary refill takes less than 2 seconds.  Neurological:     Mental Status: He is alert.  Psychiatric:  Mood and Affect: Mood normal.     ED Results / Procedures / Treatments   Labs (all labs ordered are listed, but only abnormal results are displayed) Labs Reviewed - No data to display  EKG None  Radiology DG Shoulder Right  Result Date: 09/27/2022 CLINICAL DATA:  Right Shoulder Pain for 3-4 Days. EXAM: RIGHT SHOULDER - 2+ VIEW COMPARISON:  None Available. FINDINGS: Possible lucency through the posterior glenoid on the axillary view. No joint malalignment. Moderate osteoarthritis. IMPRESSION: Possible nondisplaced fracture through the posterior glenoid rim. Consider cross-sectional imaging to better assess. No joint malalignment. Electronically Signed   By: Feliberto Harts M.D.   On: 09/27/2022 16:15    Procedures Procedures   Medications Ordered in ED Medications - No data to display  ED Course/ Medical Decision Making/ A&P Clinical Course as of 09/27/22 1700  Tue Sep 27, 2022   1634 Stable CCX of right shoulder pain.  [CC]  1641 Plan for outpatient follow-up with orthopedics. [CC]    Clinical Course User Index [CC] Glyn Ade, MD                           Medical Decision Making Amount and/or Complexity of Data Reviewed Radiology: ordered.   This patient presents to the ED for concern of shoulder pain.  Differential diagnosis includes shoulder dislocation, SLAP lesion, rotator cuff tear, humeral neck fracture, AC joint injury   Imaging Studies ordered:  I ordered imaging studies including x-ray of right shoulder I independently visualized and interpreted imaging which showed suspected nondisplaced fracture of the posterior glenoid rim I agree with the radiologist interpretation   Problem List / ED Course:  Patient presented to the emergency department with complaints of right shoulder pain.  He reports that he injured his shoulder while he was opening a heavy door a few days ago.  Since then he is endorsing limited movement and pain in this area.  This previously had rotator cuff surgery for injuries and the site.  Last seen by orthopedics for lower extremity concerns but shoulder surgery was years ago.  Will evaluate with x-ray imaging. X-ray shows possible nondisplaced fracture of the posterior glenoid rim.  On physical exam, patient does have some limitation in range of motion as he is not able to have full extension and flexion of the shoulder and some weakness with abduction.  Concern for possible rotator cuff injury but no prior surgery at this site.  Advised patient he should follow-up with orthopedics for reevaluation.  Will provide patient a shoulder sling/immobilizer to improve comfort.  Patient is agreeable with treatment plan and verbalized understanding all return precautions.  Patient remains hemodynamically stable here at this time and I believe he is safe and stable for discharge for outpatient follow-up.  All questions answered prior to  patient discharge.  Final Clinical Impression(s) / ED Diagnoses Final diagnoses:  Acute pain of right shoulder  Glenoid fracture of shoulder, right, closed, initial encounter    Rx / DC Orders ED Discharge Orders     None         Smitty Knudsen, PA-C 09/27/22 1700    Benjiman Core, MD 09/28/22 1315

## 2022-09-27 NOTE — Discharge Instructions (Signed)
You were seen in the emergency department for right shoulder pain.  Your x-ray imaging was positive for a suspected fracture of the posterior glenoid.  I would advise following up with orthopedics for further evaluation.  If you have any acute decline in your symptoms, please return to the emergency department.

## 2022-10-11 ENCOUNTER — Encounter: Payer: Self-pay | Admitting: Orthopaedic Surgery

## 2022-10-11 ENCOUNTER — Ambulatory Visit: Payer: Medicare Other | Admitting: Orthopaedic Surgery

## 2022-10-11 DIAGNOSIS — M25511 Pain in right shoulder: Secondary | ICD-10-CM

## 2022-10-11 NOTE — Progress Notes (Signed)
Office Visit Note   Patient: Jeremy Holmes           Date of Birth: 07/03/1933           MRN: [id number] Visit Date: 10/11/2022              Requested by: [name], MD [address] [place],  [place] [address] PCP: [name], MD   Assessment & Plan: Visit Diagnoses:  1. Acute pain of right shoulder     Plan: Jeremy Holmes is a 89 year old gentleman with posterior right shoulder pain.  I feel like the mechanism is not consistent with the radiographic findings.  The x-rays look like a posterior osteophyte that is questionably fracture.  I think at this stage I would not recommend any intervention.  I recommend continued symptomatic treatment and giving this a little bit more time and I would expect this to fully resolve.  Follow-Up Instructions: No follow-ups on file.   Orders:  No orders of the defined types were placed in this encounter.  No orders of the defined types were placed in this encounter.     Procedures: No procedures performed   Clinical Data: No additional findings.   Subjective: Chief Complaint  Patient presents with   Right Shoulder - Pain    HPI Jeremy Holmes is a very pleasant 89 year old gentleman comes in for evaluation of right shoulder pain that acutely began when he was opening a very heavy door for his wife and he felt something in the back of his shoulder.  He later went to the [facility] ED and had x-rays and they were concerning for a posterior glenoid fracture.  He states that since the injury he has felt better and he feels that overall there is improvement. Review of Systems  Constitutional: Negative.   HENT: Negative.    Eyes: Negative.   Respiratory: Negative.    Cardiovascular: Negative.   Gastrointestinal: Negative.   Endocrine: Negative.   Genitourinary: Negative.   Skin: Negative.   Allergic/Immunologic: Negative.   Neurological: Negative.   Hematological: Negative.   Psychiatric/Behavioral: Negative.    All other systems reviewed  and are negative.    Objective: Vital Signs: There were no vitals taken for this visit.  Physical Exam Vitals and nursing note reviewed.  Constitutional:      Appearance: He is well-developed.  HENT:     Head: Normocephalic and atraumatic.  Eyes:     Pupils: Pupils are equal, round, and reactive to light.  Pulmonary:     Effort: Pulmonary effort is normal.  Abdominal:     Palpations: Abdomen is soft.  Musculoskeletal:        General: Normal range of motion.     Cervical back: Neck supple.  Skin:    General: Skin is warm.  Neurological:     Mental Status: He is alert and oriented to person, place, and time.  Psychiatric:        Behavior: Behavior normal.        Thought Content: Thought content normal.        Judgment: Judgment normal.     Ortho Exam Examination of the right shoulder shows full passive range of motion without pain.  Manual muscle testing of the rotator cuff shows slight pain with resistance to infraspinatus. Specialty Comments:  No specialty comments available.  Imaging: No results found.   PMFS History: Patient Active Problem List   Diagnosis Date Noted   Iron deficiency anemia due to  chronic blood loss 08/26/2022   Right inguinal hernia 04/23/2021   Early stage nonexudative age-related macular degeneration of both eyes 04/20/2021   History of vitrectomy 04/20/2021   Pyogenic arthritis of right knee joint (HCC) 09/19/2020   Medication monitoring encounter 09/02/2020   Septic arthritis of knee, right (HCC) 08/19/2020   Pes planus of left foot 03/12/2020   Unilateral primary osteoarthritis, right knee 10/15/2019   Baker cyst, right 10/15/2019   Generalized anxiety disorder 06/07/2014   Adjustment disorder with depressed mood 06/07/2014   Past Medical History:  Diagnosis Date   Anxiety    no meds   Arthritis    right knee   Depression    no meds   HLD (hyperlipidemia)    Pre-diabetes    diet controlled - no meds    No family history  on file.  Past Surgical History:  Procedure Laterality Date   COLONOSCOPY     ELBOW SURGERY Right    EYE SURGERY Bilateral    cataracts surgery   INGUINAL HERNIA REPAIR Right 04/27/2021   Procedure: OPEN REPAIR RIGHT INGUINAL HERNIA WITH MESH;  Surgeon: Darnell Level, MD;  Location: [facility];  Service: General;  Laterality: Right;   KNEE ARTHROSCOPY Right 08/20/2020   Procedure: ARTHROSCOPY KNEE AND IRRIGATION AND DEBRIDEMENT;  Surgeon: Tarry Kos, MD;  Location: [facility] OR;  Service: Orthopedics;  Laterality: Right;   SHOULDER SURGERY Bilateral    x 2   WISDOM TOOTH EXTRACTION     Social History   Occupational History   Not on file  Tobacco Use   Smoking status: Former    Types: Cigarettes   Smokeless tobacco: Never  Vaping Use   Vaping status: Never Used  Substance and Sexual Activity   Alcohol use: Yes    Alcohol/week: 7.0 standard drinks of alcohol    Types: 7 Glasses of wine per week    Comment: occ at night with supper   Drug use: No   Sexual activity: Not on file

## 2022-10-20 DIAGNOSIS — U071 COVID-19: Secondary | ICD-10-CM | POA: Diagnosis not present

## 2022-11-18 ENCOUNTER — Other Ambulatory Visit: Payer: Self-pay

## 2022-11-18 ENCOUNTER — Inpatient Hospital Stay: Payer: Medicare Other | Attending: Hematology and Oncology

## 2022-11-18 DIAGNOSIS — Z87891 Personal history of nicotine dependence: Secondary | ICD-10-CM | POA: Insufficient documentation

## 2022-11-18 DIAGNOSIS — D509 Iron deficiency anemia, unspecified: Secondary | ICD-10-CM | POA: Diagnosis not present

## 2022-11-18 DIAGNOSIS — D500 Iron deficiency anemia secondary to blood loss (chronic): Secondary | ICD-10-CM

## 2022-11-18 LAB — CBC WITH DIFFERENTIAL (CANCER CENTER ONLY)
Abs Immature Granulocytes: 0.02 10*3/uL (ref 0.00–0.07)
Basophils Absolute: 0 10*3/uL (ref 0.0–0.1)
Basophils Relative: 1 %
Eosinophils Absolute: 0.2 10*3/uL (ref 0.0–0.5)
Eosinophils Relative: 3 %
HCT: 36 % — ABNORMAL LOW (ref 39.0–52.0)
Hemoglobin: 12.5 g/dL — ABNORMAL LOW (ref 13.0–17.0)
Immature Granulocytes: 0 %
Lymphocytes Relative: 19 %
Lymphs Abs: 1.4 10*3/uL (ref 0.7–4.0)
MCH: 34 pg (ref 26.0–34.0)
MCHC: 34.7 g/dL (ref 30.0–36.0)
MCV: 97.8 fL (ref 80.0–100.0)
Monocytes Absolute: 0.6 10*3/uL (ref 0.1–1.0)
Monocytes Relative: 8 %
Neutro Abs: 5.1 10*3/uL (ref 1.7–7.7)
Neutrophils Relative %: 69 %
Platelet Count: 286 10*3/uL (ref 150–400)
RBC: 3.68 MIL/uL — ABNORMAL LOW (ref 4.22–5.81)
RDW: 13.3 % (ref 11.5–15.5)
WBC Count: 7.3 10*3/uL (ref 4.0–10.5)
nRBC: 0 % (ref 0.0–0.2)

## 2022-11-18 LAB — CMP (CANCER CENTER ONLY)
ALT: 17 U/L (ref 0–44)
AST: 17 U/L (ref 15–41)
Albumin: 4.5 g/dL (ref 3.5–5.0)
Alkaline Phosphatase: 59 U/L (ref 38–126)
Anion gap: 5 (ref 5–15)
BUN: 18 mg/dL (ref 8–23)
CO2: 33 mmol/L — ABNORMAL HIGH (ref 22–32)
Calcium: 9.6 mg/dL (ref 8.9–10.3)
Chloride: 101 mmol/L (ref 98–111)
Creatinine: 0.79 mg/dL (ref 0.61–1.24)
GFR, Estimated: >60 mL/min (ref >60)
Glucose, Bld: 146 mg/dL — ABNORMAL HIGH (ref 70–99)
Potassium: 4.3 mmol/L (ref 3.5–5.1)
Sodium: 139 mmol/L (ref 135–145)
Total Bilirubin: 0.4 mg/dL (ref 0.3–1.2)
Total Protein: 7.2 g/dL (ref 6.5–8.1)

## 2022-11-18 LAB — RETIC PANEL
Immature Retic Fract: 10.5 % (ref 2.3–15.9)
RBC.: 3.66 MIL/uL — ABNORMAL LOW (ref 4.22–5.81)
Retic Count, Absolute: 39.2 10*3/uL (ref 19.0–186.0)
Retic Ct Pct: 1.1 % (ref 0.4–3.1)
Reticulocyte Hemoglobin: 37.8 pg (ref >27.9)

## 2022-11-18 LAB — IRON AND IRON BINDING CAPACITY (CC-WL,HP ONLY)
Iron: 97 ug/dL (ref 45–182)
Saturation Ratios: 37 % (ref 17.9–39.5)
TIBC: 262 ug/dL (ref 250–450)
UIBC: 165 ug/dL (ref 117–376)

## 2022-11-18 LAB — FERRITIN: Ferritin: 308 ng/mL (ref 24–336)

## 2022-11-24 ENCOUNTER — Other Ambulatory Visit: Payer: Self-pay | Admitting: Physician Assistant

## 2022-11-24 DIAGNOSIS — D500 Iron deficiency anemia secondary to blood loss (chronic): Secondary | ICD-10-CM

## 2022-11-25 ENCOUNTER — Ambulatory Visit: Payer: Medicare Other | Admitting: Hematology and Oncology

## 2022-11-25 ENCOUNTER — Inpatient Hospital Stay: Payer: Medicare Other | Admitting: Physician Assistant

## 2022-11-25 VITALS — BP 149/67 | HR 70 | Temp 98.0°F | Resp 13 | Wt 138.5 lb

## 2022-11-25 DIAGNOSIS — Z87891 Personal history of nicotine dependence: Secondary | ICD-10-CM | POA: Diagnosis not present

## 2022-11-25 DIAGNOSIS — D500 Iron deficiency anemia secondary to blood loss (chronic): Secondary | ICD-10-CM | POA: Diagnosis not present

## 2022-11-25 DIAGNOSIS — D509 Iron deficiency anemia, unspecified: Secondary | ICD-10-CM | POA: Diagnosis not present

## 2022-11-25 NOTE — Progress Notes (Signed)
[facility] [phone]   [phone]  PROGRESS NOTE  Patient Care Team: [name], MD as PCP - General (Family Medicine)  Hematological/Oncological History # Iron Deficiency Anemia of Unclear Etiology 01/14/2022: WBC 8.7, Hgb 12.3, MCV 94.8, Plt 334 02/23/2022: establish care with Dr. Leonides Schanz  09/12/22-09/19/22: Received IV feraheme 510 mg x 2 doses   Interval History:  Jeremy Holmes 89 y.o. male with medical history significant for iron deficiency anemia who presents for a follow up visit. The patient's last visit was on 08/26/2022 at which time he established care. In the interim since the last visit, he received IV feraheme x 2 doses. He is unaccompanied for this visit.   On exam today Jeremy Holmes reports no significant changes after receiving IV iron. He reports his energy is stable with some fatigue that he attributes to his age. He denies any appetite or weight changes. He denies nausea, vomiting or bowel habit changes. He does bruise easily due to aspirin therapy but denies any overt signs of bleeding. He denies fevers, chills, sweats, shortness of breath, chest pain or cough. He has no other complaints.  A full 10 point ROS otherwise negative.  MEDICAL HISTORY:  Past Medical History:  Diagnosis Date   Anxiety    no meds   Arthritis    right knee   Depression    no meds   HLD (hyperlipidemia)    Pre-diabetes    diet controlled - no meds    SURGICAL HISTORY: Past Surgical History:  Procedure Laterality Date   COLONOSCOPY     ELBOW SURGERY Right    EYE SURGERY Bilateral    cataracts surgery   INGUINAL HERNIA REPAIR Right 04/27/2021   Procedure: OPEN REPAIR RIGHT INGUINAL HERNIA WITH MESH;  Surgeon: Darnell Level, MD;  Location: [facility];  Service: General;  Laterality: Right;   KNEE ARTHROSCOPY Right 08/20/2020   Procedure: ARTHROSCOPY KNEE AND IRRIGATION AND DEBRIDEMENT;  Surgeon: Tarry Kos, MD;  Location: [facility]  OR;  Service: Orthopedics;  Laterality: Right;   SHOULDER SURGERY Bilateral    x 2   WISDOM TOOTH EXTRACTION      SOCIAL HISTORY: Social History   Socioeconomic History   Marital status: Married    Spouse name: Not on file   Number of children: Not on file   Years of education: Not on file   Highest education level: Not on file  Occupational History   Not on file  Tobacco Use   Smoking status: Former    Types: Cigarettes   Smokeless tobacco: Never  Vaping Use   Vaping status: Never Used  Substance and Sexual Activity   Alcohol use: Yes    Alcohol/week: 7.0 standard drinks of alcohol    Types: 7 Glasses of wine per week    Comment: occ at night with supper   Drug use: No   Sexual activity: Not on file  Other Topics Concern   Not on file  Social History Narrative   Not on file   Social Determinants of Health   Financial Resource Strain: Not on file  Food Insecurity: Not on file  Transportation Needs: Not on file  Physical Activity: Not on file  Stress: Not on file  Social Connections: Not on file  Intimate Partner Violence: Not on file    FAMILY HISTORY: No family history on file.  ALLERGIES:  has No Known Allergies.  MEDICATIONS:  Current Outpatient Medications  Medication Sig Dispense Refill  acetaminophen (TYLENOL) 500 MG tablet Take 1,000 mg by mouth every 6 (six) hours as needed for moderate pain.     Ascorbic Acid (VITAMIN C WITH ROSE HIPS) 1000 MG tablet Take 1,000 mg by mouth daily.     aspirin 81 MG chewable tablet Chew 81 mg by mouth daily.     atorvastatin (LIPITOR) 20 MG tablet Take 20 mg by mouth daily.     CINNAMON PO Take 1,000 mg by mouth daily.     ferrous sulfate 325 (65 FE) MG EC tablet Take 325 mg by mouth daily with breakfast.     Multiple Vitamins-Minerals (CENTRUM SILVER PO) Take 1 tablet by mouth daily.     Omega-3 Fatty Acids (FISH OIL) 1200 MG CAPS Take 1 capsule by mouth daily.     No current facility-administered medications  for this visit.    REVIEW OF SYSTEMS:   Constitutional: ( - ) fevers, ( - )  chills , ( - ) night sweats Eyes: ( - ) blurriness of vision, ( - ) double vision, ( - ) watery eyes Ears, nose, mouth, throat, and face: ( - ) mucositis, ( - ) sore throat Respiratory: ( - ) cough, ( - ) dyspnea, ( - ) wheezes Cardiovascular: ( - ) palpitation, ( - ) chest discomfort, ( - ) lower extremity swelling Gastrointestinal:  ( - ) nausea, ( - ) heartburn, ( - ) change in bowel habits Skin: ( - ) abnormal skin rashes Lymphatics: ( - ) new lymphadenopathy, ( - ) easy bruising Neurological: ( - ) numbness, ( - ) tingling, ( - ) new weaknesses Behavioral/Psych: ( - ) mood change, ( - ) new changes  All other systems were reviewed with the patient and are negative.  PHYSICAL EXAMINATION:  Vitals:   11/25/22 1255  BP: (!) 149/67  Pulse: 70  Resp: 13  Temp: 98 F (36.7 C)  SpO2: 93%    Filed Weights   11/25/22 1255  Weight: 138 lb 8 oz (62.8 kg)     GENERAL: Elderly appearing Caucasian male, alert, no distress and comfortable SKIN: skin color, texture, turgor are normal, no rashes or significant lesions EYES: conjunctiva are pink and non-injected, sclera clear LUNGS: clear to auscultation and percussion with normal breathing effort HEART: regular rate & rhythm and no murmurs and no lower extremity edema Musculoskeletal: no cyanosis of digits and no clubbing  PSYCH: alert & oriented x 3, fluent speech NEURO: no focal motor/sensory deficits  LABORATORY DATA:  I have reviewed the data as listed    Latest Ref Rng & Units 11/18/2022    1:01 PM 08/18/2022    1:39 PM 05/25/2022    9:48 AM  CBC  WBC 4.0 - 10.5 K/uL 7.3  6.9  7.8   Hemoglobin 13.0 - 17.0 g/dL 29.5  62.1  30.8   Hematocrit 39.0 - 52.0 % 36.0  37.1  36.4   Platelets 150 - 400 K/uL 286  277  265        Latest Ref Rng & Units 11/18/2022    1:01 PM 08/18/2022    1:39 PM 05/25/2022    9:48 AM  CMP  Glucose 70 - 99 mg/dL 657  846     962  952   BUN 8 - 23 mg/dL 18  22    21  17    Creatinine 0.61 - 1.24 mg/dL 8.41  3.24    4.01  0.27   Sodium 135 - 145 mmol/L  139  141    141  138   Potassium 3.5 - 5.1 mmol/L 4.3  4.3    4.2  4.2   Chloride 98 - 111 mmol/L 101  102    103  102   CO2 22 - 32 mmol/L 33  30    33  30   Calcium 8.9 - 10.3 mg/dL 9.6  9.2    9.2  8.8   Total Protein 6.5 - 8.1 g/dL 7.2  7.0  6.5   Total Bilirubin 0.3 - 1.2 mg/dL 0.4  0.4  0.4   Alkaline Phos 38 - 126 U/L 59  56  61   AST 15 - 41 U/L 17  15  16    ALT 0 - 44 U/L 17  12  14      RADIOGRAPHIC STUDIES: No results found.  ASSESSMENT & PLAN Jeremy Holmes is a 89 y.o. male with medical history significant for iron deficiency anemia who presents for a follow up visit.   .   # Iron Deficiency Anemia 2/2 Unclear Etiology, Suspect due to GI Bleeding -- Findings are consistent with iron deficiency anemia secondary GI bleeding.  --Encouraged her to follow-up with GI for further evaluation.  He currently follows with [facility].  Patient notes he does not wish to undergo endoscopic evaluation. --Received IV feraheme 510 mg x 2 doses from 09/12/2022-09/19/2022.  --Labs from 11/17/2021 showed mild anemia with Hgb 12.5, MCV 97.8. Iron panel shows improvement with ferritin 308, iron 97, TIBC 262 and saturation 37%.  --No need for additional IV iron at this time.  --Continue ferrous sulfate 325 mg daily with a source of vitamin C --RTC in 3 months for labs only and 6 months for labs/follow up.  No orders of the defined types were placed in this encounter.   All questions were answered. The patient knows to call the clinic with any problems, questions or concerns.  I have spent a total of 25 minutes minutes of face-to-face and non-face-to-face time, preparing to see the patient,  performing a medically appropriate examination, counseling and educating the patient, documenting clinical information in the electronic health record, independently  interpreting results and communicating results to the patient, and care coordination.   Georga Kaufmann PA-C Dept of Hematology and Oncology [facility] Phone: [phone]   11/25/2022 1:04 PM

## 2023-01-27 DIAGNOSIS — Z23 Encounter for immunization: Secondary | ICD-10-CM | POA: Diagnosis not present

## 2023-01-27 DIAGNOSIS — Z0000 Encounter for general adult medical examination without abnormal findings: Secondary | ICD-10-CM | POA: Diagnosis not present

## 2023-01-27 DIAGNOSIS — E785 Hyperlipidemia, unspecified: Secondary | ICD-10-CM | POA: Diagnosis not present

## 2023-01-27 DIAGNOSIS — D509 Iron deficiency anemia, unspecified: Secondary | ICD-10-CM | POA: Diagnosis not present

## 2023-01-27 DIAGNOSIS — N401 Enlarged prostate with lower urinary tract symptoms: Secondary | ICD-10-CM | POA: Diagnosis not present

## 2023-01-27 DIAGNOSIS — E119 Type 2 diabetes mellitus without complications: Secondary | ICD-10-CM | POA: Diagnosis not present

## 2023-01-30 ENCOUNTER — Inpatient Hospital Stay: Payer: Medicare Other | Attending: Hematology and Oncology

## 2023-01-30 DIAGNOSIS — D509 Iron deficiency anemia, unspecified: Secondary | ICD-10-CM | POA: Insufficient documentation

## 2023-01-30 DIAGNOSIS — D500 Iron deficiency anemia secondary to blood loss (chronic): Secondary | ICD-10-CM

## 2023-01-30 LAB — CBC WITH DIFFERENTIAL (CANCER CENTER ONLY)
Abs Immature Granulocytes: 0.04 K/uL (ref 0.00–0.07)
Basophils Absolute: 0.1 K/uL (ref 0.0–0.1)
Basophils Relative: 1 %
Eosinophils Absolute: 0.2 K/uL (ref 0.0–0.5)
Eosinophils Relative: 3 %
HCT: 38.4 % — ABNORMAL LOW (ref 39.0–52.0)
Hemoglobin: 13 g/dL (ref 13.0–17.0)
Immature Granulocytes: 1 %
Lymphocytes Relative: 15 %
Lymphs Abs: 1.1 K/uL (ref 0.7–4.0)
MCH: 34.2 pg — ABNORMAL HIGH (ref 26.0–34.0)
MCHC: 33.9 g/dL (ref 30.0–36.0)
MCV: 101.1 fL — ABNORMAL HIGH (ref 80.0–100.0)
Monocytes Absolute: 0.8 K/uL (ref 0.1–1.0)
Monocytes Relative: 10 %
Neutro Abs: 5.4 K/uL (ref 1.7–7.7)
Neutrophils Relative %: 70 %
Platelet Count: 293 K/uL (ref 150–400)
RBC: 3.8 MIL/uL — ABNORMAL LOW (ref 4.22–5.81)
RDW: 12 % (ref 11.5–15.5)
WBC Count: 7.5 K/uL (ref 4.0–10.5)
nRBC: 0 % (ref 0.0–0.2)

## 2023-01-30 LAB — IRON AND IRON BINDING CAPACITY (CC-WL,HP ONLY)
Iron: 70 ug/dL (ref 45–182)
Saturation Ratios: 26 % (ref 17.9–39.5)
TIBC: 266 ug/dL (ref 250–450)
UIBC: 196 ug/dL (ref 117–376)

## 2023-01-30 LAB — FERRITIN: Ferritin: 217 ng/mL (ref 24–336)

## 2023-01-31 ENCOUNTER — Telehealth: Payer: Self-pay

## 2023-01-31 NOTE — Telephone Encounter (Signed)
LM for pt with lab results and recommendations. 

## 2023-01-31 NOTE — Telephone Encounter (Signed)
-----   Message from Pollyann Samples sent at 01/31/2023  8:21 AM EST ----- Please let pt know iron levels are hgb are normal, no need for IV iron at this time. Please continue oral iron with vit C source and keep f/up's as scheduled.   Thanks, Clayborn Heron NP

## 2023-03-27 DIAGNOSIS — K080 Exfoliation of teeth due to systemic causes: Secondary | ICD-10-CM | POA: Diagnosis not present

## 2023-04-14 DIAGNOSIS — K080 Exfoliation of teeth due to systemic causes: Secondary | ICD-10-CM | POA: Diagnosis not present

## 2023-05-01 ENCOUNTER — Other Ambulatory Visit: Payer: Self-pay

## 2023-05-01 ENCOUNTER — Inpatient Hospital Stay: Payer: Medicare Other | Attending: Hematology and Oncology

## 2023-05-01 ENCOUNTER — Inpatient Hospital Stay: Payer: Medicare Other | Admitting: Hematology and Oncology

## 2023-05-01 VITALS — BP 162/77 | HR 67 | Temp 98.3°F | Resp 14 | Wt 137.3 lb

## 2023-05-01 DIAGNOSIS — D500 Iron deficiency anemia secondary to blood loss (chronic): Secondary | ICD-10-CM

## 2023-05-01 DIAGNOSIS — Z87891 Personal history of nicotine dependence: Secondary | ICD-10-CM | POA: Diagnosis not present

## 2023-05-01 DIAGNOSIS — D509 Iron deficiency anemia, unspecified: Secondary | ICD-10-CM | POA: Diagnosis not present

## 2023-05-01 LAB — CBC WITH DIFFERENTIAL (CANCER CENTER ONLY)
Abs Immature Granulocytes: 0.02 10*3/uL (ref 0.00–0.07)
Basophils Absolute: 0.1 10*3/uL (ref 0.0–0.1)
Basophils Relative: 1 %
Eosinophils Absolute: 0.2 10*3/uL (ref 0.0–0.5)
Eosinophils Relative: 2 %
HCT: 40.5 % (ref 39.0–52.0)
Hemoglobin: 13.2 g/dL (ref 13.0–17.0)
Immature Granulocytes: 0 %
Lymphocytes Relative: 16 %
Lymphs Abs: 1.4 10*3/uL (ref 0.7–4.0)
MCH: 33.1 pg (ref 26.0–34.0)
MCHC: 32.6 g/dL (ref 30.0–36.0)
MCV: 101.5 fL — ABNORMAL HIGH (ref 80.0–100.0)
Monocytes Absolute: 0.5 10*3/uL (ref 0.1–1.0)
Monocytes Relative: 6 %
Neutro Abs: 6.6 10*3/uL (ref 1.7–7.7)
Neutrophils Relative %: 75 %
Platelet Count: 301 10*3/uL (ref 150–400)
RBC: 3.99 MIL/uL — ABNORMAL LOW (ref 4.22–5.81)
RDW: 12.2 % (ref 11.5–15.5)
WBC Count: 8.7 10*3/uL (ref 4.0–10.5)
nRBC: 0 % (ref 0.0–0.2)

## 2023-05-01 LAB — IRON AND IRON BINDING CAPACITY (CC-WL,HP ONLY)
Iron: 84 ug/dL (ref 45–182)
Saturation Ratios: 27 % (ref 17.9–39.5)
TIBC: 309 ug/dL (ref 250–450)
UIBC: 225 ug/dL (ref 117–376)

## 2023-05-01 LAB — FERRITIN: Ferritin: 201 ng/mL (ref 24–336)

## 2023-05-01 NOTE — Progress Notes (Signed)
Jeremy Holmes   [phone]  PROGRESS NOTE  Patient Care Team: Jeremy Screws, Jeremy Holmes as PCP - General (Family Medicine)  Hematological/Oncological History # Iron Deficiency Anemia of Unclear Etiology 01/14/2022: WBC 8.7, Hgb 12.3, MCV 94.8, Plt 334 02/23/2022: establish care with Dr. Leonides Schanz  09/12/22-09/19/22: Received IV feraheme 510 mg x 2 doses  Interval History:  Jeremy Holmes 89 y.o. male with medical history significant for iron deficiency anemia who presents for a follow up Holmes. The patient's last Holmes was on 11/25/2022. In the interim since the last Holmes, he has had no major changes in his health. He is unaccompanied for this Holmes.   On exam today Jeremy Holmes.  He reports he is had no recent illnesses such as runny nose, sore throat, cough.  Unfortunately his wife did have a fall.  He reports he does have "cold hands".  He notes his energy levels have been "fair".  He notes his energy is about a 6 or 7 out of 10.  He reports his appetite has been good.  He notes that he is taking his iron pills with no difficulty.  Is causing some dark stools but no nausea, or constipation.  He reports he is had no issues with lightheadedness, dizziness, or shortness of breath.  Overall he feels well and has no questions concerns or complaints. He denies fevers, chills, sweats, shortness of breath, chest pain or cough.  A full 10 point ROS otherwise negative.  MEDICAL HISTORY:  Past Medical History:  Diagnosis Date   Anxiety    no meds   Arthritis    right knee   Depression    no meds   HLD (hyperlipidemia)    Pre-diabetes    diet controlled - no meds    SURGICAL HISTORY: Past Surgical History:  Procedure Laterality Date   COLONOSCOPY     ELBOW SURGERY Right    EYE SURGERY Bilateral    cataracts surgery   INGUINAL HERNIA REPAIR Right 04/27/2021   Procedure: OPEN REPAIR  RIGHT INGUINAL HERNIA WITH MESH;  Surgeon: Darnell Level, Jeremy Holmes;  Location: [facility];  Service: General;  Laterality: Right;   KNEE ARTHROSCOPY Right 08/20/2020   Procedure: ARTHROSCOPY KNEE AND IRRIGATION AND DEBRIDEMENT;  Surgeon: Tarry Kos, Jeremy Holmes;  Location: [facility] OR;  Service: Orthopedics;  Laterality: Right;   SHOULDER SURGERY Bilateral    x 2   WISDOM TOOTH EXTRACTION      SOCIAL HISTORY: Social History   Socioeconomic History   Marital status: Married    Spouse name: Not on file   Number of children: Not on file   Years of education: Not on file   Highest education level: Not on file  Occupational History   Not on file  Tobacco Use   Smoking status: Former    Types: Cigarettes   Smokeless tobacco: Never  Vaping Use   Vaping status: Never Used  Substance and Sexual Activity   Alcohol use: Yes    Alcohol/week: 7.0 standard drinks of alcohol    Types: 7 Glasses of wine per week    Comment: occ at night with supper   Drug use: No   Sexual activity: Not on file  Other Topics Concern   Not on file  Social History Narrative   Not on file   Social Drivers of Health   Financial Resource Strain: Not on file  Food Insecurity: Not on file  Transportation Needs: Not on file  Physical Activity: Not on file  Stress: Not on file  Social Connections: Not on file  Intimate Partner Violence: Not on file    FAMILY HISTORY: No family history on file.  ALLERGIES:  has no known allergies.  MEDICATIONS:  Current Outpatient Medications  Medication Sig Dispense Refill   acetaminophen (TYLENOL) 500 MG tablet Take 1,000 mg by mouth every 6 (six) hours as needed for moderate pain.     Ascorbic Acid (VITAMIN C WITH ROSE HIPS) 1000 MG tablet Take 1,000 mg by mouth daily.     aspirin 81 MG chewable tablet Chew 81 mg by mouth daily.     atorvastatin (LIPITOR) 20 MG tablet Take 20 mg by mouth daily.     CINNAMON PO Take 1,000 mg by mouth daily.     ferrous sulfate 325 (65  FE) MG EC tablet Take 325 mg by mouth daily with breakfast.     Multiple Vitamins-Minerals (CENTRUM SILVER PO) Take 1 tablet by mouth daily.     Omega-3 Fatty Acids (FISH OIL) 1200 MG CAPS Take 1 capsule by mouth daily.     No current facility-administered medications for this Holmes.    REVIEW OF SYSTEMS:   Constitutional: ( - ) fevers, ( - )  chills , ( - ) night sweats Eyes: ( - ) blurriness of vision, ( - ) double vision, ( - ) watery eyes Ears, nose, mouth, throat, and face: ( - ) mucositis, ( - ) sore throat Respiratory: ( - ) cough, ( - ) dyspnea, ( - ) wheezes Cardiovascular: ( - ) palpitation, ( - ) chest discomfort, ( - ) lower extremity swelling Gastrointestinal:  ( - ) nausea, ( - ) heartburn, ( - ) change in bowel habits Skin: ( - ) abnormal skin rashes Lymphatics: ( - ) new lymphadenopathy, ( - ) easy bruising Neurological: ( - ) numbness, ( - ) tingling, ( - ) new weaknesses Behavioral/Psych: ( - ) mood change, ( - ) new changes  All other systems were reviewed with the patient and are negative.  PHYSICAL EXAMINATION:  Vitals:   05/01/23 1504  BP: (!) 162/77  Pulse: 67  Resp: 14  Temp: 98.3 F (36.8 C)  SpO2: 100%     Filed Weights   05/01/23 1504  Weight: 137 lb 4.8 oz (62.3 kg)    GENERAL: Elderly appearing Caucasian male, alert, no distress and comfortable SKIN: skin color, texture, turgor are normal, no rashes or significant lesions EYES: conjunctiva are pink and non-injected, sclera clear LUNGS: clear to auscultation and percussion with normal breathing effort HEART: regular rate & rhythm and no murmurs and no lower extremity edema Musculoskeletal: no cyanosis of digits and no clubbing  PSYCH: alert & oriented x 3, fluent speech NEURO: no focal motor/sensory deficits  LABORATORY DATA:  I have reviewed the data as listed    Latest Ref Rng & Units 05/01/2023    2:23 PM 01/30/2023   11:44 AM 11/18/2022    1:01 PM  CBC  WBC 4.0 - 10.5 K/uL 8.7  7.5   7.3   Hemoglobin 13.0 - 17.0 g/dL 60.4  54.0  98.1   Hematocrit 39.0 - 52.0 % 40.5  38.4  36.0   Platelets 150 - 400 K/uL 301  293  286        Latest Ref Rng & Units 11/18/2022    1:01 PM 08/18/2022  1:39 PM 05/25/2022    9:48 AM  CMP  Glucose 70 - 99 mg/dL 161  096    045  409   BUN 8 - 23 mg/dL 18  22    21  17    Creatinine 0.61 - 1.24 mg/dL 8.11  9.14    7.82  9.56   Sodium 135 - 145 mmol/L 139  141    141  138   Potassium 3.5 - 5.1 mmol/L 4.3  4.3    4.2  4.2   Chloride 98 - 111 mmol/L 101  102    103  102   CO2 22 - 32 mmol/L 33  30    33  30   Calcium 8.9 - 10.3 mg/dL 9.6  9.2    9.2  8.8   Total Protein 6.5 - 8.1 g/dL 7.2  7.0  6.5   Total Bilirubin 0.3 - 1.2 mg/dL 0.4  0.4  0.4   Alkaline Phos 38 - 126 U/L 59  56  61   AST 15 - 41 U/L 17  15  16    ALT 0 - 44 U/L 17  12  14      RADIOGRAPHIC STUDIES: No results found.  ASSESSMENT & PLAN Jeremy Holmes is a 89 y.o. male with medical history significant for iron deficiency anemia who presents for a follow up Holmes.   # Iron Deficiency Anemia 2/2 Unclear Etiology, Suspect due to GI Bleeding -- Findings are consistent with iron deficiency anemia secondary GI bleeding.  --Encouraged her to follow-up with GI for further evaluation.  He currently follows with [facility].  Patient notes he does not wish to undergo endoscopic evaluation. --Received IV feraheme 510 mg x 2 doses from 09/12/2022-09/19/2022.  --Labs from today show WBC 8.7, Hgb 13.2, MCV 101.5, Plt 301  --No need for additional IV iron at this time.  --Continue ferrous sulfate 325 mg daily with a source of vitamin C --RTC in 6 months for labs/follow up.  No orders of the defined types were placed in this encounter.   All questions were answered. The patient knows to call the clinic with any problems, questions or concerns.  I have spent a total of 25 minutes minutes of face-to-face and non-face-to-face time, preparing to see the patient,  performing a  medically appropriate examination, counseling and educating the patient, documenting clinical information in the electronic health record, independently interpreting results and communicating results to the patient, and care coordination.   Jeremy Barns, Jeremy Holmes Department of Hematology/Oncology [facility] Phone: [phone] Pager: [phone] Email: [email or web address]    05/01/2023 3:05 PM

## 2023-06-12 DIAGNOSIS — Z961 Presence of intraocular lens: Secondary | ICD-10-CM | POA: Diagnosis not present

## 2023-06-23 DIAGNOSIS — L989 Disorder of the skin and subcutaneous tissue, unspecified: Secondary | ICD-10-CM | POA: Diagnosis not present

## 2023-07-18 ENCOUNTER — Emergency Department (HOSPITAL_COMMUNITY)
Admission: EM | Admit: 2023-07-18 | Discharge: 2023-07-18 | Disposition: A | Discharge status: Home / Self Care | Attending: Emergency Medicine | Admitting: Emergency Medicine

## 2023-07-18 ENCOUNTER — Other Ambulatory Visit: Payer: Self-pay

## 2023-07-18 ENCOUNTER — Emergency Department (HOSPITAL_COMMUNITY)

## 2023-07-18 DIAGNOSIS — K5730 Diverticulosis of large intestine without perforation or abscess without bleeding: Secondary | ICD-10-CM | POA: Diagnosis not present

## 2023-07-18 DIAGNOSIS — N329 Bladder disorder, unspecified: Secondary | ICD-10-CM | POA: Insufficient documentation

## 2023-07-18 DIAGNOSIS — R1032 Left lower quadrant pain: Secondary | ICD-10-CM | POA: Diagnosis not present

## 2023-07-18 DIAGNOSIS — K436 Other and unspecified ventral hernia with obstruction, without gangrene: Secondary | ICD-10-CM | POA: Diagnosis not present

## 2023-07-18 DIAGNOSIS — N3289 Other specified disorders of bladder: Secondary | ICD-10-CM | POA: Diagnosis not present

## 2023-07-18 DIAGNOSIS — R109 Unspecified abdominal pain: Secondary | ICD-10-CM | POA: Diagnosis not present

## 2023-07-18 DIAGNOSIS — Z7982 Long term (current) use of aspirin: Secondary | ICD-10-CM | POA: Insufficient documentation

## 2023-07-18 DIAGNOSIS — K439 Ventral hernia without obstruction or gangrene: Secondary | ICD-10-CM | POA: Diagnosis not present

## 2023-07-18 DIAGNOSIS — K4020 Bilateral inguinal hernia, without obstruction or gangrene, not specified as recurrent: Secondary | ICD-10-CM | POA: Diagnosis not present

## 2023-07-18 LAB — CBC WITH DIFFERENTIAL/PLATELET
Abs Immature Granulocytes: 0.04 10*3/uL (ref 0.00–0.07)
Basophils Absolute: 0.1 10*3/uL (ref 0.0–0.1)
Basophils Relative: 1 %
Eosinophils Absolute: 0.1 10*3/uL (ref 0.0–0.5)
Eosinophils Relative: 1 %
HCT: 40.2 % (ref 39.0–52.0)
Hemoglobin: 13.4 g/dL (ref 13.0–17.0)
Immature Granulocytes: 1 %
Lymphocytes Relative: 14 %
Lymphs Abs: 1.1 10*3/uL (ref 0.7–4.0)
MCH: 33.4 pg (ref 26.0–34.0)
MCHC: 33.3 g/dL (ref 30.0–36.0)
MCV: 100.2 fL — ABNORMAL HIGH (ref 80.0–100.0)
Monocytes Absolute: 0.6 10*3/uL (ref 0.1–1.0)
Monocytes Relative: 8 %
Neutro Abs: 5.8 10*3/uL (ref 1.7–7.7)
Neutrophils Relative %: 75 %
Platelets: 245 10*3/uL (ref 150–400)
RBC: 4.01 MIL/uL — ABNORMAL LOW (ref 4.22–5.81)
RDW: 12.4 % (ref 11.5–15.5)
WBC: 7.7 10*3/uL (ref 4.0–10.5)
nRBC: 0 % (ref 0.0–0.2)

## 2023-07-18 LAB — COMPREHENSIVE METABOLIC PANEL WITH GFR
ALT: 16 U/L (ref 0–44)
AST: 20 U/L (ref 15–41)
Albumin: 3.8 g/dL (ref 3.5–5.0)
Alkaline Phosphatase: 55 U/L (ref 38–126)
Anion gap: 9 (ref 5–15)
BUN: 20 mg/dL (ref 8–23)
CO2: 26 mmol/L (ref 22–32)
Calcium: 9 mg/dL (ref 8.9–10.3)
Chloride: 104 mmol/L (ref 98–111)
Creatinine, Ser: 0.77 mg/dL (ref 0.61–1.24)
GFR, Estimated: >60 mL/min (ref >60)
Glucose, Bld: 142 mg/dL — ABNORMAL HIGH (ref 70–99)
Potassium: 3.8 mmol/L (ref 3.5–5.1)
Sodium: 139 mmol/L (ref 135–145)
Total Bilirubin: 0.7 mg/dL (ref 0.0–1.2)
Total Protein: 6.8 g/dL (ref 6.5–8.1)

## 2023-07-18 LAB — LIPASE, BLOOD: Lipase: 82 U/L — ABNORMAL HIGH (ref 11–51)

## 2023-07-18 LAB — LACTIC ACID, PLASMA: Lactic Acid, Venous: 1.5 mmol/L (ref 0.5–1.9)

## 2023-07-18 MED ORDER — IOHEXOL 300 MG/ML  SOLN
100.0000 mL | Freq: Once | INTRAMUSCULAR | Status: AC | PRN
  Administered 2023-07-18: 100 mL via INTRAVENOUS

## 2023-07-18 NOTE — ED Provider Notes (Signed)
 [place] EMERGENCY DEPARTMENT AT [facility] Provider Note   CSN: [id number] Arrival date & time: 07/18/23  4098     History  Chief Complaint  Patient presents with   Abdominal Pain    Left side mass    Jeremy Holmes is a 89 y.o. male.  89 year old male with a history of diverticulitis, umbilical hernia, and a right sided inguinal hernia repair who presents to the emergency department with left-sided abdominal pain.  Patient reports that he got up this morning around 4 AM.  Was doing well until he started experiencing some left-sided abdominal pain.  Says that it gradually worsened and so he decided to come into the emergency department for evaluation.  Denies nausea or vomiting.  No fevers.  No diarrhea.       Home Medications Prior to Admission medications   Medication Sig Start Date End Date Taking? Authorizing Provider  acetaminophen  (TYLENOL ) 500 MG tablet Take 1,000 mg by mouth every 6 (six) hours as needed for moderate pain.    [provider]  Ascorbic Acid (VITAMIN C WITH Jeremy HIPS) 1000 MG tablet Take 1,000 mg by mouth daily.    [provider]  aspirin 81 MG chewable tablet Chew 81 mg by mouth daily.    [provider]  atorvastatin (LIPITOR) 20 MG tablet Take 20 mg by mouth daily.    [provider]  CINNAMON PO Take 1,000 mg by mouth daily.    [provider]  ferrous sulfate 325 (65 FE) MG EC tablet Take 325 mg by mouth daily with breakfast.    [provider]  Multiple Vitamins-Minerals (CENTRUM SILVER PO) Take 1 tablet by mouth daily.    [provider]  Omega-3 Fatty Acids (FISH OIL) 1200 MG CAPS Take 1 capsule by mouth daily.    [provider]      Allergies    Patient has no known allergies.    Review of Systems   Review of Systems  Physical Exam Updated Vital Signs BP (!) 162/63 (BP Location: Right Arm)   Pulse 72   Temp (!) 97.5 F (36.4 C) (Oral)   Resp 18    Ht 5\' 7"  (1.702 m)   Wt 63.5 kg   SpO2 98%   BMI 21.93 kg/m  Physical Exam Vitals and nursing note reviewed.  Constitutional:      General: He is not in acute distress.    Appearance: He is well-developed.    HENT:     Head: Normocephalic and atraumatic.     Right Ear: External ear normal.     Left Ear: External ear normal.     Nose: Nose normal.  Eyes:     Extraocular Movements: Extraocular movements intact.     Conjunctiva/sclera: Conjunctivae normal.     Pupils: Pupils are equal, round, and reactive to light.  Pulmonary:     Effort: Pulmonary effort is normal. No respiratory distress.  Abdominal:     General: There is no distension.     Palpations: Abdomen is soft. There is mass.     Tenderness: There is abdominal tenderness (Left mid abdomen.  Mass palpated concerning for hernia that was able to be reduced.  No overlying skin changes.). There is no guarding.  Musculoskeletal:     Cervical back: Normal range of motion and neck supple.  Skin:    General: Skin is warm and dry.  Neurological:     Mental Status: He is alert. Mental  status is at baseline.  Psychiatric:        Mood and Affect: Mood normal.        Behavior: Behavior normal.     ED Results / Procedures / Treatments   Labs (all labs ordered are listed, but only abnormal results are displayed) Labs Reviewed  COMPREHENSIVE METABOLIC PANEL WITH GFR - Abnormal; Notable for the following components:      Result Value   Glucose, Bld 142 (*)    All other components within normal limits  LIPASE, BLOOD - Abnormal; Notable for the following components:   Lipase 82 (*)    All other components within normal limits  CBC WITH DIFFERENTIAL/PLATELET - Abnormal; Notable for the following components:   RBC 4.01 (*)    MCV 100.2 (*)    All other components within normal limits  LACTIC ACID, PLASMA    EKG None  Radiology CT ABDOMEN PELVIS W CONTRAST Result Date: 07/18/2023 CLINICAL DATA:  Left lower quadrant  abdominal pain for evaluation of hernia. * Tracking Code: BO * EXAM: CT ABDOMEN AND PELVIS WITH CONTRAST TECHNIQUE: Multidetector CT imaging of the abdomen and pelvis was performed using the standard protocol following bolus administration of intravenous contrast. RADIATION DOSE REDUCTION: This exam was performed according to the departmental dose-optimization program which includes automated exposure control, adjustment of the mA and/or kV according to patient size and/or use of iterative reconstruction technique. CONTRAST:  OMNIPAQUE  IOHEXOL  300 MG/ML  SOLN COMPARISON:  CT abdomen and pelvis dated 12/27/2016 FINDINGS: Lower chest: No focal consolidation or pulmonary nodule in the lung bases. No pleural effusion or pneumothorax demonstrated. Partially imaged heart size is normal. Hepatobiliary: No focal hepatic lesions. No intra or extrahepatic biliary ductal dilation. Normal gallbladder. Pancreas: No focal lesions or main ductal dilation. Spleen: Normal in size without focal abnormality. Adrenals/Urinary Tract: No adrenal nodules. No suspicious renal mass, calculi or hydronephrosis. 2.1 x 1.3 cm enhancing irregular focus in the posterior midline bladder base (2:66). Stomach/Bowel: Normal appearance of the stomach. No evidence of bowel wall thickening, distention, or inflammatory changes. Colonic diverticulosis without acute diverticulitis. Normal appendix. Vascular/Lymphatic: Aortic atherosclerosis. No enlarged abdominal or pelvic lymph nodes. Reproductive: Prostate is unremarkable. Other: No free fluid, fluid collection, or free air. Musculoskeletal: No acute or abnormal lytic or blastic osseous lesions. Small fat-containing paraumbilical and bilateral inguinal hernias. Multilevel degenerative changes of the partially imaged thoracic and lumbar spine. IMPRESSION: 1. A 2.1 x 1.3 cm enhancing irregular focus in the posterior midline bladder base, suspicious for urothelial neoplasm. Recommend cystoscopy for  further evaluation. 2. Small fat-containing paraumbilical and bilateral inguinal hernias. 3. Colonic diverticulosis without acute diverticulitis. 4.  Aortic Atherosclerosis (ICD10-I70.0). Electronically Signed   By: Limin  Xu M.D.   On: 07/18/2023 11:50    Procedures Hernia reduction  Date/Time: 07/18/2023 6:09 PM  Performed by: Ninetta Basket, MD Authorized by: Ninetta Basket, MD  Consent: Verbal consent obtained. Consent given by: patient Patient identity confirmed: verbally with patient Local anesthesia used: no  Anesthesia: Local anesthesia used: no  Sedation: Patient sedated: no  Patient tolerance: patient tolerated the procedure well with no immediate complications       Medications Ordered in ED Medications  iohexol  (OMNIPAQUE ) 300 MG/ML solution 100 mL (100 mLs Intravenous Contrast Given 07/18/23 1004)    ED Course/ Medical Decision Making/ A&P  Medical Decision Making Amount and/or Complexity of Data Reviewed Labs: ordered. Radiology: ordered.  Risk Prescription drug management.   Jeremy Holmes is a 89 y.o. male with comorbidities that complicate the patient evaluation including diverticulitis, umbilical hernia, and a right sided inguinal hernia repair who presents to the emergency department with left-sided abdominal pain.    Initial Ddx:  Spigelian hernia, incarcerated hernia, strangulated hernia, diverticulitis  MDM/Course:  Patient presents emergency department with left mid abdomen pain.  Does have palpable mass on exam.  I was able to apply gradual steady pressure and it was reduced without difficulty.  Did not have any overlying skin changes to suggest that it was strangulated hernia.  Patient had blood work that was sent that was unremarkable.  On the CT scan did not have any acute findings such as diverticulitis or any residual hernia in that location.  Did have bilateral inguinal hernias which the patient is  aware of but he is not having any pain in those locations right now.  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  Will need to follow-up with urology about this.  Will have him follow-up with general surgery regarding his spigelian hernia as well.  His surgeon Dr Sofia Dunn was messaged via epic regarding an appointment. Did repeat his abdominal exam prior to discharge and was not having any rebound or guarding or new pain.  This patient presents to the ED for concern of complaints listed in HPI, this involves an extensive number of treatment options, and is a complaint that carries with it a high risk of complications and morbidity. Disposition including potential need for admission considered.   Dispo: DC Home. Return precautions discussed including, but not limited to, those listed in the AVS. Allowed pt time to ask questions which were answered fully prior to dc.  Records reviewed Outpatient Clinic Notes The following labs were independently interpreted: Chemistry and show no acute abnormality I independently reviewed the following imaging with scope of interpretation limited to determining acute life threatening conditions related to emergency care: CT Abdomen/Pelvis and agree with the radiologist interpretation with the following exceptions: none I have reviewed the patients home medications and made adjustments as needed Social Determinants of health:  Geriatric  Portions of this note were generated with Scientist, clinical (histocompatibility and immunogenetics). Dictation errors may occur despite best attempts at proofreading.     Final Clinical Impression(s) / ED Diagnoses Final diagnoses:  Spigelian hernia  Bladder mass    Rx / DC Orders ED Discharge Orders     None         Ninetta Basket, MD 07/18/23 1810

## 2023-07-18 NOTE — Discharge Instructions (Signed)
 You were seen for your hernia in the emergency department.   Follow-up with your primary doctor in 2-3 days regarding your visit.  Follow-up with surgery in 1 to 2 weeks regarding your hernia to see if it needs to be repaired.  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  Return immediately to the emergency department if you experience any of the following: Severe abdominal pain, nausea or vomiting, fevers, or any other concerning symptoms.    Thank you for visiting our Emergency Department. It was a pleasure taking care of you today.

## 2023-07-18 NOTE — ED Triage Notes (Signed)
 Pt brought to the ED from home for lower left abdominal pain. Firm mass noted and tender to palpation. Pt denies any falls or trauma. Pt states that he did not notice the mass until he drank his coffee this morning. Pt states that he has a history of hernia to right side of abdomen but, the pain is more severe.

## 2023-08-10 DIAGNOSIS — D414 Neoplasm of uncertain behavior of bladder: Secondary | ICD-10-CM | POA: Diagnosis not present

## 2023-08-22 ENCOUNTER — Other Ambulatory Visit: Payer: Self-pay | Admitting: Urology

## 2023-08-28 NOTE — Patient Instructions (Signed)
 SURGICAL WAITING ROOM VISITATION  Patients having surgery or a procedure may have no more than 2 support people in the waiting area - these visitors may rotate.    Children under the age of 97 must have an adult with them who is not the patient.  Due to an increase in RSV and influenza rates and associated hospitalizations, children ages 63 and under may not visit patients in [facility] hospitals.  Visitors with respiratory illnesses are discouraged from visiting and should remain at home.  If the patient needs to stay at the hospital during part of their recovery, the visitor guidelines for inpatient rooms apply. Pre-op nurse will coordinate an appropriate time for 1 support person to accompany patient in pre-op.  This support person may not rotate.    Please refer to the [facility] website for the visitor guidelines for Inpatients (after your surgery is over and you are in a regular room).       Your procedure is scheduled on: 08/30/23   Report to [facility] Main Entrance    Report to admitting at : 11:15 AM   Call this number if you have problems the morning of surgery [phone]   Do not eat food :After Midnight.   After Midnight you may have the following liquids until : 10:00 AM DAY OF SURGERY  Water Non-Citrus Juices (without pulp, NO RED-Apple, White grape, White cranberry) Black Coffee (NO MILK/CREAM OR CREAMERS, sugar ok)  Clear Tea (NO MILK/CREAM OR CREAMERS, sugar ok) regular and decaf                             Plain Jell-O (NO RED)                                           Fruit ices (not with fruit pulp, NO RED)                                     Popsicles (NO RED)                                                               Sports drinks like Gatorade (NO RED)              FOLLOW ANY ADDITIONAL PRE OP INSTRUCTIONS YOU RECEIVED FROM YOUR SURGEON'S OFFICE!!!   Oral Hygiene is also important to reduce your risk of infection.                                     Remember - BRUSH YOUR TEETH THE MORNING OF SURGERY WITH YOUR REGULAR TOOTHPASTE  DENTURES WILL BE REMOVED PRIOR TO SURGERY PLEASE DO NOT APPLY "Poly grip" OR ADHESIVES!!!   Do NOT smoke after Midnight   Stop all vitamins and herbal supplements 7 days before surgery.   Take these medicines the morning of surgery with A SIP OF WATER: NONE> Tylenol  as needed.  You may not have any metal on your body including hair pins, jewelry, and body piercing             Do not wear lotions, powders, perfumes/cologne, or deodorant              Men may shave face and neck.   Do not bring valuables to the hospital. [facility] IS NOT             RESPONSIBLE   FOR VALUABLES.   Contacts, glasses, dentures or bridgework may not be worn into surgery.   Bring small overnight bag day of surgery.   DO NOT BRING YOUR HOME MEDICATIONS TO THE HOSPITAL. PHARMACY WILL DISPENSE MEDICATIONS LISTED ON YOUR MEDICATION LIST TO YOU DURING YOUR ADMISSION IN THE HOSPITAL!    Patients discharged on the day of surgery will not be allowed to drive home.  Someone NEEDS to stay with you for the first 24 hours after anesthesia.   Special Instructions: Bring a copy of your healthcare power of attorney and living will documents the day of surgery if you haven't scanned them before.              Please read over the following fact sheets you were given: IF YOU HAVE QUESTIONS ABOUT YOUR PRE-OP INSTRUCTIONS PLEASE CALL [phone]   If you received a COVID test during your pre-op visit  it is requested that you wear a mask when out in public, stay away from anyone that may not be feeling well and notify your surgeon if you develop symptoms. If you test positive for Covid or have been in contact with anyone that has tested positive in the last 10 days please notify you surgeon.    [facility] - Preparing for Surgery Before surgery, you can play an important role.  Because skin is not sterile, your skin  needs to be as free of germs as possible.  You can reduce the number of germs on your skin by washing with CHG (chlorahexidine gluconate) soap before surgery.  CHG is an antiseptic cleaner which kills germs and bonds with the skin to continue killing germs even after washing. Please DO NOT use if you have an allergy to CHG or antibacterial soaps.  If your skin becomes reddened/irritated stop using the CHG and inform your nurse when you arrive at Short Stay. Do not shave (including legs and underarms) for at least 48 hours prior to the first CHG shower.  You may shave your face/neck. Please follow these instructions carefully:  1.  Shower with CHG Soap the night before surgery and the  morning of Surgery.  2.  If you choose to wash your hair, wash your hair first as usual with your  normal  shampoo.  3.  After you shampoo, rinse your hair and body thoroughly to remove the  shampoo.                           4.  Use CHG as you would any other liquid soap.  You can apply chg directly  to the skin and wash                       Gently with a scrungie or clean washcloth.  5.  Apply the CHG Soap to your body ONLY FROM THE NECK DOWN.   Do not use on face/ open  Wound or open sores. Avoid contact with eyes, ears mouth and genitals (private parts).                       Wash face,  Genitals (private parts) with your normal soap.             6.  Wash thoroughly, paying special attention to the area where your surgery  will be performed.  7.  Thoroughly rinse your body with warm water from the neck down.  8.  DO NOT shower/wash with your normal soap after using and rinsing off  the CHG Soap.                9.  Pat yourself dry with a clean towel.            10.  Wear clean pajamas.            11.  Place clean sheets on your bed the night of your first shower and do not  sleep with pets. Day of Surgery : Do not apply any lotions/deodorants the morning of surgery.  Please wear clean  clothes to the hospital/surgery center.  FAILURE TO FOLLOW THESE INSTRUCTIONS MAY RESULT IN THE CANCELLATION OF YOUR SURGERY PATIENT SIGNATURE_________________________________  NURSE SIGNATURE__________________________________  ________________________________________________________________________

## 2023-08-29 ENCOUNTER — Encounter (HOSPITAL_COMMUNITY)
Admission: RE | Admit: 2023-08-29 | Discharge: 2023-08-29 | Disposition: A | Discharge status: Home / Self Care | Source: Ambulatory Visit | Attending: Urology | Admitting: Urology

## 2023-08-29 ENCOUNTER — Encounter (HOSPITAL_COMMUNITY): Payer: Self-pay

## 2023-08-29 ENCOUNTER — Other Ambulatory Visit: Payer: Self-pay

## 2023-08-29 VITALS — BP 158/71 | HR 68 | Temp 97.9°F | Ht 67.0 in | Wt 133.0 lb

## 2023-08-29 DIAGNOSIS — Z01818 Encounter for other preprocedural examination: Secondary | ICD-10-CM | POA: Insufficient documentation

## 2023-08-29 HISTORY — DX: Pneumonia, unspecified organism: J18.9

## 2023-08-29 HISTORY — DX: Malignant (primary) neoplasm, unspecified: C80.1

## 2023-08-29 HISTORY — DX: Anemia, unspecified: D64.9

## 2023-08-29 LAB — BASIC METABOLIC PANEL WITH GFR
Anion gap: 8 (ref 5–15)
BUN: 18 mg/dL (ref 8–23)
CO2: 29 mmol/L (ref 22–32)
Calcium: 9.4 mg/dL (ref 8.9–10.3)
Chloride: 100 mmol/L (ref 98–111)
Creatinine, Ser: 0.91 mg/dL (ref 0.61–1.24)
GFR, Estimated: >60 mL/min (ref >60)
Glucose, Bld: 138 mg/dL — ABNORMAL HIGH (ref 70–99)
Potassium: 4.4 mmol/L (ref 3.5–5.1)
Sodium: 137 mmol/L (ref 135–145)

## 2023-08-29 LAB — CBC
HCT: 40.9 % (ref 39.0–52.0)
Hemoglobin: 13 g/dL (ref 13.0–17.0)
MCH: 32.7 pg (ref 26.0–34.0)
MCHC: 31.8 g/dL (ref 30.0–36.0)
MCV: 103 fL — ABNORMAL HIGH (ref 80.0–100.0)
Platelets: 289 10*3/uL (ref 150–400)
RBC: 3.97 MIL/uL — ABNORMAL LOW (ref 4.22–5.81)
RDW: 12.6 % (ref 11.5–15.5)
WBC: 9.2 10*3/uL (ref 4.0–10.5)
nRBC: 0 % (ref 0.0–0.2)

## 2023-08-29 NOTE — Progress Notes (Signed)
 For Anesthesia: PCP - Ruven Coy, MD  Cardiologist - N/A  Bowel Prep reminder:  Chest x-ray -  EKG -  Stress Test -  ECHO -  Cardiac Cath -  Pacemaker/ICD device last checked: Pacemaker orders received: Device Rep notified:  Spinal Cord Stimulator:N/A  Sleep Study -N/A  CPAP -   Fasting Blood Sugar - N/A Checks Blood Sugar _____ times a day Date and result of last Hgb A1c-  Last dose of GLP1 agonist- N/A GLP1 instructions:   Last dose of SGLT-2 inhibitors- N/A SGLT-2 instructions:   Blood Thinner Instructions: Aspirin Instructions: Last Dose:  Activity level: Can go up a flight of stairs and activities of daily living without stopping and without chest pain and/or shortness of breath   Able to exercise without chest pain and/or shortness of breath     Anesthesia review: Hx: Pre-DIA  Patient denies shortness of breath, fever, cough and chest pain at PAT appointment   Patient verbalized understanding of instructions that were given to them at the PAT appointment. Patient was also instructed that they will need to review over the PAT instructions again at home before surgery.

## 2023-08-30 ENCOUNTER — Encounter (HOSPITAL_COMMUNITY): Payer: Self-pay | Admitting: Urology

## 2023-08-30 ENCOUNTER — Encounter (HOSPITAL_COMMUNITY)
Admission: RE | Disposition: A | Discharge status: Home / Self Care | Payer: Self-pay | Source: Home / Self Care | Attending: Urology

## 2023-08-30 ENCOUNTER — Observation Stay (HOSPITAL_COMMUNITY)
Admission: RE | Admit: 2023-08-30 | Discharge: 2023-09-01 | Disposition: A | Discharge status: Home / Self Care | Attending: Urology | Admitting: Urology

## 2023-08-30 ENCOUNTER — Ambulatory Visit (HOSPITAL_COMMUNITY): Admitting: Anesthesiology

## 2023-08-30 ENCOUNTER — Ambulatory Visit (HOSPITAL_COMMUNITY)

## 2023-08-30 DIAGNOSIS — D414 Neoplasm of uncertain behavior of bladder: Secondary | ICD-10-CM | POA: Diagnosis not present

## 2023-08-30 DIAGNOSIS — C679 Malignant neoplasm of bladder, unspecified: Principal | ICD-10-CM | POA: Diagnosis present

## 2023-08-30 DIAGNOSIS — Z85828 Personal history of other malignant neoplasm of skin: Secondary | ICD-10-CM | POA: Diagnosis not present

## 2023-08-30 DIAGNOSIS — D494 Neoplasm of unspecified behavior of bladder: Secondary | ICD-10-CM | POA: Diagnosis not present

## 2023-08-30 DIAGNOSIS — Z87891 Personal history of nicotine dependence: Secondary | ICD-10-CM | POA: Diagnosis not present

## 2023-08-30 DIAGNOSIS — N35811 Other urethral stricture, male, meatal: Secondary | ICD-10-CM | POA: Diagnosis not present

## 2023-08-30 DIAGNOSIS — N35911 Unspecified urethral stricture, male, meatal: Secondary | ICD-10-CM | POA: Diagnosis not present

## 2023-08-30 SURGERY — TURBT, WITH CHEMOTHERAPEUTIC AGENT INSTILLATION INTO BLADDER
Anesthesia: General | Laterality: Bilateral

## 2023-08-30 MED ORDER — GEMCITABINE CHEMO FOR BLADDER INSTILLATION 2000 MG
2000.0000 mg | Freq: Once | INTRAVENOUS | Status: AC
  Administered 2023-08-30: 2000 mg via INTRAVESICAL
  Filled 2023-08-30: qty 2000

## 2023-08-30 MED ORDER — TRANEXAMIC ACID-NACL 1000-0.7 MG/100ML-% IV SOLN
1000.0000 mg | Freq: Once | INTRAVENOUS | Status: AC
  Administered 2023-08-30: 1000 mg via INTRAVENOUS

## 2023-08-30 MED ORDER — CHLORHEXIDINE GLUCONATE 0.12 % MT SOLN
15.0000 mL | Freq: Once | OROMUCOSAL | Status: AC
  Administered 2023-08-30: 15 mL via OROMUCOSAL

## 2023-08-30 MED ORDER — LACTATED RINGERS IV SOLN: INTRAVENOUS | Status: DC | PRN

## 2023-08-30 MED ORDER — ACETAMINOPHEN 325 MG PO TABS
650.0000 mg | ORAL_TABLET | ORAL | Status: DC | PRN
Start: 2023-08-30 — End: 2023-09-01

## 2023-08-30 MED ORDER — ONDANSETRON HCL 4 MG/2ML IJ SOLN
INTRAMUSCULAR | Status: AC
  Filled 2023-08-30: qty 2

## 2023-08-30 MED ORDER — OXYCODONE HCL 5 MG PO TABS: 5.0000 mg | ORAL_TABLET | Freq: Once | ORAL | Status: DC | PRN

## 2023-08-30 MED ORDER — ONDANSETRON HCL 4 MG/2ML IJ SOLN
INTRAMUSCULAR | Status: DC | PRN
  Administered 2023-08-30: 4 mg via INTRAVENOUS

## 2023-08-30 MED ORDER — HYDROMORPHONE HCL 1 MG/ML IJ SOLN
INTRAMUSCULAR | Status: AC
  Filled 2023-08-30: qty 1

## 2023-08-30 MED ORDER — PROPOFOL 10 MG/ML IV BOLUS
INTRAVENOUS | Status: DC | PRN
  Administered 2023-08-30: 80 mg via INTRAVENOUS

## 2023-08-30 MED ORDER — DIPHENHYDRAMINE HCL 50 MG/ML IJ SOLN: 12.5000 mg | Freq: Four times a day (QID) | INTRAMUSCULAR | Status: DC | PRN

## 2023-08-30 MED ORDER — LACTATED RINGERS IV SOLN: INTRAVENOUS | Status: DC

## 2023-08-30 MED ORDER — ROCURONIUM BROMIDE 100 MG/10ML IV SOLN
INTRAVENOUS | Status: DC | PRN
  Administered 2023-08-30: 50 mg via INTRAVENOUS

## 2023-08-30 MED ORDER — LIDOCAINE HCL (PF) 2 % IJ SOLN
INTRAMUSCULAR | Status: AC
Start: 2023-08-30 — End: ?
  Filled 2023-08-30: qty 15

## 2023-08-30 MED ORDER — SODIUM CHLORIDE 0.9 % IR SOLN
Status: DC | PRN
Start: 2023-08-30 — End: 2023-08-30
  Administered 2023-08-30: 3000 mL via INTRAVESICAL

## 2023-08-30 MED ORDER — HYDROMORPHONE HCL 1 MG/ML IJ SOLN
0.5000 mg | INTRAMUSCULAR | Status: DC | PRN
  Administered 2023-08-30: 1 mg via INTRAVENOUS
  Filled 2023-08-30: qty 1

## 2023-08-30 MED ORDER — HYDROCODONE-ACETAMINOPHEN 5-325 MG PO TABS
1.0000 | ORAL_TABLET | ORAL | Status: DC | PRN
  Administered 2023-08-31: 1 via ORAL
  Filled 2023-08-30: qty 1

## 2023-08-30 MED ORDER — CEFAZOLIN SODIUM-DEXTROSE 2-4 GM/100ML-% IV SOLN
2.0000 g | INTRAVENOUS | Status: AC
  Administered 2023-08-30: 2 g via INTRAVENOUS
  Filled 2023-08-30: qty 100

## 2023-08-30 MED ORDER — OXYCODONE HCL 5 MG/5ML PO SOLN: 5.0000 mg | Freq: Once | ORAL | Status: DC | PRN

## 2023-08-30 MED ORDER — ZOLPIDEM TARTRATE 5 MG PO TABS
5.0000 mg | ORAL_TABLET | Freq: Every evening | ORAL | Status: DC | PRN
Start: 2023-08-30 — End: 2023-09-01

## 2023-08-30 MED ORDER — ATORVASTATIN CALCIUM 20 MG PO TABS
20.0000 mg | ORAL_TABLET | Freq: Every evening | ORAL | Status: DC
  Administered 2023-08-31: 20 mg via ORAL
  Filled 2023-08-30: qty 1

## 2023-08-30 MED ORDER — FENTANYL CITRATE (PF) 100 MCG/2ML IJ SOLN
INTRAMUSCULAR | Status: DC | PRN
  Administered 2023-08-30: 75 ug via INTRAVENOUS
  Administered 2023-08-30: 25 ug via INTRAVENOUS

## 2023-08-30 MED ORDER — CEFAZOLIN SODIUM-DEXTROSE 2-4 GM/100ML-% IV SOLN
2.0000 g | Freq: Three times a day (TID) | INTRAVENOUS | Status: DC
  Administered 2023-08-30 – 2023-09-01 (×5): 2 g via INTRAVENOUS
  Filled 2023-08-30 (×7): qty 100

## 2023-08-30 MED ORDER — SODIUM CHLORIDE 0.9 % IV SOLN: INTRAVENOUS | Status: DC

## 2023-08-30 MED ORDER — IOHEXOL 300 MG/ML  SOLN
INTRAMUSCULAR | Status: DC | PRN
  Administered 2023-08-30: 30 mL via URETHRAL

## 2023-08-30 MED ORDER — PHENAZOPYRIDINE HCL 200 MG PO TABS: 200.0000 mg | ORAL_TABLET | Freq: Three times a day (TID) | ORAL | 0 refills | Status: DC | PRN

## 2023-08-30 MED ORDER — ORAL CARE MOUTH RINSE: 15.0000 mL | Freq: Once | OROMUCOSAL | Status: AC

## 2023-08-30 MED ORDER — DEXAMETHASONE SODIUM PHOSPHATE 10 MG/ML IJ SOLN
INTRAMUSCULAR | Status: AC
  Filled 2023-08-30: qty 1

## 2023-08-30 MED ORDER — DEXAMETHASONE SODIUM PHOSPHATE 4 MG/ML IJ SOLN
INTRAMUSCULAR | Status: DC | PRN
  Administered 2023-08-30: 5 mg via INTRAVENOUS

## 2023-08-30 MED ORDER — ONDANSETRON HCL 4 MG/2ML IJ SOLN: 4.0000 mg | INTRAMUSCULAR | Status: DC | PRN

## 2023-08-30 MED ORDER — ROCURONIUM BROMIDE 10 MG/ML (PF) SYRINGE
PREFILLED_SYRINGE | INTRAVENOUS | Status: AC
  Filled 2023-08-30: qty 30

## 2023-08-30 MED ORDER — LIDOCAINE HCL (CARDIAC) PF 100 MG/5ML IV SOSY
PREFILLED_SYRINGE | INTRAVENOUS | Status: DC | PRN
  Administered 2023-08-30: 80 mg via INTRAVENOUS

## 2023-08-30 MED ORDER — DIPHENHYDRAMINE HCL 12.5 MG/5ML PO ELIX: 12.5000 mg | ORAL_SOLUTION | Freq: Four times a day (QID) | ORAL | Status: DC | PRN

## 2023-08-30 MED ORDER — SUGAMMADEX SODIUM 200 MG/2ML IV SOLN
INTRAVENOUS | Status: DC | PRN
  Administered 2023-08-30: 200 mg via INTRAVENOUS

## 2023-08-30 MED ORDER — PROPOFOL 10 MG/ML IV BOLUS
INTRAVENOUS | Status: AC
  Filled 2023-08-30: qty 20

## 2023-08-30 MED ORDER — OXYBUTYNIN CHLORIDE 5 MG PO TABS
5.0000 mg | ORAL_TABLET | Freq: Three times a day (TID) | ORAL | Status: DC | PRN
Start: 2023-08-30 — End: 2023-09-01
  Administered 2023-08-31: 5 mg via ORAL
  Filled 2023-08-30: qty 1

## 2023-08-30 MED ORDER — FENTANYL CITRATE (PF) 100 MCG/2ML IJ SOLN
INTRAMUSCULAR | Status: AC
  Filled 2023-08-30: qty 2

## 2023-08-30 MED ORDER — TRANEXAMIC ACID-NACL 1000-0.7 MG/100ML-% IV SOLN
INTRAVENOUS | Status: AC
  Filled 2023-08-30: qty 100

## 2023-08-30 MED ORDER — HYDROMORPHONE HCL 1 MG/ML IJ SOLN
0.2500 mg | INTRAMUSCULAR | Status: DC | PRN
  Administered 2023-08-30: 0.5 mg via INTRAVENOUS

## 2023-08-30 MED ORDER — OXYBUTYNIN CHLORIDE 5 MG PO TABS: 5.0000 mg | ORAL_TABLET | Freq: Three times a day (TID) | ORAL | 1 refills | Status: DC | PRN

## 2023-08-30 MED ORDER — ONDANSETRON HCL 4 MG/2ML IJ SOLN: 4.0000 mg | Freq: Once | INTRAMUSCULAR | Status: DC | PRN

## 2023-08-30 SURGICAL SUPPLY — 17 items
BAG URINE DRAIN 2000ML AR STRL (UROLOGICAL SUPPLIES) IMPLANT
BAG URO CATCHER STRL LF (MISCELLANEOUS) ×1 IMPLANT
CATH FOLEY 2WAY SLVR 5CC 18FR (CATHETERS) IMPLANT
CATH URETL OPEN 5X70 (CATHETERS) IMPLANT
DRAPE FOOT SWITCH (DRAPES) ×1 IMPLANT
ELECT REM PT RETURN 15FT ADLT (MISCELLANEOUS) ×1 IMPLANT
GLOVE SURG LX STRL 8.0 MICRO (GLOVE) ×1 IMPLANT
GOWN STRL SURGICAL XL XLNG (GOWN DISPOSABLE) ×1 IMPLANT
GUIDEWIRE ZIPWRE .038 STRAIGHT (WIRE) IMPLANT
KIT TURNOVER KIT A (KITS) IMPLANT
LOOP CUT BIPOLAR 24F LRG (ELECTROSURGICAL) IMPLANT
MANIFOLD NEPTUNE II (INSTRUMENTS) ×1 IMPLANT
PACK CYSTO (CUSTOM PROCEDURE TRAY) ×1 IMPLANT
STENT URET 6FRX24 CONTOUR (STENTS) IMPLANT
SYRINGE TOOMEY IRRIG 70ML (MISCELLANEOUS) IMPLANT
TUBING CONNECTING 10 (TUBING) ×1 IMPLANT
TUBING UROLOGY SET (TUBING) ×1 IMPLANT

## 2023-08-30 NOTE — Anesthesia Postprocedure Evaluation (Signed)
 Anesthesia Post Note  Patient: Jeremy Holmes  Procedure(s) Performed: TURBT, WITH CHEMOTHERAPEUTIC AGENT INSTILLATION INTO BLADDER (Bilateral)     Patient location during evaluation: PACU Anesthesia Type: General Level of consciousness: awake and alert and oriented Pain management: pain level controlled Vital Signs Assessment: post-procedure vital signs reviewed and stable Respiratory status: spontaneous breathing, nonlabored ventilation and respiratory function stable Cardiovascular status: blood pressure returned to baseline and stable Postop Assessment: no apparent nausea or vomiting Anesthetic complications: no   No notable events documented.  Last Vitals:  Vitals:   08/30/23 1431 08/30/23 1445  BP: (!) 191/72 (!) 178/72  Pulse: (!) 57 (!) 51  Resp: 20 15  Temp: 36.5 C   SpO2: 100% 97%    Last Pain:  Vitals:   08/30/23 1445  TempSrc:   PainSc: 0-No pain                 Etai Copado A.

## 2023-08-30 NOTE — Anesthesia Procedure Notes (Signed)
 Procedure Name: Intubation Date/Time: 08/30/2023 1:39 PM  Performed by: Hunter Maha, CRNAPre-anesthesia Checklist: Patient identified, Emergency Drugs available, Suction available and Patient being monitored Patient Re-evaluated:Patient Re-evaluated prior to induction Oxygen Delivery Method: Circle system utilized Preoxygenation: Pre-oxygenation with 100% oxygen Induction Type: IV induction Ventilation: Mask ventilation without difficulty Laryngoscope Size: Mac and 4 Grade View: Grade I Tube type: Oral Tube size: 7.5 mm Number of attempts: 1 Airway Equipment and Method: Stylet and Oral airway Placement Confirmation: ETT inserted through vocal cords under direct vision, positive ETCO2 and breath sounds checked- equal and bilateral Secured at: 21 cm Tube secured with: Tape Dental Injury: Teeth and Oropharynx as per pre-operative assessment

## 2023-08-30 NOTE — Op Note (Signed)
 Operative Note  Preoperative diagnosis:  1.  2 cm enhancing bladder mass  Postoperative diagnosis: 1.  2 cm papillary bladder tumor with features concerning for urothelial cell carcinoma involving the right posterior bladder wall and is immediately adjacent to the right ureteral orifice, but not directly involving it 2.  Meatal stenosis  Procedure(s): 1.  Cystoscopy with TURBT (medium) 2.  Right retrograde pyelogram with intraoperative interpretation of fluoroscopic imaging 3.  Cystogram with intraoperative interpretation of fluoroscopic imaging 4.  Right ureteral stent placement 5.  Urethral dilation with Dustin Gimenez sounds 6.  Intravesical instillation of gemcitabine  Surgeon: Yevonne Heman, MD  Assistants:  None  Anesthesia:  General  Complications:  None  EBL: 10 mL  Specimens: 1.  Superficial and deep margins of right posterior bladder wall tumor  Drains/Catheters: 1.  18 French Foley catheter  Intraoperative findings:   Mild meatal stenosis 2 cm papillary bladder tumor involving the right posterior bladder wall that is immediately adjacent to, but not directly involving the right ureteral orifice Solitary right collecting system with no filling defects or dilation involving the right ureter or right renal pelvis seen on retrograde pyelogram Cystogram was negative for evidence of extra or intraperitoneal bladder perforation Severe bladder trabeculation  Indication:  Jeremy Holmes is a 89 y.o. male with a solid and enhancing bladder tumor seen on recent cross-sectional imaging.  He has been consented for the above procedures, voices understanding and wishes to proceed.  Description of procedure:  After informed consent was obtained, the patient was brought to the operating room and general endotracheal anesthesia was administered. The patient was then placed in the dorsolithotomy position and prepped and draped in the usual sterile fashion. A timeout was performed.  A 21 French rigid cystoscope was then inserted into the urethral meatus which was found to be somewhat stenotic.  The cystoscope was then removed and [name] sounds were then used to dilate the urethral meatus starting at 66 [place] and progressing up to 28 [place], and 2 [place] increments.  I then reinserted the rigid cystoscope and advanced into the bladder where a 2 cm papillary bladder tumor was seen involving the right posterior bladder wall and was immediately adjacent to, but not directly involving the right ureteral orifice.  No other intravesical lesions were seen.  The rigid cystoscope was then exchanged for a 26 French resectoscope with a bipolar loop working element.  The right posterior bladder wall tumor was then superficially resected and that specimen was sent off for permanent section.  I then resected down to the detrusor musculature, which was also sent as a separate specimen for permanent section.  The area of resection was then extensively fulgurated until hemostasis was achieved.  The resection bed was overlying the right ureteral sheath, but not involving the right ureteral orifice.  A 5 French ureteral catheter was then inserted into the right ureteral orifice and a retrograde pyelogram was obtained, with the findings listed above.  A Glidewire was then used to intubate the lumen of the ureteral catheter and was advanced up to the right renal pelvis, under fluoroscopic guidance.  The catheter was then removed, leaving the wire in place.  Due to the close proximity of the resection to the right ureteral orifice, I made the decision to place a ureteral stent.  A 6 French, 24 cm JJ stent was then advanced over the Glidewire and into good position within the right collecting system, confirming placement via fluoroscopy.  The cystoscope was  then removed and an 9 French Foley catheter was inserted.  The balloon of the catheter was inflated with 20 mL of sterile water.  A cystogram was  obtained using Omnipaque  and the bladder was filled with approximately 300 mL of normal saline and revealed no evidence of contrast extravasation during the filling phase.  The bladder was then decompressed and serial fluoroscopic images were obtained, also showing no evidence of bladder perforation.  The Foley catheter was then extensively hand irrigated with return of clear irrigant.  While in the recovery room 2000 mg of gemcitabine in 50 mL of water was instilled in the bladder through the catheter and the catheter was plugged. This will remain indwelling for approximately one hour.   Plan: Home with Foley catheter with plans for the patient to remove his Foley catheter at 7 AM on 09/04/2023.  Follow-up on 09/07/2023 for cystoscopy with right ureteral stent removal and to discuss pathology results.

## 2023-08-30 NOTE — Progress Notes (Signed)
   08/30/23 2300  Urethral Catheter Dr Sherrine Dolly Latex 18 Fr.  Placement Date/Time: 08/30/23 1411   Inserted prior to hospital arrival?: No  Inserted prior to unit arrival?: No  Perineal care performed prior to insertion?: Yes  Person Inserting LDA: Dr Sherrine Dolly  Patient Location at Time of Insertion [employer], Depart...  Input (mL) 30 mL  Output (mL) 40 mL   Pt complained of feeling an urge to void. Hand irrigated and had approximately 10 ml of tiny bits of clots. Urine went from dark red to bright red. Pt verbalized feeling relief after. Will monitor continuously.

## 2023-08-30 NOTE — Progress Notes (Signed)
 The patient's urine was grossly bloody following drainage of his catheter after his gemcitabine instillation.  I hand irrigated a few small clots with eventual return of light pink to red Kool-Aid tinged urine.  He received TXA in the PACU.  Will admit for observation overnight.  Hand irrigate Foley catheter as needed.

## 2023-08-30 NOTE — H&P (Signed)
 Office Visit Report     08/10/2023   --------------------------------------------------------------------------------   Jeremy Holmes  MRN: [id number]  DOB: 1934-02-15, 89 year old Male  SSN:    PRIMARY CARE:  Conway Dennis. Denzil Flatten), MD  PRIMARY CARE FAX:  [phone]  REFERRING:    PROVIDER:  Leila Punt, Jeana Michaels, M.D.  TREATING:  Yevonne Heman, M.D.  LOCATION:  [facility] [phone]     --------------------------------------------------------------------------------   CC/HPI: Bladder mass   Jeremy Holmes is an 89 year old male who was found to have a 2.1 x 1.3 cm enhancing mass involving the posterior bladder wall on CT abdomen/pelvis with contrast on 07/18/2023 during evaluation for left lower quadrant pain. From a urinary aspect, the patient reports good force of stream and feels like he is emptying his bladder well. He denies any prior history of gross hematuria or GU surgery/trauma. UA today is clear.     ALLERGIES: None   MEDICATIONS: Aspirin 81  Atorvastatin Calcium 20 MG Tablet  Cinnamon  Fish Oil  Iron  Magnesium   Multivitamin  Vitamin C     GU PSH: None     PSH Notes: septic knee   NON-GU PSH: Hernia Repair Rotator cuff surgery     GU PMH: None     PMH Notes: Musculoskeletal and Integument  Unilateral primary osteoarthritis, right knee  Baker cyst, right  Septic arthritis of knee, right (HCC)  Pyogenic arthritis of right knee joint (HCC)   Other   Pes planus of left foot  Generalized anxiety disorder  Adjustment disorder with depressed mood  Medication monitoring encounter  Early stage nonexudative age-related macular degeneration of both eyes  History of vitrectomy  Right inguinal hernia  Iron deficiency anemia due to chronic blood loss     NON-GU PMH: Arthritis Skin Cancer, History    FAMILY HISTORY: 2 daughters - Daughter 1 son - Son   SOCIAL HISTORY: Marital Status: Married Preferred Language: English;  Race: White Current Smoking Status: Patient does not smoke anymore.   Tobacco Use Assessment Completed: Used Tobacco in last 30 days? Has never drank.  Drinks 1 caffeinated drink per day.    REVIEW OF SYSTEMS:    GU Review Male:   Patient reports frequent urination, hard to postpone urination, get up at night to urinate, and trouble starting your stream. Patient denies burning/ pain with urination, leakage of urine, stream starts and stops, have to strain to urinate , erection problems, and penile pain.  Gastrointestinal (Upper):   Patient denies nausea, vomiting, and indigestion/ heartburn.  Gastrointestinal (Lower):   Patient denies constipation and diarrhea.  Constitutional:   Patient denies fever, night sweats, weight loss, and fatigue.  Skin:   Patient denies skin rash/ lesion and itching.  Eyes:   Patient denies blurred vision and double vision.  Ears/ Nose/ Throat:   Patient denies sore throat and sinus problems.  Hematologic/Lymphatic:   Patient denies swollen glands and easy bruising.  Cardiovascular:   Patient denies leg swelling and chest pains.  Respiratory:   Patient denies cough and shortness of breath.  Endocrine:   Patient denies excessive thirst.  Musculoskeletal:   Patient denies back pain and joint pain.  Neurological:   Patient denies headaches and dizziness.  Psychologic:   Patient denies depression and anxiety.   VITAL SIGNS:      08/10/2023 04:15 PM  Weight 140 lb / 63.5 kg  Height 68 in / 172.72 cm  BP 147/61 mmHg  Pulse 67 /min  BMI 21.3 kg/m   MULTI-SYSTEM PHYSICAL EXAMINATION:    Constitutional: Well-nourished. No physical deformities. Normally developed. Good grooming.  Respiratory: No labored breathing, no use of accessory muscles.   Neurologic / Psychiatric: Oriented to time, oriented to place, oriented to person. No depression, no anxiety, no agitation.     Complexity of Data:  X-Ray Review: C.T. Abdomen/Pelvis: Reviewed Films. Reviewed Report.  Discussed With Patient.    Notes:                     CLINICAL DATA: Left lower quadrant abdominal pain for evaluation of  hernia. * Tracking Code: BO *   EXAM:  CT ABDOMEN AND PELVIS WITH CONTRAST   TECHNIQUE:  Multidetector CT imaging of the abdomen and pelvis was performed  using the standard protocol following bolus administration of  intravenous contrast.   RADIATION DOSE REDUCTION: This exam was performed according to the  departmental dose-optimization program which includes automated  exposure control, adjustment of the mA and/or kV according to  patient size and/or use of iterative reconstruction technique.   CONTRAST: OMNIPAQUE  IOHEXOL  300 MG/ML SOLN   COMPARISON: CT abdomen and pelvis dated 12/27/2016   FINDINGS:  Lower chest: No focal consolidation or pulmonary nodule in the lung  bases. No pleural effusion or pneumothorax demonstrated. Partially  imaged heart size is normal.   Hepatobiliary: No focal hepatic lesions. No intra or extrahepatic  biliary ductal dilation. Normal gallbladder.   Pancreas: No focal lesions or main ductal dilation.   Spleen: Normal in size without focal abnormality.   Adrenals/Urinary Tract: No adrenal nodules. No suspicious renal  mass, calculi or hydronephrosis. 2.1 x 1.3 cm enhancing irregular  focus in the posterior midline bladder base (2:66).   Stomach/Bowel: Normal appearance of the stomach. No evidence of  bowel wall thickening, distention, or inflammatory changes. Colonic  diverticulosis without acute diverticulitis. Normal appendix.   Vascular/Lymphatic: Aortic atherosclerosis. No enlarged abdominal or  pelvic lymph nodes.   Reproductive: Prostate is unremarkable.   Other: No free fluid, fluid collection, or free air.   Musculoskeletal: No acute or abnormal lytic or blastic osseous  lesions. Small fat-containing paraumbilical and bilateral inguinal  hernias. Multilevel degenerative changes of the partially imaged   thoracic and lumbar spine.   IMPRESSION:  1. A 2.1 x 1.3 cm enhancing irregular focus in the posterior midline  bladder base, suspicious for urothelial neoplasm. Recommend  cystoscopy for further evaluation.  2. Small fat-containing paraumbilical and bilateral inguinal  hernias.  3. Colonic diverticulosis without acute diverticulitis.  4. Aortic Atherosclerosis (ICD10-I70.0).    Electronically Signed  By: Limin Xu M.D.  On: 07/18/2023 11:50     PROCEDURES:          Urinalysis Dipstick Dipstick Cont'd  Color: Yellow Bilirubin: Neg mg/dL  Appearance: Clear Ketones: Neg mg/dL  Specific Gravity: 1.610 Blood: Neg ery/uL  pH: 7.0 Protein: Trace mg/dL  Glucose: Neg mg/dL Urobilinogen: 0.2 mg/dL    Nitrites: Neg    Leukocyte Esterase: Neg leu/uL    ASSESSMENT:      ICD-10 Details  1 GU:   Bladder tumor/neoplasm - D41.4 Undiagnosed New Problem   PLAN:           Schedule Return Visit/Planned Activity: Next Available Appointment - Schedule Surgery          Document Letter(s):  Created for Robert K. Denzil Flatten), MD   Created for Patient: Clinical Summary         Notes:   -  CT results discussed with the patient. His solid enhancing bladder lesion has a high probability of being urothelial carcinoma.  -The risks, benefits and alternatives of cystoscopy with TURBT with gemcitabine instillation was discussed with the patient. The risks include, but are not limited to, bleeding, urinary tract infection, bladder perforation requiring prolonged catheterization and/or open bladder repair, ureteral obstruction, voiding dysfunction and the inherent risks of general anesthesia. The patient voices understanding and wishes to proceed.

## 2023-08-30 NOTE — Progress Notes (Signed)
 Called by PACU because patient's catheter was not draining. On my arrival, 69F catheter in place with no urine in tubing. I was able to flush forward but could not withdraw. I deflated the balloon, hubbed the catheter, and still had no return of urine. I elected to upsize his foley.  The indwelling foley was removed and he was prepped and draped. A 71F 3 way hematuria catheter was attempted but would not bypass his narrow meatus. I then passed a 15F 3 way hematuria catheter into his urethra with mild meatal resistance. This was hubbed with immediate return of clear strawberry red urine. I inflated the balloon with 10cc of sterile water and placed to gravity drainage with return of 300cc of urine. I then irrigated his catheter with 500cc of sterile saline and return of about 50cc of small clot. Draining urine was light pink, so I left the third port capped and did not elect to start CBI.   Nursing to hand irrigate PRN.

## 2023-08-30 NOTE — Transfer of Care (Signed)
 Immediate Anesthesia Transfer of Care Note  Patient: Jeremy Holmes  Procedure(s) Performed: TURBT, WITH CHEMOTHERAPEUTIC AGENT INSTILLATION INTO BLADDER (Bilateral)  Patient Location: PACU  Anesthesia Type:General  Level of Consciousness: awake and alert   Airway & Oxygen Therapy: Patient Spontanous Breathing and Patient connected to nasal cannula oxygen  Post-op Assessment: Report given to RN and Post -op Vital signs reviewed and stable  Post vital signs: Reviewed and stable  Last Vitals:  Vitals Value Taken Time  BP    Temp    Pulse 57 08/30/23 1432  Resp 19 08/30/23 1432  SpO2 100 % 08/30/23 1432  Vitals shown include unfiled device data.  Last Pain:  Vitals:   08/30/23 1150  TempSrc:   PainSc: 0-No pain         Complications: No notable events documented.

## 2023-08-30 NOTE — Anesthesia Preprocedure Evaluation (Signed)
 Anesthesia Evaluation  Patient identified by MRN, date of birth, ID band Patient awake    Reviewed: Allergy & Precautions, NPO status , Patient's Chart, lab work & pertinent test results, reviewed documented beta blocker date and time   Airway Mallampati: II  TM Distance: >3 FB     Dental  (+) Dental Advisory Given, Edentulous Upper   Pulmonary pneumonia, resolved, former smoker   Pulmonary exam normal breath sounds clear to auscultation       Cardiovascular negative cardio ROS Normal cardiovascular exam Rhythm:Regular Rate:Normal     Neuro/Psych  PSYCHIATRIC DISORDERS Anxiety Depression    negative neurological ROS     GI/Hepatic negative GI ROS, Neg liver ROS,,,  Endo/Other  HLD Prediabetes  Renal/GU negative Renal ROS   Bladder neoplasm    Musculoskeletal  (+) Arthritis , Osteoarthritis,    Abdominal   Peds  Hematology  (+) Blood dyscrasia, anemia   Anesthesia Other Findings   Reproductive/Obstetrics                              Anesthesia Physical Anesthesia Plan  ASA: 2  Anesthesia Plan: General   Post-op Pain Management: Minimal or no pain anticipated, Dilaudid  IV and Precedex   Induction: Intravenous  PONV Risk Score and Plan: Treatment may vary due to age or medical condition  Airway Management Planned: Oral ETT  Additional Equipment: None  Intra-op Plan:   Post-operative Plan: Extubation in OR  Informed Consent: I have reviewed the patients History and Physical, chart, labs and discussed the procedure including the risks, benefits and alternatives for the proposed anesthesia with the patient or authorized representative who has indicated his/her understanding and acceptance.     Dental advisory given  Plan Discussed with: CRNA and Anesthesiologist  Anesthesia Plan Comments:          Anesthesia Quick Evaluation

## 2023-08-31 DIAGNOSIS — C679 Malignant neoplasm of bladder, unspecified: Secondary | ICD-10-CM | POA: Diagnosis not present

## 2023-08-31 DIAGNOSIS — Z85828 Personal history of other malignant neoplasm of skin: Secondary | ICD-10-CM | POA: Diagnosis not present

## 2023-08-31 DIAGNOSIS — Z87891 Personal history of nicotine dependence: Secondary | ICD-10-CM | POA: Diagnosis not present

## 2023-08-31 DIAGNOSIS — N35811 Other urethral stricture, male, meatal: Secondary | ICD-10-CM | POA: Diagnosis not present

## 2023-08-31 LAB — BASIC METABOLIC PANEL WITH GFR
Anion gap: 8 (ref 5–15)
BUN: 23 mg/dL (ref 8–23)
CO2: 26 mmol/L (ref 22–32)
Calcium: 8.6 mg/dL — ABNORMAL LOW (ref 8.9–10.3)
Chloride: 101 mmol/L (ref 98–111)
Creatinine, Ser: 0.78 mg/dL (ref 0.61–1.24)
GFR, Estimated: >60 mL/min (ref >60)
Glucose, Bld: 152 mg/dL — ABNORMAL HIGH (ref 70–99)
Potassium: 4.4 mmol/L (ref 3.5–5.1)
Sodium: 135 mmol/L (ref 135–145)

## 2023-08-31 LAB — HEMOGLOBIN AND HEMATOCRIT, BLOOD
HCT: 34.2 % — ABNORMAL LOW (ref 39.0–52.0)
Hemoglobin: 11.2 g/dL — ABNORMAL LOW (ref 13.0–17.0)

## 2023-08-31 MED ORDER — CHLORHEXIDINE GLUCONATE CLOTH 2 % EX PADS
6.0000 | MEDICATED_PAD | Freq: Every day | CUTANEOUS | Status: DC
  Administered 2023-08-31 – 2023-09-01 (×2): 6 via TOPICAL

## 2023-08-31 MED ORDER — ORAL CARE MOUTH RINSE: 15.0000 mL | OROMUCOSAL | Status: DC | PRN

## 2023-08-31 NOTE — Progress Notes (Signed)
   08/30/23 2300  Urethral Catheter Hilliard Loyal, NP Triple-lumen 20 Fr.  Placement Date/Time: 08/30/23 2140   Inserted prior to hospital arrival?: No  Inserted prior to unit arrival?: No  Person Inserting LDA: Hilliard Loyal, NP  Catheter Type: Triple-lumen  Tube Size (Fr.): 20 Fr.  Catheter Balloon Size: 10 mL  Input (mL) 30 mL  Output (mL) 40 mL   Pt complained of feeling an urge to void. Hand irrigated and had approximately 10 ml of tiny bits of clots. Urine went from dark red to bright red. Pt verbalized feeling relief after. Will monitor continuously.

## 2023-08-31 NOTE — Progress Notes (Signed)
 1 Day Post-Op Subjective: Catheter exchanged for 8F 3-way overnight due to obstruction.  Foley draining rosea colored urine this AM.  Pt reports mild nausea and has not eaten anything this AM.    Objective: Vital signs in last 24 hours: Temp:  [97.5 F (36.4 C)-98.2 F (36.8 C)] 97.9 F (36.6 C) (06/05 0535) Pulse Rate:  [51-76] 64 (06/05 0535) Resp:  [12-27] 16 (06/05 0535) BP: (138-218)/(50-114) 138/60 (06/05 0535) SpO2:  [94 %-100 %] 97 % (06/05 0535) Weight:  [60 kg] 60 kg (06/04 1150)  Intake/Output from previous day: 06/04 0701 - 06/05 0700 In: 1081.2 [I.V.:821.2; IV Piggyback:200] Out: 630 [Urine:630]  Intake/Output this shift: No intake/output data recorded.  Physical Exam:  General: Alert and oriented CV: RRR, palpable distal pulses Lungs: CTAB, equal chest rise GU:  Foley draining rosea tinged urine w/o clots.  Lab Results: Recent Labs    08/29/23 1312 08/31/23 0502  HGB 13.0 11.2*  HCT 40.9 34.2*   BMET Recent Labs    08/29/23 1312 08/31/23 0502  NA 137 135  K 4.4 4.4  CL 100 101  CO2 29 26  GLUCOSE 138* 152*  BUN 18 23  CREATININE 0.91 0.78  CALCIUM 9.4 8.6*     Studies/Results: DG C-Arm 1-60 Min-No Report Result Date: 08/30/2023 Fluoroscopy was utilized by the requesting physician.  No radiographic interpretation.    Assessment/Plan: POD1 s/p TURBT with gemcitabine instillation and right ureteral stent placement.  Post-op hematuria  -Advance diet as tolerated -Flush catheter PRN worsening hematuria and/or obstruction -Will recheck around noon today for possible discharge   LOS: 0 days   Yevonne Heman, MD [facility] Pager: [phone]  08/31/2023, 8:19 AM

## 2023-08-31 NOTE — Progress Notes (Signed)
 Catheter is draining light pink urine.  He is slightly nauseous and is apprehensive about going home.  Will continue to monitor overnight and hopefully discharged home tomorrow.

## 2023-08-31 NOTE — Care Management Obs Status (Signed)
 MEDICARE OBSERVATION STATUS NOTIFICATION   Patient Details  Name: Jeremy Holmes MRN: [id number] Date of Birth: 1933/06/18   Medicare Observation Status Notification Given:  Yes    MahabirThersia Flax, RN 08/31/2023, 3:24 PM

## 2023-08-31 NOTE — TOC Transition Note (Signed)
 Transition of Care [facility]) - Discharge Note   Patient Details  Name: Jeremy Holmes MRN: [id number] Date of Birth: 12-07-1933  [facility] CM/SW Contact:  Ruben Corolla, RN Phone Number: 08/31/2023, 10:04 AM   Clinical Narrative: d/c plan home.      Final next level of care: Home/Self Care Barriers to Discharge: No Barriers Identified   Patient Goals and CMS Choice Patient states their goals for this hospitalization and ongoing recovery are:: Home CMS Medicare.gov Compare Post Acute Care list provided to:: Patient Choice offered to / list presented to : Patient      Discharge Placement                       Discharge Plan and Services Additional resources added to the After Visit Summary for                                       Social Drivers of Health (SDOH) Interventions SDOH Screenings   Food Insecurity: No Food Insecurity (08/30/2023)  Housing: Low Risk  (08/30/2023)  Transportation Needs: No Transportation Needs (08/30/2023)  Utilities: Not At Risk (08/30/2023)  Depression (PHQ2-9): Low Risk  (09/02/2020)  Social Connections: Unknown (08/30/2023)  Tobacco Use: Medium Risk (08/30/2023)     Readmission Risk Interventions     No data to display

## 2023-09-01 DIAGNOSIS — Z85828 Personal history of other malignant neoplasm of skin: Secondary | ICD-10-CM | POA: Diagnosis not present

## 2023-09-01 DIAGNOSIS — C679 Malignant neoplasm of bladder, unspecified: Secondary | ICD-10-CM | POA: Diagnosis not present

## 2023-09-01 DIAGNOSIS — N35811 Other urethral stricture, male, meatal: Secondary | ICD-10-CM | POA: Diagnosis not present

## 2023-09-01 DIAGNOSIS — Z87891 Personal history of nicotine dependence: Secondary | ICD-10-CM | POA: Diagnosis not present

## 2023-09-01 LAB — SURGICAL PATHOLOGY

## 2023-09-01 MED ORDER — ONDANSETRON HCL 4 MG PO TABS: 4.0000 mg | ORAL_TABLET | Freq: Every day | ORAL | 1 refills | Status: DC | PRN

## 2023-09-01 NOTE — Discharge Summary (Signed)
 Date of admission: 08/30/2023  Date of discharge: 09/01/2023  Admission diagnosis: Bladder tumor  Discharge diagnosis: Bladder tumor  Procedures: TURBT with right ureteral stent, urethral dilation and gemcitabine instillation on 08/30/23.   History and Physical: For full details, please see admission history and physical. Briefly, Jeremy Holmes is a 89 y.o. year old patient with a 2 cm papillary bladder tumor involving the right posterior bladder wall.  He is s/p TURBT on 08/30/23.     Hospital Course: The patient was admitted for observation due to hematuria following his TURBT.  By POD2 his hematuria had significantly improved and he was discharged home with his Foley catheter with plans for him to remove it at home on 09/04/23.   Physical Exam:  General: Alert and oriented CV: RRR, palpable distal pulses Lungs: CTAB, equal chest rise GU:  20 F 3 way Foley in place and draining clear, slightly dark blood tinged urine w/o clots.   Laboratory values:  Recent Labs    08/29/23 1312 08/31/23 0502  HGB 13.0 11.2*  HCT 40.9 34.2*   Recent Labs    08/29/23 1312 08/31/23 0502  CREATININE 0.91 0.78    Disposition: Home  Discharge instruction: The patient was instructed to be ambulatory but told to refrain from heavy lifting, strenuous activity, or driving.  Discharge medications:  Allergies as of 09/01/2023   No Known Allergies      Medication List     STOP taking these medications    aspirin 81 MG chewable tablet       TAKE these medications    acetaminophen  500 MG tablet Commonly known as: TYLENOL  Take 1,000 mg by mouth 2 (two) times daily.   atorvastatin 20 MG tablet Commonly known as: LIPITOR Take 20 mg by mouth daily.   CENTRUM SILVER PO Take 1 tablet by mouth daily.   CINNAMON PO Take 2,000 mg by mouth daily.   ferrous sulfate 325 (65 FE) MG EC tablet Take 325 mg by mouth daily with breakfast.   Fish Oil 1200 MG Caps Take 1 capsule by mouth daily.    Magnesium  250 MG Tabs Take 250 mg by mouth daily.   ondansetron  4 MG tablet Commonly known as: Zofran  Take 1 tablet (4 mg total) by mouth daily as needed for nausea or vomiting.   oxybutynin 5 MG tablet Commonly known as: DITROPAN Take 1 tablet (5 mg total) by mouth every 8 (eight) hours as needed for bladder spasms.   phenazopyridine 200 MG tablet Commonly known as: Pyridium Take 1 tablet (200 mg total) by mouth 3 (three) times daily as needed (for pain with urination).   vitamin C with rose hips 1000 MG tablet Take 1,000 mg by mouth daily.        Followup:   Follow-up Information     Adelbert Homans, MD Follow up on 09/07/2023.   Specialty: Urology Why: Postop appointment at 3:45 PM Contact information: [address] [place] [address] [place] [address] [phone]

## 2023-09-01 NOTE — Progress Notes (Signed)
 Discharge instructions given to patient, including foley care and emptying of catheter bag. Leg bag instructions given as well. Foley cath to be DC on Monday 6/9, instructions given on how to remove catheter. Supplies sent with patient Jeremy Rosevelt Constable RN

## 2023-09-01 NOTE — TOC Transition Note (Signed)
 Transition of Care [facility]) - Discharge Note   Patient Details  Name: Jeremy Holmes MRN: [id number] Date of Birth: [birth year]  [facility]) CM/SW Contact:  Ruben Corolla, RN Phone Number: 09/01/2023, 10:26 AM   Clinical Narrative: d/c home no CM needs.      Final next level of care: Home/Self Care Barriers to Discharge: No Barriers Identified   Patient Goals and CMS Choice Patient states their goals for this hospitalization and ongoing recovery are:: Home CMS Medicare.gov Compare Post Acute Care list provided to:: Patient Choice offered to / list presented to : Patient [place] ownership interest in [facility].provided to:: Patient    Discharge Placement                       Discharge Plan and Services Additional resources added to the After Visit Summary for                                       Social Drivers of Health (SDOH) Interventions SDOH Screenings   Food Insecurity: No Food Insecurity (08/30/2023)  Housing: Low Risk  (08/30/2023)  Transportation Needs: No Transportation Needs (08/30/2023)  Utilities: Not At Risk (08/30/2023)  Depression (PHQ2-9): Low Risk  (09/02/2020)  Social Connections: Unknown (08/30/2023)  Tobacco Use: Medium Risk (08/30/2023)     Readmission Risk Interventions     No data to display

## 2023-09-03 ENCOUNTER — Emergency Department (HOSPITAL_COMMUNITY)
Admission: EM | Admit: 2023-09-03 | Discharge: 2023-09-03 | Disposition: A | Discharge status: Home / Self Care | Attending: Emergency Medicine | Admitting: Emergency Medicine

## 2023-09-03 DIAGNOSIS — T83098A Other mechanical complication of other indwelling urethral catheter, initial encounter: Secondary | ICD-10-CM | POA: Insufficient documentation

## 2023-09-03 DIAGNOSIS — R339 Retention of urine, unspecified: Secondary | ICD-10-CM | POA: Diagnosis not present

## 2023-09-03 DIAGNOSIS — T83091A Other mechanical complication of indwelling urethral catheter, initial encounter: Secondary | ICD-10-CM | POA: Diagnosis not present

## 2023-09-03 DIAGNOSIS — R1030 Lower abdominal pain, unspecified: Secondary | ICD-10-CM | POA: Diagnosis not present

## 2023-09-03 DIAGNOSIS — R319 Hematuria, unspecified: Secondary | ICD-10-CM | POA: Diagnosis not present

## 2023-09-03 DIAGNOSIS — Z8551 Personal history of malignant neoplasm of bladder: Secondary | ICD-10-CM | POA: Insufficient documentation

## 2023-09-03 NOTE — Discharge Instructions (Signed)
 Keep your Foley catheter in place until told otherwise by Dr. Sherrine Dolly.  You can try flushing it at home if you do have another blockage.  If this is unsuccessful, return to the emergency department.

## 2023-09-03 NOTE — ED Provider Notes (Signed)
  EMERGENCY DEPARTMENT AT [facility] Provider Note   CSN: [id number] Arrival date & time: 09/03/23  4098     History  Chief Complaint  Patient presents with   Urinary Retention    Jeremy Holmes is a 89 y.o. male.  HPI Patient presents for urine retention.  Medical history includes prediabetes, depression, HLD, arthritis, anxiety, anemia, bladder cancer.  He was seen in the ED 6 weeks ago where CT scan showed bladder mass.  4 days ago, he underwent TURBT.  A Foley catheter was placed at the time.  He has had frank hematuria output since that time.  At around dinnertime this evening, drainage into Foley bag stopped.  He subsequently developed lower abdominal distention and discomfort.      Home Medications Prior to Admission medications   Medication Sig Start Date End Date Taking? Authorizing Provider  atorvastatin  (LIPITOR) 20 MG tablet Take 20 mg by mouth daily.   Yes [provider]  ondansetron  (ZOFRAN ) 4 MG tablet Take 1 tablet (4 mg total) by mouth daily as needed for nausea or vomiting. 09/01/23 08/31/24 Yes Adelbert Homans, MD  oxybutynin  (DITROPAN ) 5 MG tablet Take 1 tablet (5 mg total) by mouth every 8 (eight) hours as needed for bladder spasms. 08/30/23  Yes Adelbert Homans, MD  acetaminophen  (TYLENOL ) 500 MG tablet Take 1,000 mg by mouth 2 (two) times daily.    [provider]  Ascorbic Acid (VITAMIN C WITH ROSE HIPS) 1000 MG tablet Take 1,000 mg by mouth daily.    [provider]  CINNAMON PO Take 2,000 mg by mouth daily.    [provider]  ferrous sulfate 325 (65 FE) MG EC tablet Take 325 mg by mouth daily with breakfast.    [provider]  Magnesium  250 MG TABS Take 250 mg by mouth daily.    [provider]  Multiple Vitamins-Minerals (CENTRUM SILVER PO) Take 1 tablet by mouth daily.    [provider]  Omega-3 Fatty Acids (FISH OIL) 1200 MG CAPS Take 1 capsule by mouth  daily.    [provider]  phenazopyridine  (PYRIDIUM ) 200 MG tablet Take 1 tablet (200 mg total) by mouth 3 (three) times daily as needed (for pain with urination). 08/30/23 08/29/24  Adelbert Homans, MD      Allergies    Patient has no known allergies.    Review of Systems   Review of Systems  Genitourinary:  Positive for decreased urine volume and hematuria.  All other systems reviewed and are negative.   Physical Exam Updated Vital Signs BP (!) 146/72 (BP Location: Right Arm)   Pulse 75   Temp 97.7 F (36.5 C) (Oral)   Resp 16   SpO2 100%  Physical Exam Vitals and nursing note reviewed.  Constitutional:      General: He is not in acute distress.    Appearance: Normal appearance. He is well-developed. He is not ill-appearing, toxic-appearing or diaphoretic.  HENT:     Head: Normocephalic and atraumatic.     Right Ear: External ear normal.     Left Ear: External ear normal.     Nose: Nose normal.     Mouth/Throat:     Mouth: Mucous membranes are moist.  Eyes:     Extraocular Movements: Extraocular movements intact.     Conjunctiva/sclera: Conjunctivae normal.  Cardiovascular:     Rate and Rhythm: Normal rate and regular rhythm.  Pulmonary:     Effort:  Pulmonary effort is normal. No respiratory distress.  Abdominal:     General: There is no distension.     Palpations: Abdomen is soft.     Tenderness: There is no abdominal tenderness.  Genitourinary:    Comments: Foley bag with gross hematuria.  Approximately 700 cc.  Bedside ultrasound shows appropriate placement of Foley balloon, decompressed bladder with approximately 125 cc of echogenic debris. Musculoskeletal:        General: No swelling. Normal range of motion.     Cervical back: Normal range of motion and neck supple.  Skin:    General: Skin is warm and dry.     Coloration: Skin is not jaundiced or pale.  Neurological:     General: No focal deficit present.     Mental Status: He is alert  and oriented to person, place, and time.  Psychiatric:        Mood and Affect: Mood normal.        Behavior: Behavior normal.     ED Results / Procedures / Treatments   Labs (all labs ordered are listed, but only abnormal results are displayed) Labs Reviewed - No data to display  EKG None  Radiology No results found.  Procedures Procedures    Medications Ordered in ED Medications - No data to display  ED Course/ Medical Decision Making/ A&P                                 Medical Decision Making  Patient presents for Foley catheter blockage.  Foley catheter was placed 4 days ago following TURBT procedure.  He has had ongoing gross hematuria since that time.  He is not on a blood thinner.  He had cessation of Foley bag output earlier this evening.  While in ED triage, Foley was successfully flushed and he subsequently had 700 cc of output.  Output has been gross hematuria.  He had resolution of his abdominal distention and discomfort.  On exam, he is well-appearing.  His symptoms have resolved.  Bedside ultrasound shows approximately 125 cc of echogenic debris in bladder, consistent with blood products.  He states he was told to remove his Foley catheter at home in 2 days and to follow-up with urology 2 days after that.  I spoke with urologist on-call, Dr. Jarvis Mesa, who does not recommend irrigation at this time.  Patient is to leave his Foley catheter in place until he hears from Dr. Sherrine Dolly.  He does have the upcoming appointment in 4 days.  Patient had continued drainage into his Foley bag while in the ED.  Catheter was flushed again with no resistance.  He was discharged in stable condition.        Final Clinical Impression(s) / ED Diagnoses Final diagnoses:  Obstruction of Foley catheter, initial encounter [facility])    Rx / DC Orders ED Discharge Orders     None         Iva Mariner, MD 09/03/23 0225

## 2023-09-03 NOTE — ED Triage Notes (Signed)
 Patient arrived stating his urinary catheter stopped draining tonight, now having lower abdominal pain. Supposed to have catheter removed Monday morning.

## 2023-09-03 NOTE — ED Notes (Signed)
 Foley catheter irrigated, small clot removed. Urinary bag draining properly and patient reporting relief.

## 2023-09-07 DIAGNOSIS — R310 Gross hematuria: Secondary | ICD-10-CM | POA: Diagnosis not present

## 2023-09-07 DIAGNOSIS — N35811 Other urethral stricture, male, meatal: Secondary | ICD-10-CM | POA: Diagnosis not present

## 2023-09-07 DIAGNOSIS — C674 Malignant neoplasm of posterior wall of bladder: Secondary | ICD-10-CM | POA: Diagnosis not present

## 2023-09-15 DIAGNOSIS — N35811 Other urethral stricture, male, meatal: Secondary | ICD-10-CM | POA: Diagnosis not present

## 2023-09-15 DIAGNOSIS — R310 Gross hematuria: Secondary | ICD-10-CM | POA: Diagnosis not present

## 2023-10-12 DIAGNOSIS — H353131 Nonexudative age-related macular degeneration, bilateral, early dry stage: Secondary | ICD-10-CM | POA: Diagnosis not present

## 2023-10-12 DIAGNOSIS — Z9889 Other specified postprocedural states: Secondary | ICD-10-CM | POA: Diagnosis not present

## 2023-10-19 DIAGNOSIS — C674 Malignant neoplasm of posterior wall of bladder: Secondary | ICD-10-CM | POA: Diagnosis not present

## 2023-10-30 ENCOUNTER — Other Ambulatory Visit: Payer: Self-pay | Admitting: *Deleted

## 2023-10-30 ENCOUNTER — Inpatient Hospital Stay: Payer: Medicare Other | Attending: Hematology and Oncology

## 2023-10-30 ENCOUNTER — Inpatient Hospital Stay: Payer: Medicare Other | Admitting: Hematology and Oncology

## 2023-10-30 VITALS — BP 174/66 | HR 67 | Temp 97.5°F | Resp 18 | Wt 134.6 lb

## 2023-10-30 DIAGNOSIS — D509 Iron deficiency anemia, unspecified: Secondary | ICD-10-CM | POA: Diagnosis not present

## 2023-10-30 DIAGNOSIS — Z87891 Personal history of nicotine dependence: Secondary | ICD-10-CM | POA: Diagnosis not present

## 2023-10-30 DIAGNOSIS — D500 Iron deficiency anemia secondary to blood loss (chronic): Secondary | ICD-10-CM

## 2023-10-30 DIAGNOSIS — C679 Malignant neoplasm of bladder, unspecified: Secondary | ICD-10-CM | POA: Insufficient documentation

## 2023-10-30 LAB — CBC WITH DIFFERENTIAL (CANCER CENTER ONLY)
Abs Immature Granulocytes: 0.03 K/uL (ref 0.00–0.07)
Basophils Absolute: 0.1 K/uL (ref 0.0–0.1)
Basophils Relative: 1 %
Eosinophils Absolute: 0.2 K/uL (ref 0.0–0.5)
Eosinophils Relative: 2 %
HCT: 36.2 % — ABNORMAL LOW (ref 39.0–52.0)
Hemoglobin: 12 g/dL — ABNORMAL LOW (ref 13.0–17.0)
Immature Granulocytes: 0 %
Lymphocytes Relative: 19 %
Lymphs Abs: 1.7 K/uL (ref 0.7–4.0)
MCH: 33.1 pg (ref 26.0–34.0)
MCHC: 33.1 g/dL (ref 30.0–36.0)
MCV: 99.7 fL (ref 80.0–100.0)
Monocytes Absolute: 0.6 K/uL (ref 0.1–1.0)
Monocytes Relative: 6 %
Neutro Abs: 6.3 K/uL (ref 1.7–7.7)
Neutrophils Relative %: 72 %
Platelet Count: 332 K/uL (ref 150–400)
RBC: 3.63 MIL/uL — ABNORMAL LOW (ref 4.22–5.81)
RDW: 12.6 % (ref 11.5–15.5)
WBC Count: 8.7 K/uL (ref 4.0–10.5)
nRBC: 0 % (ref 0.0–0.2)

## 2023-10-30 LAB — CMP (CANCER CENTER ONLY)
ALT: 12 U/L (ref 0–44)
AST: 16 U/L (ref 15–41)
Albumin: 4.3 g/dL (ref 3.5–5.0)
Alkaline Phosphatase: 61 U/L (ref 38–126)
Anion gap: 4 — ABNORMAL LOW (ref 5–15)
BUN: 18 mg/dL (ref 8–23)
CO2: 33 mmol/L — ABNORMAL HIGH (ref 22–32)
Calcium: 9.3 mg/dL (ref 8.9–10.3)
Chloride: 102 mmol/L (ref 98–111)
Creatinine: 0.73 mg/dL (ref 0.61–1.24)
GFR, Estimated: >60 mL/min (ref >60)
Glucose, Bld: 128 mg/dL — ABNORMAL HIGH (ref 70–99)
Potassium: 4.7 mmol/L (ref 3.5–5.1)
Sodium: 139 mmol/L (ref 135–145)
Total Bilirubin: 0.3 mg/dL (ref 0.0–1.2)
Total Protein: 7.1 g/dL (ref 6.5–8.1)

## 2023-10-30 LAB — IRON AND IRON BINDING CAPACITY (CC-WL,HP ONLY)
Iron: 83 ug/dL (ref 45–182)
Saturation Ratios: 31 % (ref 17.9–39.5)
TIBC: 270 ug/dL (ref 250–450)
UIBC: 187 ug/dL (ref 117–376)

## 2023-10-30 LAB — RETIC PANEL
Immature Retic Fract: 8.6 % (ref 2.3–15.9)
RBC.: 3.6 MIL/uL — ABNORMAL LOW (ref 4.22–5.81)
Retic Count, Absolute: 23.4 K/uL (ref 19.0–186.0)
Retic Ct Pct: 0.7 % (ref 0.4–3.1)
Reticulocyte Hemoglobin: 37.7 pg (ref >27.9)

## 2023-10-30 LAB — FERRITIN: Ferritin: 208 ng/mL (ref 24–336)

## 2023-10-30 NOTE — Progress Notes (Signed)
 [facility] [phone]   [phone]  PROGRESS NOTE  Patient Care Team: Frederik Charleston, MD as PCP - General (Family Medicine)  Hematological/Oncological History # Iron Deficiency Anemia of Unclear Etiology 01/14/2022: WBC 8.7, Hgb 12.3, MCV 94.8, Plt 334 02/23/2022: establish care with Dr. Federico  09/12/22-09/19/22: Received IV feraheme  510 mg x 2 doses  Interval History:  Jeremy Holmes 89 y.o. male with medical history significant for iron deficiency anemia who presents for a follow up visit. The patient's last visit was on 05/01/2023. In the interim since the last visit, he has been diagnosed with bladder cancer. He is unaccompanied for this visit.   On exam today [name] reports he has been well overall in the interim since her last visit and is doing his best to stay cool and hydrated this summer.  He reports unfortunately he was diagnosed with bladder cancer at the end of May underwent surgery on 08/30/2022.  He reports was post to be an outpatient procedure but he was admitted to the hospital that time.  He notes that he did have an issue where he was passing clots and blood in his urine after having terrible pain.  He reports that this has subsequently resolved.  He notes he follows with Dr. Devere in urology.  He is scheduled for upcoming BCG treatment.  He notes that he is tolerating his iron pills well and does his best to try to eat iron rich food including red meat and veggies though he reports he does not eat quite enough red meat.  He is having neck pain and had a disagreement with his wife this morning which raises his blood pressure.  He otherwise notes no fatigue, lightheadedness, dizziness, or shortness of breath.  Full 10 point ROS is otherwise negative.  MEDICAL HISTORY:  Past Medical History:  Diagnosis Date   Anemia    Anxiety    no meds   Arthritis    right knee   Cancer (HCC)    skin: head,nose,back   Depression    no meds    HLD (hyperlipidemia)    Pneumonia    Pre-diabetes    diet controlled - no meds    SURGICAL HISTORY: Past Surgical History:  Procedure Laterality Date   COLONOSCOPY     ELBOW SURGERY Right    EYE SURGERY Bilateral    cataracts surgery   INGUINAL HERNIA REPAIR Right 04/27/2021   Procedure: OPEN REPAIR RIGHT INGUINAL HERNIA WITH MESH;  Surgeon: Eletha Boas, MD;  Location: [facility];  Service: General;  Laterality: Right;   KNEE ARTHROSCOPY Right 08/20/2020   Procedure: ARTHROSCOPY KNEE AND IRRIGATION AND DEBRIDEMENT;  Surgeon: Jerri Kay HERO, MD;  Location: [facility] OR;  Service: Orthopedics;  Laterality: Right;   SHOULDER SURGERY Bilateral    x 2   WISDOM TOOTH EXTRACTION      SOCIAL HISTORY: Social History   Socioeconomic History   Marital status: Married    Spouse name: Not on file   Number of children: Not on file   Years of education: Not on file   Highest education level: Not on file  Occupational History   Not on file  Tobacco Use   Smoking status: Former    Types: Cigarettes   Smokeless tobacco: Never  Vaping Use   Vaping status: Never Used  Substance and Sexual Activity   Alcohol use: Not Currently    Alcohol/week: 7.0 standard drinks of alcohol    Types:  7 Glasses of wine per week    Comment: occ at night with supper   Drug use: No   Sexual activity: Not on file  Other Topics Concern   Not on file  Social History Narrative   Not on file   Social Drivers of Health   Financial Resource Strain: Not on file  Food Insecurity: No Food Insecurity (08/30/2023)   Hunger Vital Sign    Worried About Running Out of Food in the Last Year: Never true    Ran Out of Food in the Last Year: Never true  Transportation Needs: No Transportation Needs (08/30/2023)   PRAPARE - Administrator, Civil Service (Medical): No    Lack of Transportation (Non-Medical): No  Physical Activity: Not on file  Stress: Not on file  Social Connections: Unknown  (08/30/2023)   Social Connection and Isolation Panel    Frequency of Communication with Friends and Family: More than three times a week    Frequency of Social Gatherings with Friends and Family: More than three times a week    Attends Religious Services: More than 4 times per year    Active Member of [employer] or Organizations: Patient unable to answer    Attends Banker Meetings: Patient unable to answer    Marital Status: Married  Catering manager Violence: Not At Risk (08/30/2023)   Humiliation, Afraid, Rape, and Kick questionnaire    Fear of Current or Ex-Partner: No    Emotionally Abused: No    Physically Abused: No    Sexually Abused: No    FAMILY HISTORY: No family history on file.  ALLERGIES:  has no known allergies.  MEDICATIONS:  Current Outpatient Medications  Medication Sig Dispense Refill   acetaminophen  (TYLENOL ) 500 MG tablet Take 1,000 mg by mouth 2 (two) times daily.     Ascorbic Acid (VITAMIN C WITH ROSE HIPS) 1000 MG tablet Take 1,000 mg by mouth daily.     atorvastatin  (LIPITOR) 20 MG tablet Take 20 mg by mouth daily.     CINNAMON PO Take 2,000 mg by mouth daily.     ferrous sulfate 325 (65 FE) MG EC tablet Take 325 mg by mouth daily with breakfast.     Magnesium  250 MG TABS Take 250 mg by mouth daily.     Multiple Vitamins-Minerals (CENTRUM SILVER PO) Take 1 tablet by mouth daily.     Omega-3 Fatty Acids (FISH OIL) 1200 MG CAPS Take 1 capsule by mouth daily.     ondansetron  (ZOFRAN ) 4 MG tablet Take 1 tablet (4 mg total) by mouth daily as needed for nausea or vomiting. 30 tablet 1   oxybutynin  (DITROPAN ) 5 MG tablet Take 1 tablet (5 mg total) by mouth every 8 (eight) hours as needed for bladder spasms. 30 tablet 1   phenazopyridine  (PYRIDIUM ) 200 MG tablet Take 1 tablet (200 mg total) by mouth 3 (three) times daily as needed (for pain with urination). 30 tablet 0   No current facility-administered medications for this visit.    REVIEW OF SYSTEMS:    Constitutional: ( - ) fevers, ( - )  chills , ( - ) night sweats Eyes: ( - ) blurriness of vision, ( - ) double vision, ( - ) watery eyes Ears, nose, mouth, throat, and face: ( - ) mucositis, ( - ) sore throat Respiratory: ( - ) cough, ( - ) dyspnea, ( - ) wheezes Cardiovascular: ( - ) palpitation, ( - ) chest discomfort, ( - )  lower extremity swelling Gastrointestinal:  ( - ) nausea, ( - ) heartburn, ( - ) change in bowel habits Skin: ( - ) abnormal skin rashes Lymphatics: ( - ) new lymphadenopathy, ( - ) easy bruising Neurological: ( - ) numbness, ( - ) tingling, ( - ) new weaknesses Behavioral/Psych: ( - ) mood change, ( - ) new changes  All other systems were reviewed with the patient and are negative.  PHYSICAL EXAMINATION:  Vitals:   10/30/23 1413 10/30/23 1416  BP: (!) 181/65 (!) 174/66  Pulse: 67   Resp: 18   Temp: (!) 97.5 F (36.4 C)   SpO2: 100%       Filed Weights   10/30/23 1413  Weight: 134 lb 9.6 oz (61.1 kg)     GENERAL: Elderly appearing Caucasian male, alert, no distress and comfortable SKIN: skin color, texture, turgor are normal, no rashes or significant lesions EYES: conjunctiva are pink and non-injected, sclera clear LUNGS: clear to auscultation and percussion with normal breathing effort HEART: regular rate & rhythm and no murmurs and no lower extremity edema Musculoskeletal: no cyanosis of digits and no clubbing  PSYCH: alert & oriented x 3, fluent speech NEURO: no focal motor/sensory deficits  LABORATORY DATA:  I have reviewed the data as listed    Latest Ref Rng & Units 10/30/2023    1:58 PM 08/31/2023    5:02 AM 08/29/2023    1:12 PM  CBC  WBC 4.0 - 10.5 K/uL 8.7   9.2   Hemoglobin 13.0 - 17.0 g/dL 87.9  88.7  86.9   Hematocrit 39.0 - 52.0 % 36.2  34.2  40.9   Platelets 150 - 400 K/uL 332   289        Latest Ref Rng & Units 10/30/2023    1:58 PM 08/31/2023    5:02 AM 08/29/2023    1:12 PM  CMP  Glucose 70 - 99 mg/dL 871  847  861   BUN 8  - 23 mg/dL 18  23  18    Creatinine 0.61 - 1.24 mg/dL 9.26  9.21  9.08   Sodium 135 - 145 mmol/L 139  135  137   Potassium 3.5 - 5.1 mmol/L 4.7  4.4  4.4   Chloride 98 - 111 mmol/L 102  101  100   CO2 22 - 32 mmol/L 33  26  29   Calcium  8.9 - 10.3 mg/dL 9.3  8.6  9.4   Total Protein 6.5 - 8.1 g/dL 7.1     Total Bilirubin 0.0 - 1.2 mg/dL 0.3     Alkaline Phos 38 - 126 U/L 61     AST 15 - 41 U/L 16     ALT 0 - 44 U/L 12       RADIOGRAPHIC STUDIES: No results found.  ASSESSMENT & PLAN SKILER OLDEN is a 89 y.o. male with medical history significant for iron deficiency anemia who presents for a follow up visit.   # Iron Deficiency Anemia 2/2 Unclear Etiology, Suspect due to GI Bleeding -- Findings are consistent with iron deficiency anemia secondary GI bleeding.  --Encouraged her to follow-up with GI for further evaluation.  He currently follows with [facility].  Patient notes he does not wish to undergo endoscopic evaluation. --Received IV feraheme  510 mg x 2 doses from 09/12/2022-09/19/2022.  --Labs from today show WBC 8.7, hemoglobin 12.0, MCV 99.7, platelets 332.  Iron studies pending.  --No need for additional IV iron at this  time.  --Continue ferrous sulfate 325 mg daily with a source of vitamin C --RTC in 6 months for labs/follow up.  No orders of the defined types were placed in this encounter.   All questions were answered. The patient knows to call the clinic with any problems, questions or concerns.  I have spent a total of 25 minutes minutes of face-to-face and non-face-to-face time, preparing to see the patient,  performing a medically appropriate examination, counseling and educating the patient, documenting clinical information in the electronic health record, independently interpreting results and communicating results to the patient, and care coordination.   Norleen IVAR Kidney, MD Department of Hematology/Oncology [facility] at [facility] Phone:  [phone] Pager: [phone] Email: [email or web address]    10/30/2023 9:27 PM

## 2023-11-01 ENCOUNTER — Ambulatory Visit: Payer: Self-pay | Admitting: *Deleted

## 2023-11-01 NOTE — Telephone Encounter (Signed)
-----   Message from Norleen ONEIDA Kidney IV sent at 10/30/2023  9:27 PM EDT ----- Please let Mr. Mandt know that his labs look good.  There is no need for an IV iron infusion at this time.  Please recommend he continue his iron pills.  We will plan to see him back as scheduled in  6 months time with labs. ----- Message ----- From: Interface, Lab In [place] Sent: 10/30/2023   2:09 PM EDT To: Norleen ONEIDA Kidney [place], MD

## 2023-11-01 NOTE — Telephone Encounter (Signed)
 TCT patient regarding recent lab results. Spoke with him. Advised that his labs look good.  There is no need for an IV iron infusion at this time.  Advised that Dr. Federico  recommends he continue his iron pills.  We will plan to see him back as scheduled in  6 months time with labs. Pt voiced understanding. He is aware of his future appts here.

## 2023-11-02 DIAGNOSIS — C674 Malignant neoplasm of posterior wall of bladder: Secondary | ICD-10-CM | POA: Diagnosis not present

## 2023-11-03 DIAGNOSIS — C674 Malignant neoplasm of posterior wall of bladder: Secondary | ICD-10-CM | POA: Diagnosis not present

## 2023-11-22 ENCOUNTER — Other Ambulatory Visit: Payer: Self-pay | Admitting: Hematology and Oncology

## 2023-12-25 DIAGNOSIS — C674 Malignant neoplasm of posterior wall of bladder: Secondary | ICD-10-CM | POA: Diagnosis not present

## 2023-12-25 DIAGNOSIS — N35813 Other membranous urethral stricture, male: Secondary | ICD-10-CM | POA: Diagnosis not present

## 2024-01-01 ENCOUNTER — Other Ambulatory Visit: Payer: Self-pay | Admitting: Urology

## 2024-01-09 DIAGNOSIS — R300 Dysuria: Secondary | ICD-10-CM | POA: Diagnosis not present

## 2024-01-22 NOTE — Progress Notes (Signed)
 Anesthesia Review:  PCP: Cardiologist :  PPM/ ICD: Device Orders: Rep Notified:  Chest x-ray : EKG : Echo : Stress test: Cardiac Cath :   Activity level:  Sleep Study/ CPAP : Fasting Blood Sugar :      / Checks Blood Sugar -- times a day:    Blood Thinner/ Instructions /Last Dose: ASA / Instructions/ Last Dose :

## 2024-01-23 NOTE — Patient Instructions (Signed)
 SURGICAL WAITING ROOM VISITATION  Patients having surgery or a procedure may have no more than 2 support people in the waiting area - these visitors may rotate.    Children under the age of 69 must have an adult with them who is not the patient.  Visitors with respiratory illnesses are discouraged from visiting and should remain at home.  If the patient needs to stay at the hospital during part of their recovery, the visitor guidelines for inpatient rooms apply. Pre-op nurse will coordinate an appropriate time for 1 support person to accompany patient in pre-op.  This support person may not rotate.    Please refer to the [facility] website for the visitor guidelines for Inpatients (after your surgery is over and you are in a regular room).       Your procedure is scheduled on: 01/31/2024    Report to [facility] Main Entrance    Report to admitting at  0810 AM   Call this number if you have problems the morning of surgery [phone]   Do not eat food  or drink liquids :After Midnight.                                If you have questions, please contact your surgeon's office.      Oral Hygiene is also important to reduce your risk of infection.                                    Remember - BRUSH YOUR TEETH THE MORNING OF SURGERY WITH YOUR REGULAR TOOTHPASTE  DENTURES WILL BE REMOVED PRIOR TO SURGERY PLEASE DO NOT APPLY Poly grip OR ADHESIVES!!!   Do NOT smoke after Midnight   Stop all vitamins and herbal supplements 7 days before surgery.   Take these medicines the morning of surgery with A SIP OF WATER:  ditropan  if needed, pyridium  if needed   DO NOT TAKE ANY ORAL DIABETIC MEDICATIONS DAY OF YOUR SURGERY  Bring CPAP mask and tubing day of surgery.                              You may not have any metal on your body including hair pins, jewelry, and body piercing             Do not wear make-up, lotions, powders, perfumes/cologne, or deodorant  Do  not wear nail polish including gel and S&S, artificial/acrylic nails, or any other type of covering on natural nails including finger and toenails. If you have artificial nails, gel coating, etc. that needs to be removed by a nail salon please have this removed prior to surgery or surgery may need to be canceled/ delayed if the surgeon/ anesthesia feels like they are unable to be safely monitored.   Do not shave  48 hours prior to surgery.               Men may shave face and neck.   Do not bring valuables to the hospital. [facility] IS NOT             RESPONSIBLE   FOR VALUABLES.   Contacts, glasses, dentures or bridgework may not be worn into surgery.   Bring small overnight bag day of surgery.   DO NOT BRING YOUR  HOME MEDICATIONS TO THE HOSPITAL. PHARMACY WILL DISPENSE MEDICATIONS LISTED ON YOUR MEDICATION LIST TO YOU DURING YOUR ADMISSION IN THE HOSPITAL!    Patients discharged on the day of surgery will not be allowed to drive home.  Someone NEEDS to stay with you for the first 24 hours after anesthesia.   Special Instructions: Bring a copy of your healthcare power of attorney and living will documents the day of surgery if you haven't scanned them before.              Please read over the following fact sheets you were given: IF YOU HAVE QUESTIONS ABOUT YOUR PRE-OP INSTRUCTIONS PLEASE CALL [phone].   If you received a COVID test during your pre-op visit  it is requested that you wear a mask when out in public, stay away from anyone that may not be feeling well and notify your surgeon if you develop symptoms. If you test positive for Covid or have been in contact with anyone that has tested positive in the last 10 days please notify you surgeon.    [facility] - Preparing for Surgery Before surgery, you can play an important role.  Because skin is not sterile, your skin needs to be as free of germs as possible.  You can reduce the number of germs on your skin by washing with CHG  (chlorahexidine gluconate) soap before surgery.  CHG is an antiseptic cleaner which kills germs and bonds with the skin to continue killing germs even after washing. Please DO NOT use if you have an allergy to CHG or antibacterial soaps.  If your skin becomes reddened/irritated stop using the CHG and inform your nurse when you arrive at Short Stay. Do not shave (including legs and underarms) for at least 48 hours prior to the first CHG shower.  You may shave your face/neck. Please follow these instructions carefully:  1.  Shower with CHG Soap the night before surgery and the  morning of Surgery.  2.  If you choose to wash your hair, wash your hair first as usual with your  normal  shampoo.  3.  After you shampoo, rinse your hair and body thoroughly to remove the  shampoo.                           4.  Use CHG as you would any other liquid soap.  You can apply chg directly  to the skin and wash                       Gently with a scrungie or clean washcloth.  5.  Apply the CHG Soap to your body ONLY FROM THE NECK DOWN.   Do not use on face/ open                           Wound or open sores. Avoid contact with eyes, ears mouth and genitals (private parts).                       Wash face,  Genitals (private parts) with your normal soap.             6.  Wash thoroughly, paying special attention to the area where your surgery  will be performed.  7.  Thoroughly rinse your body with warm water from the neck down.  8.  DO NOT shower/wash  with your normal soap after using and rinsing off  the CHG Soap.                9.  Pat yourself dry with a clean towel.            10.  Wear clean pajamas.            11.  Place clean sheets on your bed the night of your first shower and do not  sleep with pets. Day of Surgery : Do not apply any lotions/deodorants the morning of surgery.  Please wear clean clothes to the hospital/surgery center.  FAILURE TO FOLLOW THESE INSTRUCTIONS MAY RESULT IN THE CANCELLATION OF  YOUR SURGERY PATIENT SIGNATURE_________________________________  NURSE SIGNATURE__________________________________  ________________________________________________________________________

## 2024-01-24 ENCOUNTER — Encounter (HOSPITAL_COMMUNITY)
Admission: RE | Admit: 2024-01-24 | Discharge: 2024-01-24 | Disposition: A | Discharge status: Home / Self Care | Source: Ambulatory Visit | Attending: Urology | Admitting: Urology

## 2024-01-24 ENCOUNTER — Other Ambulatory Visit: Payer: Self-pay

## 2024-01-24 ENCOUNTER — Encounter (HOSPITAL_COMMUNITY): Payer: Self-pay

## 2024-01-24 VITALS — BP 160/69 | HR 69 | Temp 98.5°F | Resp 16 | Ht 68.0 in | Wt 131.0 lb

## 2024-01-24 DIAGNOSIS — Z01818 Encounter for other preprocedural examination: Secondary | ICD-10-CM | POA: Diagnosis present

## 2024-01-24 LAB — CBC
HCT: 40.1 % (ref 39.0–52.0)
Hemoglobin: 12.4 g/dL — ABNORMAL LOW (ref 13.0–17.0)
MCH: 30.8 pg (ref 26.0–34.0)
MCHC: 30.9 g/dL (ref 30.0–36.0)
MCV: 99.5 fL (ref 80.0–100.0)
Platelets: 352 K/uL (ref 150–400)
RBC: 4.03 MIL/uL — ABNORMAL LOW (ref 4.22–5.81)
RDW: 12.8 % (ref 11.5–15.5)
WBC: 8.3 K/uL (ref 4.0–10.5)
nRBC: 0 % (ref 0.0–0.2)

## 2024-01-24 LAB — BASIC METABOLIC PANEL WITH GFR
Anion gap: 10 (ref 5–15)
BUN: 18 mg/dL (ref 8–23)
CO2: 30 mmol/L (ref 22–32)
Calcium: 9.7 mg/dL (ref 8.9–10.3)
Chloride: 100 mmol/L (ref 98–111)
Creatinine, Ser: 0.77 mg/dL (ref 0.61–1.24)
GFR, Estimated: >60 mL/min (ref >60)
Glucose, Bld: 137 mg/dL — ABNORMAL HIGH (ref 70–99)
Potassium: 4.6 mmol/L (ref 3.5–5.1)
Sodium: 139 mmol/L (ref 135–145)

## 2024-01-30 NOTE — Anesthesia Preprocedure Evaluation (Signed)
 Anesthesia Evaluation  Patient identified by MRN, date of birth, ID band Patient awake    Reviewed: Allergy & Precautions, NPO status , Patient's Chart, lab work & pertinent test results  History of Anesthesia Complications Negative for: history of anesthetic complications  Airway Mallampati: II  TM Distance: >3 FB Neck ROM: Full   Comment: Previous grade I view with MAC 4, easy mask Dental  (+) Dental Advisory Given, Upper Dentures, Partial Lower   Pulmonary neg shortness of breath, neg sleep apnea, neg COPD, neg recent URI, former smoker   Pulmonary exam normal breath sounds clear to auscultation       Cardiovascular (-) hypertension(-) angina (-) Past MI, (-) Cardiac Stents and (-) CABG (-) dysrhythmias  Rhythm:Regular Rate:Normal  HLD   Neuro/Psych  PSYCHIATRIC DISORDERS Anxiety     negative neurological ROS     GI/Hepatic negative GI ROS, Neg liver ROS,,,  Endo/Other  negative endocrine ROS    Renal/GU negative Renal ROS   Bladder cancer    Musculoskeletal  (+) Arthritis ,    Abdominal   Peds  Hematology  (+) Blood dyscrasia, anemia Lab Results      Component                Value               Date                      WBC                      8.3                 01/24/2024                HGB                      12.4 (L)            01/24/2024                HCT                      40.1                01/24/2024                MCV                      99.5                01/24/2024                PLT                      352                 01/24/2024              Anesthesia Other Findings   Reproductive/Obstetrics                              Anesthesia Physical Anesthesia Plan  ASA: 3  Anesthesia Plan: General   Post-op Pain Management: Tylenol  PO (pre-op)*   Induction: Intravenous  PONV Risk Score and Plan: 2 and Ondansetron , Dexamethasone  and Treatment may vary due  to age or medical condition  Airway Management Planned:   Additional Equipment:   Intra-op Plan:   Post-operative Plan: Extubation in OR  Informed Consent: I have reviewed the patients History and Physical, chart, labs and discussed the procedure including the risks, benefits and alternatives for the proposed anesthesia with the patient or authorized representative who has indicated his/her understanding and acceptance.     Dental advisory given  Plan Discussed with: CRNA and Anesthesiologist  Anesthesia Plan Comments: (Risks of general anesthesia discussed including, but not limited to, sore throat, hoarse voice, chipped/damaged teeth, injury to vocal cords, nausea and vomiting, allergic reactions, lung infection, heart attack, stroke, and death. All questions answered. )         Anesthesia Quick Evaluation

## 2024-01-31 ENCOUNTER — Ambulatory Visit (HOSPITAL_COMMUNITY)
Admission: RE | Admit: 2024-01-31 | Discharge: 2024-01-31 | Disposition: A | Discharge status: Home / Self Care | Source: Ambulatory Visit | Attending: Urology | Admitting: Urology

## 2024-01-31 ENCOUNTER — Ambulatory Visit (HOSPITAL_BASED_OUTPATIENT_CLINIC_OR_DEPARTMENT_OTHER): Payer: Self-pay | Admitting: Anesthesiology

## 2024-01-31 ENCOUNTER — Encounter (HOSPITAL_COMMUNITY): Payer: Self-pay | Admitting: Urology

## 2024-01-31 ENCOUNTER — Ambulatory Visit (HOSPITAL_COMMUNITY)

## 2024-01-31 ENCOUNTER — Other Ambulatory Visit: Payer: Self-pay

## 2024-01-31 ENCOUNTER — Encounter (HOSPITAL_COMMUNITY)
Admission: RE | Disposition: A | Discharge status: Home / Self Care | Payer: Self-pay | Source: Ambulatory Visit | Attending: Urology

## 2024-01-31 ENCOUNTER — Ambulatory Visit (HOSPITAL_COMMUNITY): Payer: Self-pay | Admitting: Medical

## 2024-01-31 DIAGNOSIS — Z87891 Personal history of nicotine dependence: Secondary | ICD-10-CM | POA: Insufficient documentation

## 2024-01-31 DIAGNOSIS — C679 Malignant neoplasm of bladder, unspecified: Secondary | ICD-10-CM | POA: Diagnosis not present

## 2024-01-31 DIAGNOSIS — N35911 Unspecified urethral stricture, male, meatal: Secondary | ICD-10-CM | POA: Diagnosis not present

## 2024-01-31 DIAGNOSIS — F419 Anxiety disorder, unspecified: Secondary | ICD-10-CM

## 2024-01-31 DIAGNOSIS — C674 Malignant neoplasm of posterior wall of bladder: Secondary | ICD-10-CM

## 2024-01-31 DIAGNOSIS — N35912 Unspecified bulbous urethral stricture, male: Secondary | ICD-10-CM | POA: Diagnosis not present

## 2024-01-31 DIAGNOSIS — D649 Anemia, unspecified: Secondary | ICD-10-CM | POA: Diagnosis not present

## 2024-01-31 DIAGNOSIS — N210 Calculus in bladder: Secondary | ICD-10-CM | POA: Diagnosis not present

## 2024-01-31 DIAGNOSIS — D090 Carcinoma in situ of bladder: Secondary | ICD-10-CM | POA: Diagnosis not present

## 2024-01-31 DIAGNOSIS — N35813 Other membranous urethral stricture, male: Secondary | ICD-10-CM | POA: Diagnosis not present

## 2024-01-31 DIAGNOSIS — Z8551 Personal history of malignant neoplasm of bladder: Secondary | ICD-10-CM | POA: Diagnosis not present

## 2024-01-31 DIAGNOSIS — Z01818 Encounter for other preprocedural examination: Secondary | ICD-10-CM

## 2024-01-31 DIAGNOSIS — N200 Calculus of kidney: Secondary | ICD-10-CM | POA: Diagnosis not present

## 2024-01-31 DIAGNOSIS — E785 Hyperlipidemia, unspecified: Secondary | ICD-10-CM | POA: Insufficient documentation

## 2024-01-31 DIAGNOSIS — D494 Neoplasm of unspecified behavior of bladder: Secondary | ICD-10-CM | POA: Diagnosis not present

## 2024-01-31 DIAGNOSIS — M1990 Unspecified osteoarthritis, unspecified site: Secondary | ICD-10-CM | POA: Insufficient documentation

## 2024-01-31 DIAGNOSIS — D49512 Neoplasm of unspecified behavior of left kidney: Secondary | ICD-10-CM | POA: Diagnosis not present

## 2024-01-31 MED ORDER — CEFAZOLIN SODIUM-DEXTROSE 2-4 GM/100ML-% IV SOLN
2.0000 g | INTRAVENOUS | Status: AC
  Administered 2024-01-31: 2 g via INTRAVENOUS
  Filled 2024-01-31: qty 100

## 2024-01-31 MED ORDER — IOHEXOL 300 MG/ML  SOLN
INTRAMUSCULAR | Status: DC | PRN
  Administered 2024-01-31: 10 mL

## 2024-01-31 MED ORDER — LIDOCAINE HCL (PF) 2 % IJ SOLN
INTRAMUSCULAR | Status: AC
  Filled 2024-01-31: qty 5

## 2024-01-31 MED ORDER — CEPHALEXIN 500 MG PO CAPS: 500.0000 mg | ORAL_CAPSULE | Freq: Two times a day (BID) | ORAL | 0 refills | Status: AC

## 2024-01-31 MED ORDER — FENTANYL CITRATE (PF) 50 MCG/ML IJ SOSY: 25.0000 ug | PREFILLED_SYRINGE | INTRAMUSCULAR | Status: DC | PRN

## 2024-01-31 MED ORDER — LIDOCAINE HCL (CARDIAC) PF 100 MG/5ML IV SOSY
PREFILLED_SYRINGE | INTRAVENOUS | Status: DC | PRN
  Administered 2024-01-31: 50 mg via INTRATRACHEAL

## 2024-01-31 MED ORDER — ROCURONIUM BROMIDE 10 MG/ML (PF) SYRINGE
PREFILLED_SYRINGE | INTRAVENOUS | Status: AC
  Filled 2024-01-31: qty 10

## 2024-01-31 MED ORDER — ACETAMINOPHEN 500 MG PO TABS
1000.0000 mg | ORAL_TABLET | Freq: Once | ORAL | Status: AC
  Administered 2024-01-31: 1000 mg via ORAL
  Filled 2024-01-31: qty 2

## 2024-01-31 MED ORDER — ORAL CARE MOUTH RINSE
15.0000 mL | Freq: Once | OROMUCOSAL | Status: AC
Start: 2024-01-31 — End: 2024-01-31

## 2024-01-31 MED ORDER — DEXAMETHASONE SOD PHOSPHATE PF 10 MG/ML IJ SOLN
INTRAMUSCULAR | Status: DC | PRN
  Administered 2024-01-31: 10 mg via INTRAVENOUS

## 2024-01-31 MED ORDER — FENTANYL CITRATE (PF) 100 MCG/2ML IJ SOLN
INTRAMUSCULAR | Status: AC
  Filled 2024-01-31: qty 2

## 2024-01-31 MED ORDER — LACTATED RINGERS IV SOLN: INTRAVENOUS | Status: DC

## 2024-01-31 MED ORDER — ONDANSETRON HCL 4 MG/2ML IJ SOLN
INTRAMUSCULAR | Status: AC
  Filled 2024-01-31: qty 2

## 2024-01-31 MED ORDER — ROCURONIUM BROMIDE 10 MG/ML (PF) SYRINGE
PREFILLED_SYRINGE | INTRAVENOUS | Status: DC | PRN
  Administered 2024-01-31: 50 mg via INTRAVENOUS

## 2024-01-31 MED ORDER — FENTANYL CITRATE (PF) 100 MCG/2ML IJ SOLN
INTRAMUSCULAR | Status: DC | PRN
  Administered 2024-01-31: 50 ug via INTRAVENOUS

## 2024-01-31 MED ORDER — AMISULPRIDE (ANTIEMETIC) 5 MG/2ML IV SOLN
10.0000 mg | Freq: Once | INTRAVENOUS | Status: DC | PRN
Start: 2024-01-31 — End: 2024-01-31

## 2024-01-31 MED ORDER — ONDANSETRON HCL 4 MG/2ML IJ SOLN
INTRAMUSCULAR | Status: DC | PRN
  Administered 2024-01-31: 4 mg via INTRAVENOUS

## 2024-01-31 MED ORDER — SUGAMMADEX SODIUM 200 MG/2ML IV SOLN
INTRAVENOUS | Status: DC | PRN
  Administered 2024-01-31: 200 mg via INTRAVENOUS

## 2024-01-31 MED ORDER — OXYCODONE HCL 5 MG/5ML PO SOLN: 5.0000 mg | Freq: Once | ORAL | Status: DC | PRN

## 2024-01-31 MED ORDER — CHLORHEXIDINE GLUCONATE 0.12 % MT SOLN
15.0000 mL | Freq: Once | OROMUCOSAL | Status: AC
  Administered 2024-01-31: 15 mL via OROMUCOSAL

## 2024-01-31 MED ORDER — SODIUM CHLORIDE 0.9 % IR SOLN
Status: DC | PRN
  Administered 2024-01-31: 3000 mL via INTRAVESICAL

## 2024-01-31 MED ORDER — EPHEDRINE 5 MG/ML INJ
INTRAVENOUS | Status: AC
  Filled 2024-01-31: qty 5

## 2024-01-31 MED ORDER — PROPOFOL 10 MG/ML IV BOLUS
INTRAVENOUS | Status: AC
  Filled 2024-01-31: qty 20

## 2024-01-31 MED ORDER — PROPOFOL 10 MG/ML IV BOLUS
INTRAVENOUS | Status: DC | PRN
  Administered 2024-01-31: 80 mg via INTRAVENOUS

## 2024-01-31 MED ORDER — OXYCODONE HCL 5 MG PO TABS: 5.0000 mg | ORAL_TABLET | Freq: Once | ORAL | Status: DC | PRN

## 2024-01-31 MED ORDER — EPHEDRINE SULFATE-NACL 50-0.9 MG/10ML-% IV SOSY
PREFILLED_SYRINGE | INTRAVENOUS | Status: DC | PRN
  Administered 2024-01-31: 10 mg via INTRAVENOUS

## 2024-01-31 NOTE — Interval H&P Note (Signed)
 History and Physical Interval Note:  01/31/2024 10:03 AM  Jeremy Holmes  has presented today for surgery, with the diagnosis of BLADDER TUMOR, URETHRAL STRICTURE.  The various methods of treatment have been discussed with the patient and family. After consideration of risks, benefits and other options for treatment, the patient has consented to  Procedure(s): CYSTOSCOPY, WITH URETHRAL DILATION (N/A) CYSTOSCOPY, WITH BIOPSY (N/A) CYSTOSCOPY, WITH BLADDER FULGURATION (N/A) CYSTOURETEROSCOPY, WITH STENT INSERTION (Right) as a surgical intervention.  The patient's history has been reviewed, patient examined, no change in status, stable for surgery.  I have reviewed the patient's chart and labs.  Questions were answered to the patient's satisfaction.     Lonni Righter Gwyndolyn Guilford

## 2024-01-31 NOTE — Transfer of Care (Signed)
 Immediate Anesthesia Transfer of Care Note  Patient: Jeremy Holmes  Procedure(s) Performed: CYSTOSCOPY, WITH URETHRAL DILATION CYSTOSCOPY, WITH BIOPSY CYSTOSCOPY, WITH BLADDER FULGURATION CYSTOURETEROSCOPY, WITH STENT INSERTION (Right)  Patient Location: PACU  Anesthesia Type:General  Level of Consciousness: sedated  Airway & Oxygen Therapy: Patient Spontanous Breathing and Patient connected to face mask oxygen  Post-op Assessment: Report given to RN and Post -op Vital signs reviewed and stable  Post vital signs: Reviewed and stable  Last Vitals:  Vitals Value Taken Time  BP    Temp    Pulse 57 01/31/24 11:22  Resp 11 01/31/24 11:22  SpO2 100 % 01/31/24 11:22  Vitals shown include unfiled device data.  Last Pain:  Vitals:   01/31/24 0824  TempSrc:   PainSc: 0-No pain         Complications: No notable events documented.

## 2024-01-31 NOTE — Anesthesia Procedure Notes (Signed)
 Procedure Name: Intubation Date/Time: 01/31/2024 10:39 AM  Performed by: Carleton Garnette SAUNDERS, CRNAPre-anesthesia Checklist: Patient identified, Emergency Drugs available, Suction available and Patient being monitored Patient Re-evaluated:Patient Re-evaluated prior to induction Oxygen Delivery Method: Circle system utilized Preoxygenation: Pre-oxygenation with 100% oxygen Induction Type: IV induction Ventilation: Mask ventilation without difficulty Laryngoscope Size: Mac and 4 Grade View: Grade I Tube type: Oral Tube size: 7.5 mm Number of attempts: 1 Airway Equipment and Method: Stylet and Oral airway Placement Confirmation: ETT inserted through vocal cords under direct vision, positive ETCO2 and breath sounds checked- equal and bilateral Secured at: 21 cm Tube secured with: Tape Dental Injury: Teeth and Oropharynx as per pre-operative assessment

## 2024-01-31 NOTE — H&P (Signed)
 Office Visit Report     12/25/2023   --------------------------------------------------------------------------------   Maude IVAR Holy  MRN: [id number]  DOB: Feb 16, 1934, 89 year old Male  SSN:    PRIMARY CARE:  Lamar POUR. Frederik mast), MD  PRIMARY CARE FAX:  [phone]  REFERRING:  Ubaldo FABIENE Eagles, NP  PROVIDER:  Lonni Han, M.D.  LOCATION:  [facility] [phone]     --------------------------------------------------------------------------------   CC/HPI: Bladder cancer   Mr. Mcconville is an 89 year old male with high-grade Ta urothelial carcinoma of the bladder, s/p TURBT with gemcitabine  instillation and right ureteral stent placement on 08/30/2023.   09/07/2023: The patient is here today for routine postop visit. His postoperative course was complicated by persistent hematuria following his TURBT and stent placement. He was seen in the emergency department on Sunday due to catheter obstruction and required Foley irrigation due to residual clot burden. Today, the patient's catheter is draining maroon-tinged urine with no clots.   11/03/2023: Patient presented yesterday for BCG. Nursing staff attempted to place Foley catheter x 3 and was unsuccessful. Further attempts at catheter placement were aborted. Patient tells me he has been voiding without significant difficulty. He denies changes in force of stream, pain or burning with urination, increased frequency/urgency or baseline. He has had no recent gross hematuria. UA and PVR today both reassuring.   12/25/23: The patient is here today for a routine follow-up and surveillance cystoscopy. Above hx noted. He was unable to complete induction BCG due to difficulty inserting a Foley for treatment. From urinary aspect, the patient reports a good force of stream and feels like he is emptying his bladder well. He denies interval UTIs, dysuria or hematuria.     ALLERGIES: No Allergies    MEDICATIONS: Aspirin 81   Atorvastatin  Calcium  20 MG Tablet  Cinnamon  Fish Oil  Iron  Magnesium   Multivitamin  Vitamin C     GU PSH: Cysto Remove Stent FB Sim - 09/07/2023 Initial Male VB Sounds - 09/15/2023       PSH Notes: septic knee   NON-GU PSH: Hernia Repair Rotator cuff surgery Visit Complexity (formerly GPC1X) - 11/03/2023, 09/15/2023     GU PMH: Bladder Cancer Posterior - 11/03/2023, - 11/02/2023, - 10/19/2023, - 09/07/2023 Bladder tumor/neoplasm - 10/19/2023, - 08/10/2023 Gross hematuria - 09/15/2023, - 09/07/2023      PMH Notes: Musculoskeletal and Integument  Unilateral primary osteoarthritis, right knee  Baker cyst, right  Septic arthritis of knee, right (HCC)  Pyogenic arthritis of right knee joint (HCC)   Other   Pes planus of left foot  Generalized anxiety disorder  Adjustment disorder with depressed mood  Medication monitoring encounter  Early stage nonexudative age-related macular degeneration of both eyes  History of vitrectomy  Right inguinal hernia  Iron deficiency anemia due to chronic blood loss     NON-GU PMH: Other urethral stricture, male, meatal - 10/19/2023, - 09/15/2023, - 09/07/2023 Arthritis Skin Cancer, History    FAMILY HISTORY: 2 daughters - Daughter 1 son - Son   SOCIAL HISTORY: Marital Status: Married Preferred Language: English; Race: White Current Smoking Status: Patient does not smoke anymore.   Tobacco Use Assessment Completed: Used Tobacco in last 30 days? Has never drank.  Drinks 1 caffeinated drink per day.    REVIEW OF SYSTEMS:    GU Review Male:   Patient denies frequent urination, hard to postpone urination, burning/ pain with urination, get up at night to urinate, leakage of urine, stream starts and stops,  trouble starting your stream, have to strain to urinate , erection problems, and penile pain.  Gastrointestinal (Upper):   Patient denies nausea, vomiting, and indigestion/ heartburn.  Gastrointestinal (Lower):   Patient denies diarrhea and  constipation.  Constitutional:   Patient denies fever, night sweats, weight loss, and fatigue.  Skin:   Patient denies skin rash/ lesion and itching.  Eyes:   Patient denies blurred vision and double vision.  Ears/ Nose/ Throat:   Patient denies sore throat and sinus problems.  Hematologic/Lymphatic:   Patient denies swollen glands and easy bruising.  Cardiovascular:   Patient denies leg swelling and chest pains.  Respiratory:   Patient denies shortness of breath and cough.  Endocrine:   Patient denies excessive thirst.  Musculoskeletal:   Patient denies back pain and joint pain.  Neurological:   Patient denies headaches and dizziness.  Psychologic:   Patient denies depression and anxiety.   VITAL SIGNS: None   GU PHYSICAL EXAMINATION:    Urethral Meatus: Normal size. No lesion, no wart, no discharge, no polyp. Normal location.  Penis: Circumcised, no warts, no cracks. No dorsal Peyronie's plaques, no left corporal Peyronie's plaques, no right corporal Peyronie's plaques, no scarring, no warts. No balanitis, no meatal stenosis.   MULTI-SYSTEM PHYSICAL EXAMINATION:    Constitutional: Well-nourished. No physical deformities. Normally developed. Good grooming.  Neck: Neck symmetrical, not swollen. Normal tracheal position.  Respiratory: No labored breathing, no use of accessory muscles.   Skin: No paleness, no jaundice, no cyanosis. No lesion, no ulcer, no rash.  Neurologic / Psychiatric: Oriented to time, oriented to place, oriented to person. No depression, no anxiety, no agitation.  Musculoskeletal: Normal gait and station of head and neck.     PAST DATA REVIEW: None   PROCEDURES:         Flexible Cystoscopy - 52000  Risks, benefits, and some of the potential complications of the procedure were discussed at length with the patient including infection, bleeding, voiding discomfort, urinary retention, fever, chills, sepsis, and others. All questions were answered. Informed consent was  obtained. Antibiotic prophylaxis was given. Sterile technique and intraurethral analgesia were used.  Meatus:  Normal size. Normal location. Normal condition.  Urethra:  Mild fossa navicularis stricture. Moderate membranous stricture.  External Sphincter:  Normal.  Verumontanum:  Normal.  Prostate:  Non-obstructing. No hyperplasia.  Bladder Neck:  Non-obstructing.  Ureteral Orifices:  Normal location. Normal size. Normal shape. Effluxed clear urine.  Bladder:  Severe trabeculation. < 1/2 cm tumor overlying the right UO. Normal mucosa. No stones.      The lower urinary tract was carefully examined. The procedure was well-tolerated and without complications. Antibiotic instructions were given. Instructions were given to call the office immediately for bloody urine, difficulty urinating, urinary retention, painful or frequent urination, fever, chills, nausea, vomiting or other illness. The patient stated that he understood these instructions and would comply with them.          Urethral Dilation Sounds Initial - 46399  Risks, benefits, and some of the potential complications of the procedure were discussed at length with the patient including infection, bleeding, voiding discomfort, urinary retention, fever, chills, sepsis, and others. All questions were answered. Informed consent was obtained. Antibiotic prophylaxis was given. Sterile technique and intraurethral analgesia were used.  Urethra:  No strictures.    Fleeta Needs Sounds were used to dilate the stricture from 48 French to 20 French.  The lower urinary tract was carefully examined. The procedure was well-tolerated and without  complications. Antibiotic instructions were given. Instructions were given to call the office immediately for bloody urine, difficulty urinating, urinary retention, painful or frequent urination, fever, chills, nausea, vomiting or other illness. The patient stated that he understood these instructions and would comply  with them.         Visit Complexity - G2211          Urinalysis Dipstick Dipstick Cont'd  Color: Yellow Bilirubin: Neg mg/dL  Appearance: Clear Ketones: Neg mg/dL  Specific Gravity: 8.984 Blood: Neg ery/uL  pH: 7.5 Protein: Neg mg/dL  Glucose: Neg mg/dL Urobilinogen: 0.2 mg/dL    Nitrites: Neg    Leukocyte Esterase: Neg leu/uL    ASSESSMENT:      ICD-10 Details  1 GU:   Bladder Cancer Posterior - C67.4 Chronic, Stable  2 NON-GU:   Other membranous urethral stricture, male - N35.813 Undiagnosed New Problem   PLAN:           Schedule Return Visit/Planned Activity: Next Available Appointment - Schedule Surgery          Document Letter(s):  Created for Robert K. Frederik mast), MD   Created for Patient: Clinical Summary         Notes:   - Cystoscopy today revealed a 3 to 4 mm papillary bladder tumor overlying the right ureteral orifice along with a mild fossa navicularis stricture and a moderate diminished urethral stricture that was easily bypassed with the 16 French flexible scope.   The risks, benefits and alternatives of cystoscopy with balloon dilation of urethral stricture, bladder biopsy with fulguration and possible right ureteral stent placement was discussed with the patient. The risks include, but are not limited to, bleeding, urinary tract infection, bladder perforation requiring prolonged catheterization and/or open bladder repair, ureteral obstruction, voiding dysfunction and the inherent risks of general anesthesia. The patient voices understanding and wishes to proceed.

## 2024-01-31 NOTE — Anesthesia Postprocedure Evaluation (Signed)
 Anesthesia Post Note  Patient: Jeremy Holmes  Procedure(s) Performed: CYSTOSCOPY, WITH URETHRAL DILATION CYSTOSCOPY, WITH BIOPSY CYSTOSCOPY, WITH BLADDER FULGURATION CYSTOURETEROSCOPY, WITH STENT INSERTION (Right)     Patient location during evaluation: PACU Anesthesia Type: General Level of consciousness: awake Pain management: pain level controlled Vital Signs Assessment: post-procedure vital signs reviewed and stable Respiratory status: spontaneous breathing, nonlabored ventilation and respiratory function stable Cardiovascular status: blood pressure returned to baseline and stable Postop Assessment: no apparent nausea or vomiting Anesthetic complications: no   No notable events documented.  Last Vitals:  Vitals:   01/31/24 1215 01/31/24 1226  BP: (!) 169/63 (!) (P) 183/60  Pulse: (!) 59   Resp: (!) 21 (P) 17  Temp: (!) 36.3 C   SpO2: 100%     Last Pain:  Vitals:   01/31/24 1226  TempSrc:   PainSc: (P) 0-No pain                 Delon Aisha Arch

## 2024-01-31 NOTE — Op Note (Signed)
 Operative Note  Preoperative diagnosis:  1.  5 mm papillary bladder tumor involving the right ureteral orifice/ureteral sheath 2.  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  Meatal stenosis 4.  Bulbar urethral stricture  Postoperative diagnosis:  Same  Procedure(s): 1.  Cystoscopy with balloon dilation of urethral stricture 2.  TURBT (small) 3.  Right ureteral stent placement  Surgeon: Lonni Han, MD  Assistants:  None  Anesthesia:  General  Complications:  None  EBL: Less than 5 mL  Specimens: 1.  5 mm papillary bladder tumor involving the right UO  Drains/Catheters: 1.  Right 6 French, 24 cm JJ stent with tether 2.  20 French two-way Foley catheter (the tether the stent is secured to the Foley catheter)  Intraoperative findings:   Meatal stenosis Thin bulbar urethral stricture 5 mm papillary bladder tumor involving the right ureteral orifice/ureteral sheath  Indication:  Jeremy Holmes is a 89 y.o. male with a history of high-grade TA urothelial carcinoma of the bladder.  He was found to have a 5 mm papillary bladder tumor recurrence involving the right ureteral orifice/ureteral sheath on surveillance cystoscopy from 12/25/2023.  He was also noted to have meatal stenosis and a thin bulbar urethral stricture during that cystoscopy.  He has been consented for the above procedures, voices understanding and wishes to proceed.  Description of procedure:  After informed consent was obtained, the patient was brought to the operating room and general endotracheal anesthesia was administered. The patient was then placed in the dorsolithotomy position and prepped and draped in the usual sterile fashion. A timeout was performed. A 21 French rigid cystoscope was then inserted into the urethral meatus and advanced until his bulbar urethral stricture was encountered.  A sensor wire was then advanced through the aperture of the stricture and into the bladder,  confirming wire placement via fluoroscopy.  A 26 French balloon dilator was then advanced over the sensor wire and into position within the urethra, confirming placement via fluoroscopy.  The balloon dilator was then inflated up to 20 atm with no evidence of waist seen on fluoroscopy.  Balloon dilator was left in place for several seconds and then deflated.  The rigid ureteroscope was then advanced into the urethra and into the bladder where his 5 mm papillary bladder tumor involving the right ureteral orifice/ureteral sheath was identified.  No additional tumor burden was seen following bladder survey.  A Glidewire was then advanced up the right ureter to the right renal pelvis, confirming placement via fluoroscopy.  A 26 French resectoscope with a bipolar loop working element was then inserted into the bladder and his bladder tumor was resected and sent for permanent section.  The area of resection was then extensively fulgurated until hemostasis was achieved.  Due to direct involvement of the right ureteral/ureteral sheath, made a decision to place a ureteral stent.  A 6 French, 24 cm JJ stent was then advanced over the Glidewire and into good position within the right collecting system, confirming placement via fluoroscopy.  The tether of the stent was left intact.  A council tip catheter was then advanced over the sensor wire and into the bladder with return of clear irrigant.  The Foley catheter balloon was then inflated with 10 mL of sterile water.  The tether the stent was then secured to the Foley catheter.  Fluoroscopy was utilized to confirm good stent placement and Foley catheter placement.  The Foley catheter was then placed to gravity drainage.  The patient tolerated the procedure well and was transferred to the postanesthesia in stable condition.  Plan: Follow-up in 1 week to have a voiding trial and to remove his Foley catheter and ureteral stent in tandem.

## 2024-02-01 ENCOUNTER — Encounter (HOSPITAL_COMMUNITY): Payer: Self-pay | Admitting: Urology

## 2024-02-01 DIAGNOSIS — Z905 Acquired absence of kidney: Secondary | ICD-10-CM | POA: Diagnosis not present

## 2024-02-01 DIAGNOSIS — Z8554 Personal history of malignant neoplasm of ureter: Secondary | ICD-10-CM | POA: Diagnosis not present

## 2024-02-01 DIAGNOSIS — N184 Chronic kidney disease, stage 4 (severe): Secondary | ICD-10-CM | POA: Diagnosis not present

## 2024-02-01 LAB — SURGICAL PATHOLOGY

## 2024-02-07 DIAGNOSIS — N35811 Other urethral stricture, male, meatal: Secondary | ICD-10-CM | POA: Diagnosis not present

## 2024-02-07 DIAGNOSIS — C679 Malignant neoplasm of bladder, unspecified: Secondary | ICD-10-CM | POA: Diagnosis not present

## 2024-02-15 DIAGNOSIS — L080 Pyoderma: Secondary | ICD-10-CM | POA: Diagnosis not present

## 2024-02-15 DIAGNOSIS — D485 Neoplasm of uncertain behavior of skin: Secondary | ICD-10-CM | POA: Diagnosis not present

## 2024-02-15 DIAGNOSIS — L568 Other specified acute skin changes due to ultraviolet radiation: Secondary | ICD-10-CM | POA: Diagnosis not present

## 2024-02-15 DIAGNOSIS — L570 Actinic keratosis: Secondary | ICD-10-CM | POA: Diagnosis not present

## 2024-02-15 DIAGNOSIS — D0422 Carcinoma in situ of skin of left ear and external auricular canal: Secondary | ICD-10-CM | POA: Diagnosis not present

## 2024-02-15 DIAGNOSIS — D044 Carcinoma in situ of skin of scalp and neck: Secondary | ICD-10-CM | POA: Diagnosis not present

## 2024-02-29 DIAGNOSIS — L570 Actinic keratosis: Secondary | ICD-10-CM | POA: Diagnosis not present

## 2024-02-29 DIAGNOSIS — D049 Carcinoma in situ of skin, unspecified: Secondary | ICD-10-CM | POA: Diagnosis not present

## 2024-04-20 ENCOUNTER — Other Ambulatory Visit: Payer: Self-pay

## 2024-04-20 ENCOUNTER — Emergency Department (HOSPITAL_COMMUNITY)

## 2024-04-20 ENCOUNTER — Observation Stay (HOSPITAL_COMMUNITY)
Admission: EM | Admit: 2024-04-20 | Discharge: 2024-04-24 | DRG: 359 | Disposition: A | Attending: Cardiology | Admitting: Cardiology

## 2024-04-20 ENCOUNTER — Observation Stay (HOSPITAL_COMMUNITY)

## 2024-04-20 DIAGNOSIS — Z955 Presence of coronary angioplasty implant and graft: Secondary | ICD-10-CM

## 2024-04-20 DIAGNOSIS — R4182 Altered mental status, unspecified: Secondary | ICD-10-CM | POA: Diagnosis not present

## 2024-04-20 DIAGNOSIS — Z79899 Other long term (current) drug therapy: Secondary | ICD-10-CM

## 2024-04-20 DIAGNOSIS — F32A Depression, unspecified: Secondary | ICD-10-CM | POA: Diagnosis present

## 2024-04-20 DIAGNOSIS — I2510 Atherosclerotic heart disease of native coronary artery without angina pectoris: Secondary | ICD-10-CM | POA: Diagnosis present

## 2024-04-20 DIAGNOSIS — C679 Malignant neoplasm of bladder, unspecified: Secondary | ICD-10-CM | POA: Diagnosis present

## 2024-04-20 DIAGNOSIS — R7303 Prediabetes: Secondary | ICD-10-CM | POA: Diagnosis present

## 2024-04-20 DIAGNOSIS — I5021 Acute systolic (congestive) heart failure: Secondary | ICD-10-CM | POA: Diagnosis present

## 2024-04-20 DIAGNOSIS — Z7982 Long term (current) use of aspirin: Secondary | ICD-10-CM

## 2024-04-20 DIAGNOSIS — F419 Anxiety disorder, unspecified: Secondary | ICD-10-CM | POA: Diagnosis present

## 2024-04-20 DIAGNOSIS — E785 Hyperlipidemia, unspecified: Secondary | ICD-10-CM | POA: Diagnosis present

## 2024-04-20 DIAGNOSIS — Z634 Disappearance and death of family member: Secondary | ICD-10-CM

## 2024-04-20 DIAGNOSIS — R4701 Aphasia: Secondary | ICD-10-CM | POA: Diagnosis not present

## 2024-04-20 DIAGNOSIS — I214 Non-ST elevation (NSTEMI) myocardial infarction: Principal | ICD-10-CM | POA: Diagnosis present

## 2024-04-20 DIAGNOSIS — Z8701 Personal history of pneumonia (recurrent): Secondary | ICD-10-CM

## 2024-04-20 DIAGNOSIS — Z66 Do not resuscitate: Secondary | ICD-10-CM | POA: Diagnosis present

## 2024-04-20 DIAGNOSIS — Z87891 Personal history of nicotine dependence: Secondary | ICD-10-CM

## 2024-04-20 DIAGNOSIS — I249 Acute ischemic heart disease, unspecified: Principal | ICD-10-CM

## 2024-04-20 DIAGNOSIS — M1711 Unilateral primary osteoarthritis, right knee: Secondary | ICD-10-CM | POA: Diagnosis present

## 2024-04-20 DIAGNOSIS — Z8551 Personal history of malignant neoplasm of bladder: Secondary | ICD-10-CM

## 2024-04-20 DIAGNOSIS — R000 Tachycardia, unspecified: Secondary | ICD-10-CM | POA: Diagnosis not present

## 2024-04-20 LAB — CBC
HCT: 36.9 % — ABNORMAL LOW (ref 39.0–52.0)
Hemoglobin: 12.1 g/dL — ABNORMAL LOW (ref 13.0–17.0)
MCH: 32.3 pg (ref 26.0–34.0)
MCHC: 32.8 g/dL (ref 30.0–36.0)
MCV: 98.4 fL (ref 80.0–100.0)
Platelets: 350 10*3/uL (ref 150–400)
RBC: 3.75 MIL/uL — ABNORMAL LOW (ref 4.22–5.81)
RDW: 13.7 % (ref 11.5–15.5)
WBC: 9.2 10*3/uL (ref 4.0–10.5)
nRBC: 0 % (ref 0.0–0.2)

## 2024-04-20 LAB — BASIC METABOLIC PANEL WITH GFR
Anion gap: 12 (ref 5–15)
BUN: 23 mg/dL (ref 8–23)
CO2: 26 mmol/L (ref 22–32)
Calcium: 9.4 mg/dL (ref 8.9–10.3)
Chloride: 100 mmol/L (ref 98–111)
Creatinine, Ser: 0.88 mg/dL (ref 0.61–1.24)
GFR, Estimated: >60 mL/min
Glucose, Bld: 141 mg/dL — ABNORMAL HIGH (ref 70–99)
Potassium: 4.1 mmol/L (ref 3.5–5.1)
Sodium: 139 mmol/L (ref 135–145)

## 2024-04-20 LAB — TROPONIN T, HIGH SENSITIVITY
Troponin T High Sensitivity: 119 ng/L (ref 0–19)
Troponin T High Sensitivity: 191 ng/L (ref 0–19)

## 2024-04-20 LAB — LIPID PANEL
Cholesterol: 117 mg/dL (ref 0–200)
HDL: 44 mg/dL
LDL Cholesterol: 60 mg/dL (ref 0–99)
Total CHOL/HDL Ratio: 2.7 ratio
Triglycerides: 66 mg/dL
VLDL: 13 mg/dL (ref 0–40)

## 2024-04-20 LAB — HEPARIN LEVEL (UNFRACTIONATED)
Heparin Unfractionated: <0.1 [IU]/mL — ABNORMAL LOW (ref 0.30–0.70)
Heparin Unfractionated: 0.1 [IU]/mL — ABNORMAL LOW (ref 0.30–0.70)

## 2024-04-20 MED ORDER — PHENAZOPYRIDINE HCL 200 MG PO TABS: 200.0000 mg | ORAL_TABLET | Freq: Three times a day (TID) | ORAL | Status: DC | PRN

## 2024-04-20 MED ORDER — ATORVASTATIN CALCIUM 40 MG PO TABS
40.0000 mg | ORAL_TABLET | Freq: Every day | ORAL | Status: DC
  Administered 2024-04-20 – 2024-04-24 (×4): 40 mg via ORAL
  Filled 2024-04-20 (×4): qty 1

## 2024-04-20 MED ORDER — HEPARIN (PORCINE) 25000 UT/250ML-% IV SOLN
1150.0000 [IU]/h | INTRAVENOUS | Status: DC
  Administered 2024-04-20: 750 [IU]/h via INTRAVENOUS
  Administered 2024-04-21: 1150 [IU]/h via INTRAVENOUS
  Filled 2024-04-20 (×3): qty 250

## 2024-04-20 MED ORDER — HEPARIN BOLUS VIA INFUSION
2000.0000 [IU] | Freq: Once | INTRAVENOUS | Status: AC
  Administered 2024-04-20: 2000 [IU] via INTRAVENOUS
  Filled 2024-04-20: qty 2000

## 2024-04-20 MED ORDER — ONDANSETRON HCL 4 MG/2ML IJ SOLN: 4.0000 mg | Freq: Four times a day (QID) | INTRAMUSCULAR | Status: DC | PRN

## 2024-04-20 MED ORDER — OXYBUTYNIN CHLORIDE 5 MG PO TABS: 5.0000 mg | ORAL_TABLET | Freq: Three times a day (TID) | ORAL | Status: DC | PRN

## 2024-04-20 MED ORDER — NITROGLYCERIN 0.4 MG SL SUBL
0.4000 mg | SUBLINGUAL_TABLET | SUBLINGUAL | Status: DC | PRN
  Administered 2024-04-20 – 2024-04-22 (×4): 0.4 mg via SUBLINGUAL
  Filled 2024-04-20 (×4): qty 1

## 2024-04-20 MED ORDER — METOPROLOL TARTRATE 12.5 MG HALF TABLET
12.5000 mg | ORAL_TABLET | Freq: Two times a day (BID) | ORAL | Status: DC
  Administered 2024-04-20 – 2024-04-23 (×6): 12.5 mg via ORAL
  Filled 2024-04-20 (×6): qty 1

## 2024-04-20 MED ORDER — ASPIRIN 81 MG PO TBEC
81.0000 mg | DELAYED_RELEASE_TABLET | Freq: Every day | ORAL | Status: DC
  Administered 2024-04-20 – 2024-04-24 (×4): 81 mg via ORAL
  Filled 2024-04-20 (×4): qty 1

## 2024-04-20 MED ORDER — ASPIRIN 81 MG PO CHEW: 324.0000 mg | CHEWABLE_TABLET | Freq: Once | ORAL | Status: DC

## 2024-04-20 MED ORDER — ZOLPIDEM TARTRATE 5 MG PO TABS
2.5000 mg | ORAL_TABLET | Freq: Once | ORAL | Status: AC
  Administered 2024-04-20: 2.5 mg via ORAL
  Filled 2024-04-20: qty 1

## 2024-04-20 MED ORDER — ACETAMINOPHEN 325 MG PO TABS: 650.0000 mg | ORAL_TABLET | ORAL | Status: DC | PRN

## 2024-04-20 MED ORDER — HEPARIN BOLUS VIA INFUSION
3000.0000 [IU] | Freq: Once | INTRAVENOUS | Status: AC
  Administered 2024-04-20: 3000 [IU] via INTRAVENOUS
  Filled 2024-04-20: qty 3000

## 2024-04-20 NOTE — Consult Note (Addendum)
 "  Cardiology Consultation   Patient ID: Jeremy Holmes MRN: [id number]; DOB: September 14, 1933  Admit date: 04/20/2024 Date of Consult: 04/20/2024  PCP:  Frederik Charleston, MD   [facility] Providers Cardiologist:  None        Patient Profile: Jeremy Holmes is a 89 y.o. male with a hx of bladder cancer s/p BCG therapy who is being seen 04/20/2024 for the evaluation of chest pain at the request of [facility].  History of Present Illness: [name] presented this evening with 3 days of waxing and waning chest pain with associated dyspnea. He has never had these symptoms prior to this. The pain is substernal with no radiation with some associated dyspnea. He denies any pleuritic component to his pain. He characterizes it as pressure. The first two nights he felt it was indigestion as he was eating some snacks after dinner. Tonight he avoided this but still had the same pain. The pain lasted all night and it kept him awake for a lot of the night. He had no pain during the day. He is currently chest pain free. No LE edema but with mild orthopnea.  His wife recently passed away within the past month. He lives alone and feels he can take care of himself. He has a daughter that lives 25 miles from him.   We discussed CODE STATUS and he relayed to me that he did not wish to have advanced measures in the event that he had a life-threatening heart rhythm or respiratory failure.  Therefore I have placed the order for DO NOT RESUSCITATE with no chest compressions or intubation as is consistent with his wishes.  Past Medical History:  Diagnosis Date   Anemia    Anxiety    no meds   Arthritis    right knee   Cancer (HCC)    skin: head,nose,back, bladder cancer   HLD (hyperlipidemia)    Pneumonia    Pre-diabetes    diet controlled - no meds    Past Surgical History:  Procedure Laterality Date   COLONOSCOPY     CYSTOSCOPY WITH BIOPSY N/A 01/31/2024   Procedure: CYSTOSCOPY, WITH BIOPSY;   Surgeon: Devere Lonni Righter, MD;  Location: [facility];  Service: Urology;  Laterality: N/A;   CYSTOSCOPY WITH FULGERATION N/A 01/31/2024   Procedure: CYSTOSCOPY, WITH BLADDER FULGURATION;  Surgeon: Devere Lonni Righter, MD;  Location: [facility];  Service: Urology;  Laterality: N/A;   CYSTOSCOPY WITH URETEROSCOPY AND STENT PLACEMENT Right 01/31/2024   Procedure: CYSTOURETEROSCOPY, WITH STENT INSERTION;  Surgeon: Devere Lonni Righter, MD;  Location: [facility];  Service: Urology;  Laterality: Right;   CYSTOSCOPY WITH URETHRAL DILATATION N/A 01/31/2024   Procedure: CYSTOSCOPY, WITH URETHRAL DILATION;  Surgeon: Devere Lonni Righter, MD;  Location: [facility];  Service: Urology;  Laterality: N/A;   ELBOW SURGERY Right    EYE SURGERY Bilateral    cataracts surgery   INGUINAL HERNIA REPAIR Right 04/27/2021   Procedure: OPEN REPAIR RIGHT INGUINAL HERNIA WITH MESH;  Surgeon: Eletha Boas, MD;  Location: [facility];  Service: General;  Laterality: Right;   KNEE ARTHROSCOPY Right 08/20/2020   Procedure: ARTHROSCOPY KNEE AND IRRIGATION AND DEBRIDEMENT;  Surgeon: Jerri Kay HERO, MD;  Location: [facility];  Service: Orthopedics;  Laterality: Right;   SHOULDER SURGERY Bilateral    x 2   WISDOM TOOTH EXTRACTION       Home Medications:  Prior to Admission medications  Medication Sig Start Date End Date  Taking? Authorizing Provider  acetaminophen  (TYLENOL ) 500 MG tablet Take 1,000 mg by mouth 2 (two) times daily.    [provider]  aspirin  EC 81 MG tablet Take 81 mg by mouth daily. Swallow whole.    [provider]  atorvastatin  (LIPITOR) 20 MG tablet Take 20 mg by mouth daily.    [provider]  CINNAMON PO Take 2,000 mg by mouth daily. 1000 mg each    [provider]  ferrous sulfate 325 (65 FE) MG EC tablet Take 325 mg by mouth daily with breakfast.    [provider]  Lidocaine  HCl (ASPERCREME LIDOCAINE ) 4 % CREA Apply 1 application  topically  2 (two) times daily. Back pain    [provider]  Magnesium  250 MG TABS Take 250 mg by mouth daily.    [provider]  Multiple Vitamins-Minerals (CENTRUM SILVER PO) Take 1 tablet by mouth daily.    [provider]  Omega-3 Fatty Acids (FISH OIL PO) Take 2,000 mg by mouth daily.    [provider]  ondansetron  (ZOFRAN ) 4 MG tablet Take 1 tablet (4 mg total) by mouth daily as needed for nausea or vomiting. Patient not taking: Reported on 01/22/2024 09/01/23 08/31/24  Devere Lonni Righter, MD  oxybutynin  (DITROPAN ) 5 MG tablet Take 1 tablet (5 mg total) by mouth every 8 (eight) hours as needed for bladder spasms. Patient not taking: Reported on 01/22/2024 08/30/23   Devere Lonni Righter, MD  phenazopyridine  (PYRIDIUM ) 200 MG tablet Take 1 tablet (200 mg total) by mouth 3 (three) times daily as needed (for pain with urination). Patient not taking: Reported on 01/22/2024 08/30/23 08/29/24  Devere Lonni Righter, MD  ZINC-VITAMIN C PO Take 1 tablet by mouth daily. Vitamin D    [provider]    Scheduled Meds:  aspirin   324 mg Oral Once   Continuous Infusions:  heparin  750 Units/hr (04/20/24 0251)   PRN Meds: nitroGLYCERIN   Allergies:   Allergies[1]  Social History:   Social History   Socioeconomic History   Marital status: Married    Spouse name: Not on file   Number of children: Not on file   Years of education: Not on file   Highest education level: Not on file  Occupational History   Not on file  Tobacco Use   Smoking status: Former    Types: Cigarettes   Smokeless tobacco: Never  Vaping Use   Vaping status: Never Used  Substance and Sexual Activity   Alcohol use: Not Currently    Alcohol/week: 7.0 standard drinks of alcohol    Types: 7 Glasses of wine per week    Comment: occ at night with supper   Drug use: No   Sexual activity: Not on file  Other Topics Concern   Not on file  Social History Narrative   Not on  file   Social Drivers of Health   Tobacco Use: Medium Risk (04/18/2024)   Received from [facility]   Patient History    Smoking Tobacco Use: Former    Smokeless Tobacco Use: Never    Passive Exposure: Never  Physicist, Medical Strain: Low Risk (04/18/2024)   Received from [facility]   Overall Financial Resource Strain (CARDIA)    How hard is it for you to pay for the very basics like food, housing, medical care, and heating?: Not hard at all  Food Insecurity: No Food Insecurity (04/18/2024)   Received from [facility]  Within the past 12 months, you worried that your food would run out before you got the money to buy more.: Never true    Within the past 12 months, the food you bought just didn't last and you didn't have money to get more.: Never true  Transportation Needs: No Transportation Needs (04/18/2024)   Received from [facility]    In the past 12 months, has lack of transportation kept you from medical appointments or from getting medications?: No    In the past 12 months, has lack of transportation kept you from meetings, work, or from getting things needed for daily living?: No  Physical Activity: Not on file  Stress: Not on file  Social Connections: Unknown (08/30/2023)   Social Connection and Isolation Panel    Frequency of Communication with Friends and Family: More than three times a week    Frequency of Social Gatherings with Friends and Family: More than three times a week    Attends Religious Services: More than 4 times per year    Active Member of [employer] or Organizations: Patient unable to answer    Attends Banker Meetings: Patient unable to answer    Marital Status: Married  Catering Manager Violence: Not At Risk (08/30/2023)   Humiliation, Afraid, Rape, and Kick questionnaire    Fear of Current or Ex-Partner: No    Emotionally Abused: No    Physically Abused: No    Sexually Abused: No  Depression (PHQ2-9): Not on file   Alcohol Screen: Not on file  Housing: Low Risk (04/18/2024)   Received from [facility]    In the last 12 months, was there a time when you were not able to pay the mortgage or rent on time?: No    In the past 12 months, how many times have you moved where you were living?: 0    At any time in the past 12 months, were you homeless or living in a shelter (including now)?: No  Utilities: Not At Risk (04/18/2024)   Received from [facility]    In the past 12 months has the electric, gas, oil, or water  company threatened to shut off services in your home?: No  Health Literacy: Not on file    Family History:   Cardiac history  ROS:  Please see the history of present illness.   All other ROS reviewed and negative.     Physical Exam/Data: Vitals:   04/20/24 0116 04/20/24 0117  BP: (!) 177/93   Pulse: 85   Resp: 16   Temp: 97.8 F (36.6 C)   TempSrc: Oral   SpO2: 100%   Weight:  61.2 kg  Height:  5' 8 (1.727 m)   No intake or output data in the 24 hours ending 04/20/24 0337    04/20/2024    1:17 AM 01/31/2024    8:24 AM 01/24/2024    1:22 PM  Last 3 Weights  Weight (lbs) 135 lb 130 lb 15.3 oz 131 lb  Weight (kg) 61.236 kg 59.4 kg 59.421 kg     Body mass index is 20.53 kg/m.  General:  Well nourished, well developed, in no acute distress HEENT: normal Neck: no JVD Vascular: No carotid bruits; Distal pulses 2+ bilaterally Cardiac:  normal S1, S2; RRR; no murmur  Lungs:  clear to auscultation bilaterally, no wheezing, rhonchi or rales  Abd: soft, nontender, no hepatomegaly  Ext: no edema Musculoskeletal:  No  deformities, BUE and BLE strength normal and equal Skin: warm and dry  Neuro:  CNs 2-12 intact, no focal abnormalities noted Psych:  Normal affect   EKG:  The EKG was personally reviewed and demonstrates: Normal sinus rhythm with deep T wave inversions V2-V4 Telemetry:  Telemetry was personally reviewed and demonstrates: Pending  Relevant CV  Studies: None  Laboratory Data: High Sensitivity Troponin:  No results for input(s): TROPONINIHS in the last 720 hours.  Recent Labs  Lab 04/20/24 0128  TRNPT 119*      Chemistry Recent Labs  Lab 04/20/24 0128  NA 139  K 4.1  CL 100  CO2 26  GLUCOSE 141*  BUN 23  CREATININE 0.88  CALCIUM  9.4  GFRNONAA >60  ANIONGAP 12    No results for input(s): PROT, ALBUMIN, AST, ALT, ALKPHOS, BILITOT in the last 168 hours. Lipids No results for input(s): CHOL, TRIG, HDL, LABVLDL, LDLCALC, CHOLHDL in the last 168 hours.  Hematology Recent Labs  Lab 04/20/24 0128  WBC 9.2  RBC 3.75*  HGB 12.1*  HCT 36.9*  MCV 98.4  MCH 32.3  MCHC 32.8  RDW 13.7  PLT 350   Thyroid No results for input(s): TSH, FREET4 in the last 168 hours.  BNPNo results for input(s): BNP, PROBNP in the last 168 hours.  DDimer No results for input(s): DDIMER in the last 168 hours.  Radiology/Studies:  DG Chest 2 View Result Date: 04/20/2024 EXAM: 2 VIEW(S) XRAY OF THE CHEST 04/20/2024 01:55:00 AM COMPARISON: None available. CLINICAL HISTORY: Chest pain. FINDINGS: LUNGS AND PLEURA: No focal pulmonary opacity. No pleural effusion. No pneumothorax. HEART AND MEDIASTINUM: No acute abnormality of the cardiac and mediastinal silhouettes. BONES AND SOFT TISSUES: Degenerative changes. IMPRESSION: 1. No acute process. Electronically signed by: Dorethia Molt MD 04/20/2024 02:21 AM EST RP Workstation: [id number]   Assessment and Plan: NSTEMI-presented with 3 days of waxing and waning chest pain with elevated troponin and deep T wave inversions V2-V4 insistent with NSTEMI.  Given the recent passing of his wife he is at risk for stress-induced cardiomyopathy and this remains on the differential.  For now we will treat as an acute coronary event and he will need coronary angiography to rule out an acute occlusion - Echo - Heparin  drip - 325 mg aspirin  once followed by 81 mg daily - He is  unsure if he would like to pursue coronary angiography at this time and I relayed to him that he can take some time to decide given that will not be completed eminently.  I also mention to him that he may wish to discuss this with his daughter.  I made him aware that coronary angiography is not definitive procedure for his current condition and he is aware of this. - Trend troponins - Lipid panel, LP(a) Bladder cancer - follow-up outpatient   Risk Assessment/Risk Scores:    TIMI Risk Score for Unstable Angina or Non-ST Elevation MI:   The patient's TIMI risk score is 6 , which indicates a 41 % risk of all cause mortality, new or recurrent myocardial infarction or need for urgent revascularization in the next 14 days.         For questions or updates, please contact [facility] Please consult [email or web address] for contact info under      Signed, Jalaine DELENA Newcomer, MD  04/20/2024 3:37 AM     [1] No Known Allergies  "

## 2024-04-20 NOTE — ED Triage Notes (Signed)
 Pt brought in for CP from Home. Per EMS pt has has CP for the past few days. Per patient it increases when he goes to bed. Per pt he called 911 due to the pain not going away. Pts wife did pass away at this hospital 04/08/2024, pt has been under stress at home.

## 2024-04-20 NOTE — ED Notes (Signed)
 Pt standing at bedside to use urinal. Denies any needs at this time.

## 2024-04-20 NOTE — Plan of Care (Signed)

## 2024-04-20 NOTE — Progress Notes (Signed)
 Echo unable to be attempted. Pt Jeremy Holmes 2:58 PM 04/20/24

## 2024-04-20 NOTE — Progress Notes (Signed)
 PHARMACY - ANTICOAGULATION CONSULT NOTE  Pharmacy Consult for Heparin  Indication: chest pain/ACS  Allergies[1]  Patient Measurements: Height: 5' 8 (172.7 cm) Weight: 61.2 kg (135 lb) IBW/kg (Calculated) : 68.4 HEPARIN  DW (KG): 61.2  Vital Signs: Temp: 97.8 F (36.6 C) (01/24 0116) Temp Source: Oral (01/24 0116) BP: 177/93 (01/24 0116) Pulse Rate: 85 (01/24 0116)  Labs: Recent Labs    04/20/24 0128  HGB 12.1*  HCT 36.9*  PLT 350  CREATININE 0.88    Estimated Creatinine Clearance: 50.2 mL/min (by C-G formula based on SCr of 0.88 mg/dL).   Medical History: Past Medical History:  Diagnosis Date   Anemia    Anxiety    no meds   Arthritis    right knee   Cancer (HCC)    skin: head,nose,back, bladder cancer   HLD (hyperlipidemia)    Pneumonia    Pre-diabetes    diet controlled - no meds    Medications:  Medications Ordered Prior to Encounter[2]   Assessment: .89 y.o. male with chest pain for heparin   Goal of Therapy:  Heparin  level 0.3-0.7 units/ml Monitor platelets by anticoagulation protocol: Yes   Plan:  Heparin  3000 units IV bolus, then start heparin  750 units/hr Check heparin  level in 8 hours.   Geryl Dohn, Cordella Misty 04/20/2024,2:28 AM      [1] No Known Allergies [2]  No current facility-administered medications on file prior to encounter.   Current Outpatient Medications on File Prior to Encounter  Medication Sig Dispense Refill   acetaminophen  (TYLENOL ) 500 MG tablet Take 1,000 mg by mouth 2 (two) times daily.     aspirin  EC 81 MG tablet Take 81 mg by mouth daily. Swallow whole.     atorvastatin  (LIPITOR) 20 MG tablet Take 20 mg by mouth daily.     CINNAMON PO Take 2,000 mg by mouth daily. 1000 mg each     ferrous sulfate 325 (65 FE) MG EC tablet Take 325 mg by mouth daily with breakfast.     Lidocaine  HCl (ASPERCREME LIDOCAINE ) 4 % CREA Apply 1 application  topically 2 (two) times daily. Back pain     Magnesium  250 MG TABS Take 250 mg  by mouth daily.     Multiple Vitamins-Minerals (CENTRUM SILVER PO) Take 1 tablet by mouth daily.     Omega-3 Fatty Acids (FISH OIL PO) Take 2,000 mg by mouth daily.     ondansetron  (ZOFRAN ) 4 MG tablet Take 1 tablet (4 mg total) by mouth daily as needed for nausea or vomiting. (Patient not taking: Reported on 01/22/2024) 30 tablet 1   oxybutynin  (DITROPAN ) 5 MG tablet Take 1 tablet (5 mg total) by mouth every 8 (eight) hours as needed for bladder spasms. (Patient not taking: Reported on 01/22/2024) 30 tablet 1   phenazopyridine  (PYRIDIUM ) 200 MG tablet Take 1 tablet (200 mg total) by mouth 3 (three) times daily as needed (for pain with urination). (Patient not taking: Reported on 01/22/2024) 30 tablet 0   ZINC-VITAMIN C PO Take 1 tablet by mouth daily. Vitamin D

## 2024-04-20 NOTE — Progress Notes (Signed)
 PHARMACY - ANTICOAGULATION  Pharmacy Consult for Heparin  Indication: chest pain/ACS Brief A/P: Heparin  level subtherapeutic Increase Heparin  rate  Allergies[1]  Patient Measurements: Height: 5' 8 (172.7 cm) Weight: 57.8 kg (127 lb 6.8 oz) IBW/kg (Calculated) : 68.4 HEPARIN  DW (KG): 57.8  Vital Signs: Temp: 98.3 F (36.8 C) (01/24 1922) Temp Source: Oral (01/24 1922) BP: 167/64 (01/24 1922) Pulse Rate: 69 (01/24 1922)  Labs: Recent Labs    04/20/24 0128 04/20/24 1026 04/20/24 2137  HGB 12.1*  --   --   HCT 36.9*  --   --   PLT 350  --   --   HEPARINUNFRC  --  <0.10* 0.10*  CREATININE 0.88  --   --     Estimated Creatinine Clearance: 47.4 mL/min (by C-G formula based on SCr of 0.88 mg/dL).  Assessment: 89 y.o. male with chest pain for heparin   Goal of Therapy:  Heparin  level 0.3-0.7 units/ml Monitor platelets by anticoagulation protocol: Yes   Plan:  Heparin  2000 units IV bolus, then increase heparin  1150 units/hr Follow-up am labs.    Dail Cordella Misty 04/20/2024,10:41 PM       [1] No Known Allergies

## 2024-04-20 NOTE — Progress Notes (Signed)
 "  Rounding Note   Patient Name: Jeremy Holmes Date of Encounter: 04/20/2024  [facility] Cardiologist: None New  Subjective No more chest pain since admission. Unsure about having a cath.  Scheduled Meds:  aspirin   324 mg Oral Once   aspirin  EC  81 mg Oral Daily   atorvastatin   40 mg Oral Daily   metoprolol  tartrate  12.5 mg Oral BID   Continuous Infusions:  heparin  750 Units/hr (04/20/24 0251)   PRN Meds: acetaminophen , nitroGLYCERIN , ondansetron  (ZOFRAN ) IV, oxybutynin , phenazopyridine    Vital Signs  Vitals:   04/20/24 0400 04/20/24 0445 04/20/24 0722 04/20/24 0805  BP: (!) 145/69 (!) 162/75  138/70  Pulse: 62 66  63  Resp: 19 19  14   Temp:   98 F (36.7 C)   TempSrc:   Oral   SpO2: 100% 100%  99%  Weight:      Height:       No intake or output data in the 24 hours ending 04/20/24 0948    04/20/2024    1:17 AM 01/31/2024    8:24 AM 01/24/2024    1:22 PM  Last 3 Weights  Weight (lbs) 135 lb 130 lb 15.3 oz 131 lb  Weight (kg) 61.236 kg 59.4 kg 59.421 kg      Telemetry Sinus rhythm with frequent PACs and some PVCs- Personally Reviewed  ECG  Sinus rhythm, heart rate 63, deep T wave inversions V3-4, upright in lateral leads, right bundle noted on admission,- Personally Reviewed  Physical Exam GEN: No acute distress.   Neck: No JVD Cardiac: RRR, no murmurs, rubs, or gallops.  Respiratory: Clear to auscultation bilaterally. GI: Soft, nontender, non-distended  MS: No edema; No deformity. Neuro:  Nonfocal  Psych: Normal affect   Labs High Sensitivity Troponin:  No results for input(s): TROPONINIHS in the last 720 hours.  Recent Labs  Lab 04/20/24 0128 04/20/24 0318  TRNPT 119* 191*       Chemistry Recent Labs  Lab 04/20/24 0128  NA 139  K 4.1  CL 100  CO2 26  GLUCOSE 141*  BUN 23  CREATININE 0.88  CALCIUM  9.4  GFRNONAA >60  ANIONGAP 12    Lipids  Recent Labs  Lab 04/20/24 0506  CHOL 117  TRIG 66  HDL 44  LDLCALC 60   CHOLHDL 2.7    Hematology Recent Labs  Lab 04/20/24 0128  WBC 9.2  RBC 3.75*  HGB 12.1*  HCT 36.9*  MCV 98.4  MCH 32.3  MCHC 32.8  RDW 13.7  PLT 350   Thyroid No results for input(s): TSH, FREET4 in the last 168 hours.  BNPNo results for input(s): BNP, PROBNP in the last 168 hours.  DDimer No results for input(s): DDIMER in the last 168 hours.   Radiology  DG Chest 2 View Result Date: 04/20/2024 EXAM: 2 VIEW(S) XRAY OF THE CHEST 04/20/2024 01:55:00 AM COMPARISON: None available. CLINICAL HISTORY: Chest pain. FINDINGS: LUNGS AND PLEURA: No focal pulmonary opacity. No pleural effusion. No pneumothorax. HEART AND MEDIASTINUM: No acute abnormality of the cardiac and mediastinal silhouettes. BONES AND SOFT TISSUES: Degenerative changes. IMPRESSION: 1. No acute process. Electronically signed by: Dorethia Molt MD 04/20/2024 02:21 AM EST RP Workstation: [id number]    Cardiac Studies ECHO: Ordered  Patient Profile   89 y.o. male with a history of bladder cancer s/p BCG therapy and bereavement due to recent loss of his wife, was admitted 1/24 with substernal chest pain for 2 nights.  On admission,  a code discussion was held and he was made a DNR.  Assessment & Plan  1.  NSTEMI: - Cardiac catheterization was discussed with the patient fully. The patient understands that risks include but are not limited to stroke (1 in 1000), death (1 in 1000), kidney failure [usually temporary] (1 in 500), bleeding (1 in 200), allergic reaction [possibly serious] (1 in 200).  The patient understands, but is not sure about having the procedure. - He was advised that we need to know by tomorrow. - I went ahead and wrote precath orders and put him on the cath list. - He understands that the cath would not be until Monday and I do not have an exact time. - Aspirin  81 mg daily added to his medication regimen, continue heparin .  He is also on a beta-blocker and high-dose statin. - Follow-up on echo  results, he is at high risk for Takotsubo cardiomyopathy with the recent death of his wife.  No other acute issues or concerns.  For questions or updates, please contact [facility] Please consult [email or web address] for contact info under       Signed, Shona Shad, PA-C  04/20/2024, 9:48 AM    "

## 2024-04-20 NOTE — Progress Notes (Signed)
 PHARMACY - ANTICOAGULATION CONSULT NOTE  Pharmacy Consult for Heparin  Indication: chest pain/ACS  Allergies[1]  Patient Measurements: Height: 5' 8 (172.7 cm) Weight: 61.2 kg (135 lb) IBW/kg (Calculated) : 68.4 HEPARIN  DW (KG): 61.2  Vital Signs: Temp: 98 F (36.7 C) (01/24 0722) Temp Source: Oral (01/24 0722) BP: 138/71 (01/24 1021) Pulse Rate: 69 (01/24 1021)  Labs: Recent Labs    04/20/24 0128 04/20/24 1026  HGB 12.1*  --   HCT 36.9*  --   PLT 350  --   HEPARINUNFRC  --  <0.10*  CREATININE 0.88  --     Estimated Creatinine Clearance: 50.2 mL/min (by C-G formula based on SCr of 0.88 mg/dL).   Medical History: Past Medical History:  Diagnosis Date   Anemia    Anxiety    no meds   Arthritis    right knee   Cancer (HCC)    skin: head,nose,back, bladder cancer   HLD (hyperlipidemia)    Pneumonia    Pre-diabetes    diet controlled - no meds    Medications:  Medications Ordered Prior to Encounter[2]   Assessment: .89 y.o. male with CP, heparin  initiated for NSTEMI, pending decision about cath.  Initial heparin  level undetectable on 750 units/hr, no infusion issues per RN  Goal of Therapy:  Heparin  level 0.3-0.7 units/ml Monitor platelets by anticoagulation protocol: Yes   Plan:  Heparin  2000 units IV x 1, and gtt rate increase to 950 units/hr F/u 8 hour heparin  level  Dorn Poot, PharmD, [facility] Clinical Pharmacist ED Pharmacist Phone # [phone] 04/20/2024 11:08 AM       [1] No Known Allergies [2]  No current facility-administered medications on file prior to encounter.   Current Outpatient Medications on File Prior to Encounter  Medication Sig Dispense Refill   acetaminophen  (TYLENOL ) 500 MG tablet Take 1,000 mg by mouth 2 (two) times daily as needed for mild pain (pain score 1-3) or headache.     ascorbic acid (VITAMIN C) 1000 MG tablet Take 1,000 mg by mouth daily.     aspirin  EC 81 MG tablet Take 81 mg by mouth daily. Swallow  whole.     atorvastatin  (LIPITOR) 20 MG tablet Take 20 mg by mouth daily.     Cinnamon 500 MG capsule Take 1,000 mg by mouth daily.     ferrous sulfate 325 (65 FE) MG EC tablet Take 325 mg by mouth daily with breakfast.     Lidocaine  HCl (ASPERCREME LIDOCAINE ) 4 % CREA Apply 1 application  topically daily as needed (muscle and joint pain). Back pain     Magnesium  250 MG TABS Take 250 mg by mouth daily.     Multiple Vitamins-Minerals (CENTRUM SILVER PO) Take 1 tablet by mouth daily.     Omega-3 Fatty Acids (FISH OIL) 1200 MG CAPS Take 1,200 mg by mouth daily.     [DISCONTINUED] CINNAMON PO Take 2,000 mg by mouth daily. 1000 mg each (Patient not taking: Reported on 04/20/2024)     [DISCONTINUED] ondansetron  (ZOFRAN ) 4 MG tablet Take 1 tablet (4 mg total) by mouth daily as needed for nausea or vomiting. (Patient not taking: Reported on 01/22/2024) 30 tablet 1   [DISCONTINUED] oxybutynin  (DITROPAN ) 5 MG tablet Take 1 tablet (5 mg total) by mouth every 8 (eight) hours as needed for bladder spasms. (Patient not taking: Reported on 01/22/2024) 30 tablet 1   [DISCONTINUED] phenazopyridine  (PYRIDIUM ) 200 MG tablet Take 1 tablet (200 mg total) by mouth 3 (three) times daily as needed (  for pain with urination). (Patient not taking: Reported on 01/22/2024) 30 tablet 0   [DISCONTINUED] ZINC-VITAMIN C PO Take 1 tablet by mouth daily. Vitamin D (Patient not taking: Reported on 04/20/2024)

## 2024-04-20 NOTE — ED Provider Notes (Signed)
 " [place] [facility] Provider Note   CSN: [id number] Arrival date & time: 04/20/24  9887     Patient presents with: Chest Pain   Jeremy Holmes is a 89 y.o. male.  {Add pertinent medical, surgical, social history, OB history to YEP:67052} The history is provided by the patient and medical records.  Chest Pain Jeremy Holmes is a 89 y.o. male who presents to the Emergency Department complaining of chest pain.  He presents to the emergency department for evaluation of central chest pain that started 3 nights ago.  Pain is a waxing and waning and recurred tonight, which prompted ED visit.  He has associated shortness of breath.  He denies any fevers, abdominal pain, vomiting, diarrhea, leg swelling or pain.  His only significant medical history is bladder cancer.  No similar episodes in the past.  His wife did recently passed away on 2024-05-05.        Prior to Admission medications  Medication Sig Start Date End Date Taking? Authorizing Provider  acetaminophen  (TYLENOL ) 500 MG tablet Take 1,000 mg by mouth 2 (two) times daily.    [provider]  aspirin  EC 81 MG tablet Take 81 mg by mouth daily. Swallow whole.    [provider]  atorvastatin  (LIPITOR) 20 MG tablet Take 20 mg by mouth daily.    [provider]  CINNAMON PO Take 2,000 mg by mouth daily. 1000 mg each    [provider]  ferrous sulfate 325 (65 FE) MG EC tablet Take 325 mg by mouth daily with breakfast.    [provider]  Lidocaine  HCl (ASPERCREME LIDOCAINE ) 4 % CREA Apply 1 application  topically 2 (two) times daily. Back pain    [provider]  Magnesium  250 MG TABS Take 250 mg by mouth daily.    [provider]  Multiple Vitamins-Minerals (CENTRUM SILVER PO) Take 1 tablet by mouth daily.    [provider]  Omega-3 Fatty Acids (FISH OIL PO) Take 2,000 mg by mouth daily.    [provider]  ondansetron   (ZOFRAN ) 4 MG tablet Take 1 tablet (4 mg total) by mouth daily as needed for nausea or vomiting. Patient not taking: Reported on 01/22/2024 09/01/23 08/31/24  Devere Lonni Righter, MD  oxybutynin  (DITROPAN ) 5 MG tablet Take 1 tablet (5 mg total) by mouth every 8 (eight) hours as needed for bladder spasms. Patient not taking: Reported on 01/22/2024 08/30/23   Devere Lonni Righter, MD  phenazopyridine  (PYRIDIUM ) 200 MG tablet Take 1 tablet (200 mg total) by mouth 3 (three) times daily as needed (for pain with urination). Patient not taking: Reported on 01/22/2024 08/30/23 08/29/24  Devere Lonni Righter, MD  ZINC-VITAMIN C PO Take 1 tablet by mouth daily. Vitamin D    [provider]    Allergies: Patient has no known allergies.    Review of Systems  Cardiovascular:  Positive for chest pain.  All other systems reviewed and are negative.   Updated Vital Signs BP (!) 177/93 (BP Location: Right Arm)   Pulse 85   Temp 97.8 F (36.6 C) (Oral)   Resp 16   Ht 5' 8 (1.727 m)   Wt 61.2 kg   SpO2 100%   BMI 20.53 kg/m   Physical Exam Vitals and nursing note reviewed.  Constitutional:      Appearance: He is well-developed.  HENT:     Head: Normocephalic and atraumatic.  Cardiovascular:  Rate and Rhythm: Normal rate and regular rhythm.     Heart sounds: No murmur heard. Pulmonary:     Effort: Pulmonary effort is normal. No respiratory distress.     Breath sounds: Normal breath sounds.  Abdominal:     Palpations: Abdomen is soft.     Tenderness: There is no abdominal tenderness. There is no guarding or rebound.  Musculoskeletal:        General: No swelling or tenderness.  Skin:    General: Skin is warm and dry.  Neurological:     Mental Status: He is alert and oriented to person, place, and time.  Psychiatric:        Behavior: Behavior normal.     (all labs ordered are listed, but only abnormal results are displayed) Labs Reviewed  BASIC METABOLIC PANEL WITH  GFR  CBC  TROPONIN T, HIGH SENSITIVITY    EKG: EKG Interpretation Date/Time:  Saturday April 20 2024 01:16:00 EST Ventricular Rate:  78 PR Interval:  134 QRS Duration:  127 QT Interval:  406 QTC Calculation: 463 R Axis:   90  Text Interpretation: Sinus rhythm Multiple premature complexes, vent & supraven RBBB and LPFB Minimal ST elevation, anterior leads Confirmed by Griselda Norris [phone]) on 04/20/2024 1:31:57 AM  Radiology: No results found.  {Document cardiac monitor, telemetry assessment procedure when appropriate:32947} Procedures   Medications Ordered in the ED - No data to display    {Click here for ABCD2, HEART and other calculators REFRESH Note before signing:1}                              Medical Decision Making Amount and/or Complexity of Data Reviewed Labs: ordered. Radiology: ordered.  Risk OTC drugs. Prescription drug management. Decision regarding hospitalization.   ***  {Document critical care time when appropriate  Document review of labs and clinical decision tools ie CHADS2VASC2, etc  Document your independent review of radiology images and any outside records  Document your discussion with family members, caretakers and with consultants  Document social determinants of health affecting pt's care  Document your decision making why or why not admission, treatments were needed:32947:::1}   Final diagnoses:  None    ED Discharge Orders     None        "

## 2024-04-20 NOTE — Progress Notes (Signed)
 Patient received from ED via wheelchair, Ao x4 . CHG completed, connected to tele and CCMD notified. Denies chest pain. Oriented patient to room and call bell system. Call bell within reach. Plan of care continues.

## 2024-04-21 ENCOUNTER — Observation Stay (HOSPITAL_COMMUNITY)

## 2024-04-21 DIAGNOSIS — F419 Anxiety disorder, unspecified: Secondary | ICD-10-CM | POA: Diagnosis present

## 2024-04-21 DIAGNOSIS — R4701 Aphasia: Secondary | ICD-10-CM | POA: Diagnosis not present

## 2024-04-21 DIAGNOSIS — I5021 Acute systolic (congestive) heart failure: Secondary | ICD-10-CM | POA: Diagnosis present

## 2024-04-21 DIAGNOSIS — Z79899 Other long term (current) drug therapy: Secondary | ICD-10-CM | POA: Diagnosis not present

## 2024-04-21 DIAGNOSIS — Z87891 Personal history of nicotine dependence: Secondary | ICD-10-CM | POA: Diagnosis not present

## 2024-04-21 DIAGNOSIS — I214 Non-ST elevation (NSTEMI) myocardial infarction: Secondary | ICD-10-CM

## 2024-04-21 DIAGNOSIS — R7303 Prediabetes: Secondary | ICD-10-CM | POA: Diagnosis present

## 2024-04-21 DIAGNOSIS — I2510 Atherosclerotic heart disease of native coronary artery without angina pectoris: Secondary | ICD-10-CM | POA: Diagnosis present

## 2024-04-21 DIAGNOSIS — F32A Depression, unspecified: Secondary | ICD-10-CM | POA: Diagnosis present

## 2024-04-21 DIAGNOSIS — E785 Hyperlipidemia, unspecified: Secondary | ICD-10-CM | POA: Diagnosis present

## 2024-04-21 DIAGNOSIS — R4182 Altered mental status, unspecified: Secondary | ICD-10-CM | POA: Diagnosis not present

## 2024-04-21 DIAGNOSIS — R072 Precordial pain: Secondary | ICD-10-CM | POA: Diagnosis present

## 2024-04-21 DIAGNOSIS — R000 Tachycardia, unspecified: Secondary | ICD-10-CM | POA: Diagnosis not present

## 2024-04-21 DIAGNOSIS — Z7982 Long term (current) use of aspirin: Secondary | ICD-10-CM | POA: Diagnosis not present

## 2024-04-21 DIAGNOSIS — Z634 Disappearance and death of family member: Secondary | ICD-10-CM | POA: Diagnosis not present

## 2024-04-21 DIAGNOSIS — Z8701 Personal history of pneumonia (recurrent): Secondary | ICD-10-CM | POA: Diagnosis not present

## 2024-04-21 DIAGNOSIS — Z66 Do not resuscitate: Secondary | ICD-10-CM | POA: Diagnosis present

## 2024-04-21 DIAGNOSIS — Z8551 Personal history of malignant neoplasm of bladder: Secondary | ICD-10-CM | POA: Diagnosis not present

## 2024-04-21 DIAGNOSIS — M1711 Unilateral primary osteoarthritis, right knee: Secondary | ICD-10-CM | POA: Diagnosis present

## 2024-04-21 LAB — ECHOCARDIOGRAM COMPLETE
AR max vel: 3.44 cm2
AV Area VTI: 3.08 cm2
AV Area mean vel: 3.33 cm2
AV Mean grad: 2 mmHg
AV Peak grad: 4.2 mmHg
Ao pk vel: 1.02 m/s
Area-P 1/2: 3.72 cm2
Calc EF: 52.5 %
Height: 68 in
P 1/2 time: 135 ms
S' Lateral: 3.3 cm
Single Plane A2C EF: 55.2 %
Single Plane A4C EF: 54.3 %
Weight: 2038.81 [oz_av]

## 2024-04-21 LAB — CBC
HCT: 33.8 % — ABNORMAL LOW (ref 39.0–52.0)
Hemoglobin: 11.4 g/dL — ABNORMAL LOW (ref 13.0–17.0)
MCH: 32 pg (ref 26.0–34.0)
MCHC: 33.7 g/dL (ref 30.0–36.0)
MCV: 94.9 fL (ref 80.0–100.0)
Platelets: 337 10*3/uL (ref 150–400)
RBC: 3.56 MIL/uL — ABNORMAL LOW (ref 4.22–5.81)
RDW: 13.7 % (ref 11.5–15.5)
WBC: 10.7 10*3/uL — ABNORMAL HIGH (ref 4.0–10.5)
nRBC: 0 % (ref 0.0–0.2)

## 2024-04-21 LAB — HEPARIN LEVEL (UNFRACTIONATED)
Heparin Unfractionated: 0.35 [IU]/mL (ref 0.30–0.70)
Heparin Unfractionated: 0.32 [IU]/mL (ref 0.30–0.70)

## 2024-04-21 MED ORDER — ASPIRIN 81 MG PO CHEW
81.0000 mg | CHEWABLE_TABLET | ORAL | Status: AC
  Administered 2024-04-22: 81 mg via ORAL
  Filled 2024-04-21: qty 1

## 2024-04-21 MED ORDER — FREE WATER
500.0000 mL | Freq: Once | Status: AC
  Administered 2024-04-22: 500 mL via ORAL

## 2024-04-21 MED ORDER — ZOLPIDEM TARTRATE 5 MG PO TABS
2.5000 mg | ORAL_TABLET | Freq: Once | ORAL | Status: AC
  Administered 2024-04-22: 2.5 mg via ORAL
  Filled 2024-04-21: qty 1

## 2024-04-21 MED ORDER — NITROGLYCERIN IN D5W 200-5 MCG/ML-% IV SOLN
0.0000 ug/min | INTRAVENOUS | Status: DC
  Administered 2024-04-21: 40 ug/min via INTRAVENOUS
  Administered 2024-04-22: 5 ug/min via INTRAVENOUS
  Filled 2024-04-21 (×2): qty 250

## 2024-04-21 NOTE — Progress Notes (Addendum)
 Patient c/o chest pain 7/10 in lower chest. EKG completed. Dr Shermon in room.  Dr Shermon notified Dr Almetta. Orders in for Sublingual Nitroglycerin  x1 and titrated Nitroglycerin  gtt started a 40 mcg/min.   0954 Pain 4/10 - Nitroglycerin  gtt continued at 40 mcg/min.   1000 Pain 1/10 - Nitroglycerin  gtt continued at 40 mcg/min   1200 Pain 1/10 - Nitroglycerin  gtt titrated down to 30 mcg/min BP 116/65 HR 79

## 2024-04-21 NOTE — Progress Notes (Signed)
" ° °  Called to see pt re: CP  He was at rest when he started having CP, same as on admit, at a 7/10.  ECG not a STEMI but looks slightly different, MD to review.  Will give SL NTG x 1 and add IV nitroglycerin .  Cont to monitor closely.  Shona Shad, PA-C 04/21/2024 9:32 AM    "

## 2024-04-21 NOTE — Progress Notes (Signed)
 PHARMACY - ANTICOAGULATION  Pharmacy Consult for Heparin  Indication: chest pain/ACS   Allergies[1]  Patient Measurements: Height: 5' 8 (172.7 cm) Weight: 57.8 kg (127 lb 6.8 oz) IBW/kg (Calculated) : 68.4 HEPARIN  DW (KG): 57.8  Vital Signs: Temp: 98.3 F (36.8 C) (01/25 1116) Temp Source: Oral (01/25 1116) BP: 140/73 (01/25 1613) Pulse Rate: 68 (01/25 1613)  Labs: Recent Labs    04/20/24 0128 04/20/24 1026 04/20/24 2137 04/21/24 0248 04/21/24 1711  HGB 12.1*  --   --  11.4*  --   HCT 36.9*  --   --  33.8*  --   PLT 350  --   --  337  --   HEPARINUNFRC  --    < > 0.10* 0.35 0.32  CREATININE 0.88  --   --   --   --    < > = values in this interval not displayed.    Estimated Creatinine Clearance: 47.4 mL/min (by C-G formula based on SCr of 0.88 mg/dL).  Assessment: 89 y.o. male with chest pain for heparin .  Confirmatory heparin  level therapeutic.  Goal of Therapy:  Heparin  level 0.3-0.7 units/ml Monitor platelets by anticoagulation protocol: Yes   Plan:  Continue heparin  1150 units/hr Daily heparin  level and CBC  Jeremy Holmes, PharmD, BCPS, [facility] Clinical Pharmacist [phone] Please check AMION for all [facility] Pharmacy numbers 04/21/2024      [1] No Known Allergies

## 2024-04-21 NOTE — Progress Notes (Signed)
 " Cardiology Consultation:   Patient ID: Jeremy Holmes MRN: [id number]; DOB: 08-27-1933  Admit date: 04/20/2024 Date of Consult: 04/21/2024  Primary Care Provider: Frederik Charleston, MD [facility] Cardiologist: None  [facility] Electrophysiologist:  None   Patient Profile:  89 y.o. male chest pain, HLD, prior bladder CA s/p BCG tx who presents with intermittent chest pain and mild hsT elevation with anterior TWI consistent with NSTEMI vs stress-induced CM.   Interval Events:   No recurrent CP since admission. Asx without any complaints. Has been sedentary while on heparin  gtt. Encouraged ambulation today.   Past Medical History:  Diagnosis Date   Anemia    Anxiety    no meds   Arthritis    right knee   Cancer (HCC)    skin: head,nose,back, bladder cancer   HLD (hyperlipidemia)    Pneumonia    Pre-diabetes    diet controlled - no meds   Past Surgical History:  Procedure Laterality Date   COLONOSCOPY     CYSTOSCOPY WITH BIOPSY N/A 01/31/2024   Procedure: CYSTOSCOPY, WITH BIOPSY;  Surgeon: Devere Lonni Righter, MD;  Location: [facility];  Service: Urology;  Laterality: N/A;   CYSTOSCOPY WITH FULGERATION N/A 01/31/2024   Procedure: CYSTOSCOPY, WITH BLADDER FULGURATION;  Surgeon: Devere Lonni Righter, MD;  Location: [facility];  Service: Urology;  Laterality: N/A;   CYSTOSCOPY WITH URETEROSCOPY AND STENT PLACEMENT Right 01/31/2024   Procedure: CYSTOURETEROSCOPY, WITH STENT INSERTION;  Surgeon: Devere Lonni Righter, MD;  Location: [facility];  Service: Urology;  Laterality: Right;   CYSTOSCOPY WITH URETHRAL DILATATION N/A 01/31/2024   Procedure: CYSTOSCOPY, WITH URETHRAL DILATION;  Surgeon: Devere Lonni Righter, MD;  Location: [facility];  Service: Urology;  Laterality: N/A;   ELBOW SURGERY Right    EYE SURGERY Bilateral    cataracts surgery   INGUINAL HERNIA REPAIR Right 04/27/2021   Procedure: OPEN REPAIR RIGHT INGUINAL HERNIA WITH MESH;  Surgeon: Eletha Boas, MD;   Location: [facility];  Service: General;  Laterality: Right;   KNEE ARTHROSCOPY Right 08/20/2020   Procedure: ARTHROSCOPY KNEE AND IRRIGATION AND DEBRIDEMENT;  Surgeon: Jerri Kay HERO, MD;  Location: [facility];  Service: Orthopedics;  Laterality: Right;   SHOULDER SURGERY Bilateral    x 2   WISDOM TOOTH EXTRACTION      Inpatient Medications: Scheduled Meds:  aspirin   324 mg Oral Once   aspirin  EC  81 mg Oral Daily   atorvastatin   40 mg Oral Daily   metoprolol  tartrate  12.5 mg Oral BID   Continuous Infusions:  heparin  1,150 Units/hr (04/20/24 2249)   PRN Meds: acetaminophen , nitroGLYCERIN , ondansetron  (ZOFRAN ) IV, oxybutynin , phenazopyridine   Allergies:   Allergies[1]  Social History:   Social History   Socioeconomic History   Marital status: Married    Spouse name: Not on file   Number of children: Not on file   Years of education: Not on file   Highest education level: Not on file  Occupational History   Not on file  Tobacco Use   Smoking status: Former    Types: Cigarettes   Smokeless tobacco: Never  Vaping Use   Vaping status: Never Used  Substance and Sexual Activity   Alcohol use: Not Currently    Alcohol/week: 7.0 standard drinks of alcohol    Types: 7 Glasses of wine per week    Comment: occ at night with supper   Drug use: No   Sexual activity: Not on file  Other Topics Concern  Not on file  Social History Narrative   Not on file   Social Drivers of Health   Tobacco Use: Medium Risk (04/18/2024)   Received from [facility]   Patient History    Smoking Tobacco Use: Former    Smokeless Tobacco Use: Never    Passive Exposure: Never  Physicist, Medical Strain: Low Risk (04/18/2024)   Received from [facility]   Overall Financial Resource Strain (CARDIA)    How hard is it for you to pay for the very basics like food, housing, medical care, and heating?: Not hard at all  Food Insecurity: No Food Insecurity (04/18/2024)   Received from  [facility]   Epic    Within the past 12 months, you worried that your food would run out before you got the money to buy more.: Never true    Within the past 12 months, the food you bought just didn't last and you didn't have money to get more.: Never true  Transportation Needs: No Transportation Needs (04/18/2024)   Received from [facility]    In the past 12 months, has lack of transportation kept you from medical appointments or from getting medications?: No    In the past 12 months, has lack of transportation kept you from meetings, work, or from getting things needed for daily living?: No  Physical Activity: Not on file  Stress: Not on file  Social Connections: Unknown (08/30/2023)   Social Connection and Isolation Panel    Frequency of Communication with Friends and Family: More than three times a week    Frequency of Social Gatherings with Friends and Family: More than three times a week    Attends Religious Services: More than 4 times per year    Active Member of [employer] or Organizations: Patient unable to answer    Attends Banker Meetings: Patient unable to answer    Marital Status: Married  Catering Manager Violence: Not At Risk (08/30/2023)   Humiliation, Afraid, Rape, and Kick questionnaire    Fear of Current or Ex-Partner: No    Emotionally Abused: No    Physically Abused: No    Sexually Abused: No  Depression (PHQ2-9): Not on file  Alcohol Screen: Not on file  Housing: Low Risk (04/18/2024)   Received from [facility]    In the last 12 months, was there a time when you were not able to pay the mortgage or rent on time?: No    In the past 12 months, how many times have you moved where you were living?: 0    At any time in the past 12 months, were you homeless or living in a shelter (including now)?: No  Utilities: Not At Risk (04/18/2024)   Received from [facility]    In the past 12 months has the electric, gas, oil, or water   company threatened to shut off services in your home?: No  Health Literacy: Not on file    Family History:   No family history on file.   ROS:  Review of Systems: [y] = yes, [ ]  = no      General: Weight gain [ ] ; Weight loss [ ] ; Anorexia [ ] ; Fatigue [ ] ; Fever [ ] ; Chills [ ] ; Weakness [ ]    Cardiac: Chest pain/pressure [ ] ; Resting SOB [ ] ; Exertional SOB [ ] ; Orthopnea [ ] ; Pedal Edema [ ] ; Palpitations [ ] ; Syncope [ ] ; Presyncope [ ] ;  Paroxysmal nocturnal dyspnea [ ]    Pulmonary: Cough [ ] ; Wheezing [ ] ; Hemoptysis [ ] ; Sputum [ ] ; Snoring [ ]    GI: Vomiting [ ] ; Dysphagia [ ] ; Melena [ ] ; Hematochezia [ ] ; Heartburn [ ] ; Abdominal pain [ ] ; Constipation [ ] ; Diarrhea [ ] ; BRBPR [ ]    GU: Hematuria [ ] ; Dysuria [ ] ; Nocturia [ ]  Vascular: Pain in legs with walking [ ] ; Pain in feet with lying flat [ ] ; Non-healing sores [ ] ; Stroke [ ] ; TIA [ ] ; Slurred speech [ ] ;   Neuro: Headaches [ ] ; Vertigo [ ] ; Seizures [ ] ; Paresthesias [ ] ;Blurred vision [ ] ; Diplopia [ ] ; Vision changes [ ]    Ortho/Skin: Arthritis [ ] ; Joint pain [ ] ; Muscle pain [ ] ; Joint swelling [ ] ; Back Pain [ ] ; Rash [ ]    Psych: Depression [ ] ; Anxiety [ ]    Heme: Bleeding problems [ ] ; Clotting disorders [ ] ; Anemia [ ]    Endocrine: Diabetes [ ] ; Thyroid dysfunction [ ]    Physical Exam/Data:   Vitals:   04/20/24 1922 04/20/24 2313 04/21/24 0336 04/21/24 0713  BP: (!) 167/64 (!) 141/72 (!) 143/67 (!) 169/68  Pulse: 69 65 67 87  Resp: 20 19 20 20   Temp: 98.3 F (36.8 C) 98.2 F (36.8 C) 98.6 F (37 C) 98.6 F (37 C)  TempSrc: Oral Oral Oral Oral  SpO2: 96% 96% 97% 97%  Weight:      Height:       Intake/Output Summary (Last 24 hours) at 04/21/2024 0814 Last data filed at 04/21/2024 0716 Gross per 24 hour  Intake 425.62 ml  Output 1625 ml  Net -1199.38 ml      04/20/2024    3:16 PM 04/20/2024    1:17 AM 01/31/2024    8:24 AM  Last 3 Weights  Weight (lbs) 127 lb 6.8 oz 135 lb 130 lb 15.3 oz  Weight  (kg) 57.8 kg 61.236 kg 59.4 kg     Body mass index is 19.37 kg/m.  General: Well nourished, well developed, in no acute distress HEENT: Normal Neck: No JVD Cardiac: normal S1, S2; RRR; no murmur  Lungs: Clear to auscultation bilaterally, no wheezing, rhonchi or rales  Ext: No edema Musculoskeletal: No deformities, BUE and BLE strength normal and equal Skin: warm and dry  Neuro: CNs 2-12 intact, no focal abnormalities noted Psych: Normal affect   ECG review 04/20/24: (05:10:03) NSR 63, PR 156, QRS 128, QT/c 499/511, TWI V2-V4, ST elevation III and aVF not meeting STEMI criteria, ST depression in aVL/V2, RBBB 04/20/24: (02:26:59) NSR 63, PR 140, QRS 128, QT/c 531/544, TWI V2-V4, ST elevation IIII and aVF not meeting STEMI criteria, ST depression aVL and V2, RBBB 04/20/24: (01:16:00) NSR 78, PR 134, QRS 127, QT/c 406/463, ST elevation IIII and aVF not meeting STEMI criteria, ST depression aVL and V2, RBBB  Telemetry: personally reviewed and demonstrates NSR 60-90s   Relevant CV Studies:  TTE pending   Laboratory Data:  Chemistry Recent Labs  Lab 04/20/24 0128  NA 139  K 4.1  CL 100  CO2 26  GLUCOSE 141*  BUN 23  CREATININE 0.88  CALCIUM  9.4  GFRNONAA >60  ANIONGAP 12   Hematology Recent Labs  Lab 04/20/24 0128 04/21/24 0248  WBC 9.2 10.7*  RBC 3.75* 3.56*  HGB 12.1* 11.4*  HCT 36.9* 33.8*  MCV 98.4 94.9  MCH 32.3 32.0  MCHC 32.8 33.7  RDW 13.7 13.7  PLT  350 337   Radiology/Studies:  DG Chest 2 View Result Date: 04/20/2024 EXAM: 2 VIEW(S) XRAY OF THE CHEST 04/20/2024 01:55:00 AM COMPARISON: None available. CLINICAL HISTORY: Chest pain. FINDINGS: LUNGS AND PLEURA: No focal pulmonary opacity. No pleural effusion. No pneumothorax. HEART AND MEDIASTINUM: No acute abnormality of the cardiac and mediastinal silhouettes. BONES AND SOFT TISSUES: Degenerative changes. IMPRESSION: 1. No acute process. Electronically signed by: Dorethia Molt MD 04/20/2024 02:21 AM EST RP  Workstation: [id number]   Assessment and Plan:  89 y.o. male chest pain, HLD, prior bladder CA s/p BCG tx who presents with intermittent chest pain and mild hsT elevation with anterior TWI consistent with NSTEMI vs stress-induced CM.    Chest pain  hsT elevation  The combination of sx, mild hsT elevation and deep anterior TWI along with the death of his wife 2 weeks ago is likely consistent with stress-induced CM. With significant thrombotic event I would have expected more sx and a more pronounced hsT elevation to generate the deep TWI and ST changes observed on admission. He is asx right now on heparin  gtt. He was considering coronary angiography but wasn't sure if he wanted to proceed or not. I suggested we first get an LVEF assessment and assessment of WMAs before committing invasive evaluation. He is DNAR and would not want aggressive measures. If TTE is consistent with stress induced CM or without any focal WMAs will likely defer coronary assessment with his age and GOC.  - continue ASA 81 mg PO daily  - continue heparin  gtt - continue atorva 40 mg PO daily  - continue lopressor  12.5 mg PO bid   For questions or updates, please contact [facility] Please consult [email or web address] for contact info under   Signed, Donnice DELENA Primus, MD  04/21/2024 8:14 AM     [1] No Known Allergies  "

## 2024-04-21 NOTE — Plan of Care (Signed)

## 2024-04-21 NOTE — H&P (View-Only) (Signed)
 " Cardiology Consultation:   Patient ID: Jeremy Holmes MRN: [id number]; DOB: 08-27-1933  Admit date: 04/20/2024 Date of Consult: 04/21/2024  Primary Care Provider: Frederik Charleston, MD [facility] Cardiologist: None  [facility] Electrophysiologist:  None   Patient Profile:  89 y.o. male chest pain, HLD, prior bladder CA s/p BCG tx who presents with intermittent chest pain and mild hsT elevation with anterior TWI consistent with NSTEMI vs stress-induced CM.   Interval Events:   No recurrent CP since admission. Asx without any complaints. Has been sedentary while on heparin  gtt. Encouraged ambulation today.   Past Medical History:  Diagnosis Date   Anemia    Anxiety    no meds   Arthritis    right knee   Cancer (HCC)    skin: head,nose,back, bladder cancer   HLD (hyperlipidemia)    Pneumonia    Pre-diabetes    diet controlled - no meds   Past Surgical History:  Procedure Laterality Date   COLONOSCOPY     CYSTOSCOPY WITH BIOPSY N/A 01/31/2024   Procedure: CYSTOSCOPY, WITH BIOPSY;  Surgeon: Devere Lonni Righter, MD;  Location: [facility];  Service: Urology;  Laterality: N/A;   CYSTOSCOPY WITH FULGERATION N/A 01/31/2024   Procedure: CYSTOSCOPY, WITH BLADDER FULGURATION;  Surgeon: Devere Lonni Righter, MD;  Location: [facility];  Service: Urology;  Laterality: N/A;   CYSTOSCOPY WITH URETEROSCOPY AND STENT PLACEMENT Right 01/31/2024   Procedure: CYSTOURETEROSCOPY, WITH STENT INSERTION;  Surgeon: Devere Lonni Righter, MD;  Location: [facility];  Service: Urology;  Laterality: Right;   CYSTOSCOPY WITH URETHRAL DILATATION N/A 01/31/2024   Procedure: CYSTOSCOPY, WITH URETHRAL DILATION;  Surgeon: Devere Lonni Righter, MD;  Location: [facility];  Service: Urology;  Laterality: N/A;   ELBOW SURGERY Right    EYE SURGERY Bilateral    cataracts surgery   INGUINAL HERNIA REPAIR Right 04/27/2021   Procedure: OPEN REPAIR RIGHT INGUINAL HERNIA WITH MESH;  Surgeon: Eletha Boas, MD;   Location: [facility];  Service: General;  Laterality: Right;   KNEE ARTHROSCOPY Right 08/20/2020   Procedure: ARTHROSCOPY KNEE AND IRRIGATION AND DEBRIDEMENT;  Surgeon: Jerri Kay HERO, MD;  Location: [facility];  Service: Orthopedics;  Laterality: Right;   SHOULDER SURGERY Bilateral    x 2   WISDOM TOOTH EXTRACTION      Inpatient Medications: Scheduled Meds:  aspirin   324 mg Oral Once   aspirin  EC  81 mg Oral Daily   atorvastatin   40 mg Oral Daily   metoprolol  tartrate  12.5 mg Oral BID   Continuous Infusions:  heparin  1,150 Units/hr (04/20/24 2249)   PRN Meds: acetaminophen , nitroGLYCERIN , ondansetron  (ZOFRAN ) IV, oxybutynin , phenazopyridine   Allergies:   Allergies[1]  Social History:   Social History   Socioeconomic History   Marital status: Married    Spouse name: Not on file   Number of children: Not on file   Years of education: Not on file   Highest education level: Not on file  Occupational History   Not on file  Tobacco Use   Smoking status: Former    Types: Cigarettes   Smokeless tobacco: Never  Vaping Use   Vaping status: Never Used  Substance and Sexual Activity   Alcohol use: Not Currently    Alcohol/week: 7.0 standard drinks of alcohol    Types: 7 Glasses of wine per week    Comment: occ at night with supper   Drug use: No   Sexual activity: Not on file  Other Topics Concern  Not on file  Social History Narrative   Not on file   Social Drivers of Health   Tobacco Use: Medium Risk (04/18/2024)   Received from [facility]   Patient History    Smoking Tobacco Use: Former    Smokeless Tobacco Use: Never    Passive Exposure: Never  Physicist, Medical Strain: Low Risk (04/18/2024)   Received from [facility]   Overall Financial Resource Strain (CARDIA)    How hard is it for you to pay for the very basics like food, housing, medical care, and heating?: Not hard at all  Food Insecurity: No Food Insecurity (04/18/2024)   Received from  [facility]   Epic    Within the past 12 months, you worried that your food would run out before you got the money to buy more.: Never true    Within the past 12 months, the food you bought just didn't last and you didn't have money to get more.: Never true  Transportation Needs: No Transportation Needs (04/18/2024)   Received from [facility]    In the past 12 months, has lack of transportation kept you from medical appointments or from getting medications?: No    In the past 12 months, has lack of transportation kept you from meetings, work, or from getting things needed for daily living?: No  Physical Activity: Not on file  Stress: Not on file  Social Connections: Unknown (08/30/2023)   Social Connection and Isolation Panel    Frequency of Communication with Friends and Family: More than three times a week    Frequency of Social Gatherings with Friends and Family: More than three times a week    Attends Religious Services: More than 4 times per year    Active Member of [employer] or Organizations: Patient unable to answer    Attends Banker Meetings: Patient unable to answer    Marital Status: Married  Catering Manager Violence: Not At Risk (08/30/2023)   Humiliation, Afraid, Rape, and Kick questionnaire    Fear of Current or Ex-Partner: No    Emotionally Abused: No    Physically Abused: No    Sexually Abused: No  Depression (PHQ2-9): Not on file  Alcohol Screen: Not on file  Housing: Low Risk (04/18/2024)   Received from [facility]    In the last 12 months, was there a time when you were not able to pay the mortgage or rent on time?: No    In the past 12 months, how many times have you moved where you were living?: 0    At any time in the past 12 months, were you homeless or living in a shelter (including now)?: No  Utilities: Not At Risk (04/18/2024)   Received from [facility]    In the past 12 months has the electric, gas, oil, or water   company threatened to shut off services in your home?: No  Health Literacy: Not on file    Family History:   No family history on file.   ROS:  Review of Systems: [y] = yes, [ ]  = no      General: Weight gain [ ] ; Weight loss [ ] ; Anorexia [ ] ; Fatigue [ ] ; Fever [ ] ; Chills [ ] ; Weakness [ ]    Cardiac: Chest pain/pressure [ ] ; Resting SOB [ ] ; Exertional SOB [ ] ; Orthopnea [ ] ; Pedal Edema [ ] ; Palpitations [ ] ; Syncope [ ] ; Presyncope [ ] ;  Paroxysmal nocturnal dyspnea [ ]    Pulmonary: Cough [ ] ; Wheezing [ ] ; Hemoptysis [ ] ; Sputum [ ] ; Snoring [ ]    GI: Vomiting [ ] ; Dysphagia [ ] ; Melena [ ] ; Hematochezia [ ] ; Heartburn [ ] ; Abdominal pain [ ] ; Constipation [ ] ; Diarrhea [ ] ; BRBPR [ ]    GU: Hematuria [ ] ; Dysuria [ ] ; Nocturia [ ]  Vascular: Pain in legs with walking [ ] ; Pain in feet with lying flat [ ] ; Non-healing sores [ ] ; Stroke [ ] ; TIA [ ] ; Slurred speech [ ] ;   Neuro: Headaches [ ] ; Vertigo [ ] ; Seizures [ ] ; Paresthesias [ ] ;Blurred vision [ ] ; Diplopia [ ] ; Vision changes [ ]    Ortho/Skin: Arthritis [ ] ; Joint pain [ ] ; Muscle pain [ ] ; Joint swelling [ ] ; Back Pain [ ] ; Rash [ ]    Psych: Depression [ ] ; Anxiety [ ]    Heme: Bleeding problems [ ] ; Clotting disorders [ ] ; Anemia [ ]    Endocrine: Diabetes [ ] ; Thyroid dysfunction [ ]    Physical Exam/Data:   Vitals:   04/20/24 1922 04/20/24 2313 04/21/24 0336 04/21/24 0713  BP: (!) 167/64 (!) 141/72 (!) 143/67 (!) 169/68  Pulse: 69 65 67 87  Resp: 20 19 20 20   Temp: 98.3 F (36.8 C) 98.2 F (36.8 C) 98.6 F (37 C) 98.6 F (37 C)  TempSrc: Oral Oral Oral Oral  SpO2: 96% 96% 97% 97%  Weight:      Height:       Intake/Output Summary (Last 24 hours) at 04/21/2024 0814 Last data filed at 04/21/2024 0716 Gross per 24 hour  Intake 425.62 ml  Output 1625 ml  Net -1199.38 ml      04/20/2024    3:16 PM 04/20/2024    1:17 AM 01/31/2024    8:24 AM  Last 3 Weights  Weight (lbs) 127 lb 6.8 oz 135 lb 130 lb 15.3 oz  Weight  (kg) 57.8 kg 61.236 kg 59.4 kg     Body mass index is 19.37 kg/m.  General: Well nourished, well developed, in no acute distress HEENT: Normal Neck: No JVD Cardiac: normal S1, S2; RRR; no murmur  Lungs: Clear to auscultation bilaterally, no wheezing, rhonchi or rales  Ext: No edema Musculoskeletal: No deformities, BUE and BLE strength normal and equal Skin: warm and dry  Neuro: CNs 2-12 intact, no focal abnormalities noted Psych: Normal affect   ECG review 04/20/24: (05:10:03) NSR 63, PR 156, QRS 128, QT/c 499/511, TWI V2-V4, ST elevation III and aVF not meeting STEMI criteria, ST depression in aVL/V2, RBBB 04/20/24: (02:26:59) NSR 63, PR 140, QRS 128, QT/c 531/544, TWI V2-V4, ST elevation IIII and aVF not meeting STEMI criteria, ST depression aVL and V2, RBBB 04/20/24: (01:16:00) NSR 78, PR 134, QRS 127, QT/c 406/463, ST elevation IIII and aVF not meeting STEMI criteria, ST depression aVL and V2, RBBB  Telemetry: personally reviewed and demonstrates NSR 60-90s   Relevant CV Studies:  TTE pending   Laboratory Data:  Chemistry Recent Labs  Lab 04/20/24 0128  NA 139  K 4.1  CL 100  CO2 26  GLUCOSE 141*  BUN 23  CREATININE 0.88  CALCIUM  9.4  GFRNONAA >60  ANIONGAP 12   Hematology Recent Labs  Lab 04/20/24 0128 04/21/24 0248  WBC 9.2 10.7*  RBC 3.75* 3.56*  HGB 12.1* 11.4*  HCT 36.9* 33.8*  MCV 98.4 94.9  MCH 32.3 32.0  MCHC 32.8 33.7  RDW 13.7 13.7  PLT  350 337   Radiology/Studies:  DG Chest 2 View Result Date: 04/20/2024 EXAM: 2 VIEW(S) XRAY OF THE CHEST 04/20/2024 01:55:00 AM COMPARISON: None available. CLINICAL HISTORY: Chest pain. FINDINGS: LUNGS AND PLEURA: No focal pulmonary opacity. No pleural effusion. No pneumothorax. HEART AND MEDIASTINUM: No acute abnormality of the cardiac and mediastinal silhouettes. BONES AND SOFT TISSUES: Degenerative changes. IMPRESSION: 1. No acute process. Electronically signed by: Dorethia Molt MD 04/20/2024 02:21 AM EST RP  Workstation: [id number]   Assessment and Plan:  89 y.o. male chest pain, HLD, prior bladder CA s/p BCG tx who presents with intermittent chest pain and mild hsT elevation with anterior TWI consistent with NSTEMI vs stress-induced CM.    Chest pain  hsT elevation  The combination of sx, mild hsT elevation and deep anterior TWI along with the death of his wife 2 weeks ago is likely consistent with stress-induced CM. With significant thrombotic event I would have expected more sx and a more pronounced hsT elevation to generate the deep TWI and ST changes observed on admission. He is asx right now on heparin  gtt. He was considering coronary angiography but wasn't sure if he wanted to proceed or not. I suggested we first get an LVEF assessment and assessment of WMAs before committing invasive evaluation. He is DNAR and would not want aggressive measures. If TTE is consistent with stress induced CM or without any focal WMAs will likely defer coronary assessment with his age and GOC.  - continue ASA 81 mg PO daily  - continue heparin  gtt - continue atorva 40 mg PO daily  - continue lopressor  12.5 mg PO bid   For questions or updates, please contact [facility] Please consult [email or web address] for contact info under   Signed, Donnice DELENA Primus, MD  04/21/2024 8:14 AM     [1] No Known Allergies  "

## 2024-04-21 NOTE — Progress Notes (Signed)
 Chest pain later this morning after I saw him.  ECG with anterior deep T wave inversions that are slightly more prominent than prior and include V1 now.  Gave SLNG x1 and started on nitroglycerin  gtt at 40 mcg/min with complete resolution of sx. TTE pending today. Checked on him and CP free now.

## 2024-04-21 NOTE — Progress Notes (Signed)
 There was a consult for placing a PIV access. Patient is going to [facility] tomorrow. No schedule time yet. Recommended a PIV access tomorrow for procedure, so prevent to reinsertion tomorrow due to something happen through the night. Informed patient's nurse to put in the consult tomorrow morning. She understood it well. HS [employer]

## 2024-04-21 NOTE — Progress Notes (Signed)
 PHARMACY - ANTICOAGULATION  Pharmacy Consult for Heparin  Indication: chest pain/ACS Brief A/P: Heparin  level subtherapeutic Increase Heparin  rate  Allergies[1]  Patient Measurements: Height: 5' 8 (172.7 cm) Weight: 57.8 kg (127 lb 6.8 oz) IBW/kg (Calculated) : 68.4 HEPARIN  DW (KG): 57.8  Vital Signs: Temp: 98.6 F (37 C) (01/25 0713) Temp Source: Oral (01/25 0713) BP: 169/68 (01/25 0713) Pulse Rate: 87 (01/25 0713)  Labs: Recent Labs    04/20/24 0128 04/20/24 1026 04/20/24 2137 04/21/24 0248  HGB 12.1*  --   --  11.4*  HCT 36.9*  --   --  33.8*  PLT 350  --   --  337  HEPARINUNFRC  --  <0.10* 0.10* 0.35  CREATININE 0.88  --   --   --     Estimated Creatinine Clearance: 47.4 mL/min (by C-G formula based on SCr of 0.88 mg/dL).  Assessment: 89 y.o. male with chest pain for heparin    01/25 AM: Heparin  level therapeutic at 0.35 on Heparin  1150 units/hr. CBC stable. No issues or s/sx of bleeding noted upon chart review.   Goal of Therapy:  Heparin  level 0.3-0.7 units/ml Monitor platelets by anticoagulation protocol: Yes   Plan:  Continue heparin  1150 units/hr Confirmatory Anti-Xa level in 8 hours Daily Anti-Xa and CBC  Follow-up plans for Cath  R. Samual Satterfield, PharmD, RPh PGY-1 Acute Care Pharmacy Resident [facility] Please refer to [facility] for [facility] numbers 04/21/2024 8:15 AM    [1] No Known Allergies

## 2024-04-21 NOTE — H&P (Signed)
 "  Cardiology Consultation     Patient ID: Jeremy Holmes MRN: [id number]; DOB: 04/28/1933   Admit date: 04/20/2024 Date of Consult: 04/20/2024   PCP:  Jeremy Charleston, MD              [facility] Providers Cardiologist:  None          Patient Profile: Jeremy Holmes is a 89 y.o. male with a hx of bladder cancer s/p BCG therapy who is being seen 04/20/2024 for the evaluation of chest pain at the request of [facility].   History of Present Illness: Mr. Pundt presented this evening with 3 days of waxing and waning chest pain with associated dyspnea. He has never had these symptoms prior to this. The pain is substernal with no radiation with some associated dyspnea. He denies any pleuritic component to his pain. He characterizes it as pressure. The first two nights he felt it was indigestion as he was eating some snacks after dinner. Tonight he avoided this but still had the same pain. The pain lasted all night and it kept him awake for a lot of the night. He had no pain during the day. He is currently chest pain free. No LE edema but with mild orthopnea.   His wife recently passed away within the past month. He lives alone and feels he can take care of himself. He has a daughter that lives 25 miles from him.    We discussed CODE STATUS and he relayed to me that he did not wish to have advanced measures in the event that he had a life-threatening heart rhythm or respiratory failure.  Therefore I have placed the order for DO NOT RESUSCITATE with no chest compressions or intubation as is consistent with his wishes.       Past Medical History:  Diagnosis Date   Anemia     Anxiety      no meds   Arthritis      right knee   Cancer (HCC)      skin: head,nose,back, bladder cancer   HLD (hyperlipidemia)     Pneumonia     Pre-diabetes      diet controlled - no meds               Past Surgical History:  Procedure Laterality Date   COLONOSCOPY       CYSTOSCOPY WITH BIOPSY  N/A 01/31/2024    Procedure: CYSTOSCOPY, WITH BIOPSY;  Surgeon: Jeremy Lonni Righter, MD;  Location: [facility];  Service: Urology;  Laterality: N/A;   CYSTOSCOPY WITH FULGERATION N/A 01/31/2024    Procedure: CYSTOSCOPY, WITH BLADDER FULGURATION;  Surgeon: Jeremy Lonni Righter, MD;  Location: [facility];  Service: Urology;  Laterality: N/A;   CYSTOSCOPY WITH URETEROSCOPY AND STENT PLACEMENT Right 01/31/2024    Procedure: CYSTOURETEROSCOPY, WITH STENT INSERTION;  Surgeon: Jeremy Lonni Righter, MD;  Location: [facility];  Service: Urology;  Laterality: Right;   CYSTOSCOPY WITH URETHRAL DILATATION N/A 01/31/2024    Procedure: CYSTOSCOPY, WITH URETHRAL DILATION;  Surgeon: Jeremy Lonni Righter, MD;  Location: [facility];  Service: Urology;  Laterality: N/A;   ELBOW SURGERY Right     EYE SURGERY Bilateral      cataracts surgery   INGUINAL HERNIA REPAIR Right 04/27/2021    Procedure: OPEN REPAIR RIGHT INGUINAL HERNIA WITH MESH;  Surgeon: Jeremy Boas, MD;  Location: [facility];  Service: General;  Laterality: Right;   KNEE ARTHROSCOPY Right 08/20/2020  Procedure: ARTHROSCOPY KNEE AND IRRIGATION AND DEBRIDEMENT;  Surgeon: Jeremy Kay HERO, MD;  Location: [facility];  Service: Orthopedics;  Laterality: Right;   SHOULDER SURGERY Bilateral      x 2   WISDOM TOOTH EXTRACTION              Home Medications:         Prior to Admission medications  Medication Sig Start Date End Date Taking? Authorizing Provider  acetaminophen  (TYLENOL ) 500 MG tablet Take 1,000 mg by mouth 2 (two) times daily.       [provider]  aspirin  EC 81 MG tablet Take 81 mg by mouth daily. Swallow whole.       [provider]  atorvastatin  (LIPITOR) 20 MG tablet Take 20 mg by mouth daily.       [provider]  CINNAMON PO Take 2,000 mg by mouth daily. 1000 mg each       [provider]  ferrous sulfate 325 (65 FE) MG EC tablet Take 325 mg by mouth daily with breakfast.        [provider]  Lidocaine  HCl (ASPERCREME LIDOCAINE ) 4 % CREA Apply 1 application  topically 2 (two) times daily. Back pain       [provider]  Magnesium  250 MG TABS Take 250 mg by mouth daily.       [provider]  Multiple Vitamins-Minerals (CENTRUM SILVER PO) Take 1 tablet by mouth daily.       [provider]  Omega-3 Fatty Acids (FISH OIL PO) Take 2,000 mg by mouth daily.       [provider]  ondansetron  (ZOFRAN ) 4 MG tablet Take 1 tablet (4 mg total) by mouth daily as needed for nausea or vomiting. Patient not taking: Reported on 01/22/2024 09/01/23 08/31/24   Jeremy Lonni Righter, MD  oxybutynin  (DITROPAN ) 5 MG tablet Take 1 tablet (5 mg total) by mouth every 8 (eight) hours as needed for bladder spasms. Patient not taking: Reported on 01/22/2024 08/30/23     Jeremy Lonni Righter, MD  phenazopyridine  (PYRIDIUM ) 200 MG tablet Take 1 tablet (200 mg total) by mouth 3 (three) times daily as needed (for pain with urination). Patient not taking: Reported on 01/22/2024 08/30/23 08/29/24   Jeremy Lonni Righter, MD  ZINC-VITAMIN C PO Take 1 tablet by mouth daily. Vitamin D       [provider]      Scheduled Meds:  aspirin   324 mg Oral Once        Continuous Infusions:  heparin  750 Units/hr (04/20/24 0251)        PRN Meds: nitroGLYCERIN        Allergies:   [Allergies]  [Allergies] No Known Allergies   Social History:   Social History         Socioeconomic History   Marital status: Married      Spouse name: Not on file   Number of children: Not on file   Years of education: Not on file   Highest education level: Not on file  Occupational History   Not on file  Tobacco Use   Smoking status: Former      Types: Cigarettes   Smokeless tobacco: Never  Vaping Use   Vaping status: Never Used  Substance and Sexual Activity   Alcohol use: Not Currently      Alcohol/week: 7.0 standard drinks of alcohol       Types: 7 Glasses of wine per week  Comment: occ at night with supper   Drug use: No   Sexual activity: Not on file  Other Topics Concern   Not on file  Social History Narrative   Not on file    Social Drivers of Health        Tobacco Use: Medium Risk (04/18/2024)    Received from [facility]    Patient History     Smoking Tobacco Use: Former     Smokeless Tobacco Use: Never     Passive Exposure: Never  Physicist, Medical Strain: Low Risk (04/18/2024)    Received from [facility]    Overall Financial Resource Strain (CARDIA)     How hard is it for you to pay for the very basics like food, housing, medical care, and heating?: Not hard at all  Food Insecurity: No Food Insecurity (04/18/2024)    Received from [facility]    Epic     Within the past 12 months, you worried that your food would run out before you got the money to buy more.: Never true     Within the past 12 months, the food you bought just didn't last and you didn't have money to get more.: Never true  Transportation Needs: No Transportation Needs (04/18/2024)    Received from [facility]    Epic     In the past 12 months, has lack of transportation kept you from medical appointments or from getting medications?: No     In the past 12 months, has lack of transportation kept you from meetings, work, or from getting things needed for daily living?: No  Physical Activity: Not on file  Stress: Not on file  Social Connections: Unknown (08/30/2023)    Social Connection and Isolation Panel     Frequency of Communication with Friends and Family: More than three times a week     Frequency of Social Gatherings with Friends and Family: More than three times a week     Attends Religious Services: More than 4 times per year     Active Member of [employer] or Organizations: Patient unable to answer     Attends Banker Meetings: Patient unable to answer     Marital Status: Married  Catering Manager Violence: Not At  Risk (08/30/2023)    Humiliation, Afraid, Rape, and Kick questionnaire     Fear of Current or Ex-Partner: No     Emotionally Abused: No     Physically Abused: No     Sexually Abused: No  Depression (PHQ2-9): Not on file  Alcohol Screen: Not on file  Housing: Low Risk (04/18/2024)    Received from [facility]     In the last 12 months, was there a time when you were not able to pay the mortgage or rent on time?: No     In the past 12 months, how many times have you moved where you were living?: 0     At any time in the past 12 months, were you homeless or living in a shelter (including now)?: No  Utilities: Not At Risk (04/18/2024)    Received from [facility]     In the past 12 months has the electric, gas, oil, or water  company threatened to shut off services in your home?: No  Health Literacy: Not on file    Family History:   Cardiac history   ROS:  Please see the history of present illness.  All other ROS reviewed and negative.      Physical Exam/Data:     Vitals:    04/20/24 0116 04/20/24 0117  BP: (!) 177/93    Pulse: 85    Resp: 16    Temp: 97.8 F (36.6 C)    TempSrc: Oral    SpO2: 100%    Weight:   61.2 kg  Height:   5' 8 (1.727 m)    No intake or output data in the 24 hours ending 04/20/24 0337     04/20/2024    1:17 AM 01/31/2024    8:24 AM 01/24/2024    1:22 PM  Last 3 Weights  Weight (lbs) 135 lb 130 lb 15.3 oz 131 lb  Weight (kg) 61.236 kg 59.4 kg 59.421 kg     Body mass index is 20.53 kg/m.  General:  Well nourished, well developed, in no acute distress HEENT: normal Neck: no JVD Vascular: No carotid bruits; Distal pulses 2+ bilaterally Cardiac:  normal S1, S2; RRR; no murmur  Lungs:  clear to auscultation bilaterally, no wheezing, rhonchi or rales  Abd: soft, nontender, no hepatomegaly  Ext: no edema Musculoskeletal:  No deformities, BUE and BLE strength normal and equal Skin: warm and dry  Neuro:  CNs 2-12 intact, no  focal abnormalities noted Psych:  Normal affect    EKG:  The EKG was personally reviewed and demonstrates: Normal sinus rhythm with deep T wave inversions V2-V4 Telemetry:  Telemetry was personally reviewed and demonstrates: Pending   Relevant CV Studies: None   Laboratory Data: High Sensitivity Troponin:   Last Labs  No results for input(s): TROPONINIHS in the last 720 hours.    Last Labs     Recent Labs  Lab 04/20/24 0128  TRNPT 119*        Chemistry Last Labs     Recent Labs  Lab 04/20/24 0128  NA 139  K 4.1  CL 100  CO2 26  GLUCOSE 141*  BUN 23  CREATININE 0.88  CALCIUM  9.4  GFRNONAA >60  ANIONGAP 12      Last Labs  No results for input(s): PROT, ALBUMIN, AST, ALT, ALKPHOS, BILITOT in the last 168 hours.   Lipids  Last Labs  No results for input(s): CHOL, TRIG, HDL, LABVLDL, LDLCALC, CHOLHDL in the last 168 hours.    Hematology Last Labs     Recent Labs  Lab 04/20/24 0128  WBC 9.2  RBC 3.75*  HGB 12.1*  HCT 36.9*  MCV 98.4  MCH 32.3  MCHC 32.8  RDW 13.7  PLT 350      Thyroid  Last Labs  No results for input(s): TSH, FREET4 in the last 168 hours.    BNP Last Labs  No results for input(s): BNP, PROBNP in the last 168 hours.    DDimer  Last Labs  No results for input(s): DDIMER in the last 168 hours.     Radiology/Studies:  DG Chest 2 View Result Date: 04/20/2024 EXAM: 2 VIEW(S) XRAY OF THE CHEST 04/20/2024 01:55:00 AM COMPARISON: None available. CLINICAL HISTORY: Chest pain. FINDINGS: LUNGS AND PLEURA: No focal pulmonary opacity. No pleural effusion. No pneumothorax. HEART AND MEDIASTINUM: No acute abnormality of the cardiac and mediastinal silhouettes. BONES AND SOFT TISSUES: Degenerative changes. IMPRESSION: 1. No acute process. Electronically signed by: Dorethia Molt MD 04/20/2024 02:21 AM EST RP Workstation: [id number]    Assessment and Plan: NSTEMI-presented with 3 days of waxing and waning  chest pain with elevated troponin and deep  T wave inversions V2-V4 insistent with NSTEMI.  Given the recent passing of his wife he is at risk for stress-induced cardiomyopathy and this remains on the differential.  For now we will treat as an acute coronary event and he will need coronary angiography to rule out an acute occlusion - Echo - Heparin  drip - 325 mg aspirin  once followed by 81 mg daily - He is unsure if he would like to pursue coronary angiography at this time and I relayed to him that he can take some time to decide given that will not be completed eminently.  I also mention to him that he may wish to discuss this with his daughter.  I made him aware that coronary angiography is not definitive procedure for his current condition and he is aware of this. - Trend troponins - Lipid panel, LP(a) Bladder cancer - follow-up outpatient     Risk Assessment/Risk Scores:    TIMI Risk Score for Unstable Angina or Non-ST Elevation MI:   The patient's TIMI risk score is 6 , which indicates a 41 % risk of all cause mortality, new or recurrent myocardial infarction or need for urgent revascularization in the next 14 days.           For questions or updates, please contact [facility] Please consult [email or web address] for contact info under          Signed, Jalaine DELENA Newcomer, MD  04/20/2024 3:37 AM   "

## 2024-04-22 ENCOUNTER — Encounter (HOSPITAL_COMMUNITY): Payer: Self-pay | Admitting: Internal Medicine

## 2024-04-22 ENCOUNTER — Inpatient Hospital Stay (HOSPITAL_COMMUNITY)

## 2024-04-22 ENCOUNTER — Other Ambulatory Visit (HOSPITAL_COMMUNITY): Payer: Self-pay

## 2024-04-22 ENCOUNTER — Telehealth (HOSPITAL_COMMUNITY): Payer: Self-pay | Admitting: Pharmacy Technician

## 2024-04-22 ENCOUNTER — Encounter (HOSPITAL_COMMUNITY): Admission: EM | Disposition: A | Payer: Self-pay | Source: Home / Self Care | Attending: Internal Medicine

## 2024-04-22 LAB — CBC
HCT: 36.2 % — ABNORMAL LOW (ref 39.0–52.0)
Hemoglobin: 12 g/dL — ABNORMAL LOW (ref 13.0–17.0)
MCH: 32.2 pg (ref 26.0–34.0)
MCHC: 33.1 g/dL (ref 30.0–36.0)
MCV: 97.1 fL (ref 80.0–100.0)
Platelets: 328 10*3/uL (ref 150–400)
RBC: 3.73 MIL/uL — ABNORMAL LOW (ref 4.22–5.81)
RDW: 13.8 % (ref 11.5–15.5)
WBC: 15.8 10*3/uL — ABNORMAL HIGH (ref 4.0–10.5)
nRBC: 0 % (ref 0.0–0.2)

## 2024-04-22 LAB — POCT ACTIVATED CLOTTING TIME
Activated Clotting Time: 230 s
Activated Clotting Time: 235 s
Activated Clotting Time: 261 s
Activated Clotting Time: 261 s

## 2024-04-22 LAB — HEPARIN LEVEL (UNFRACTIONATED): Heparin Unfractionated: 0.38 [IU]/mL (ref 0.30–0.70)

## 2024-04-22 LAB — MRSA NEXT GEN BY PCR, NASAL: MRSA by PCR Next Gen: NOT DETECTED

## 2024-04-22 MED ORDER — NITROGLYCERIN 1 MG/10 ML FOR IR/CATH LAB
INTRA_ARTERIAL | Status: AC
  Filled 2024-04-22: qty 10

## 2024-04-22 MED ORDER — LABETALOL HCL 5 MG/ML IV SOLN: 10.0000 mg | INTRAVENOUS | Status: DC | PRN

## 2024-04-22 MED ORDER — HEPARIN SODIUM (PORCINE) 1000 UNIT/ML IJ SOLN
INTRAMUSCULAR | Status: AC
  Filled 2024-04-22: qty 10

## 2024-04-22 MED ORDER — NITROGLYCERIN 0.4 MG SL SUBL
SUBLINGUAL_TABLET | SUBLINGUAL | Status: DC | PRN
  Administered 2024-04-22: .4 mg via SUBLINGUAL

## 2024-04-22 MED ORDER — IOHEXOL 350 MG/ML SOLN
INTRAVENOUS | Status: DC | PRN
  Administered 2024-04-22: 70 mL

## 2024-04-22 MED ORDER — TICAGRELOR 90 MG PO TABS
ORAL_TABLET | ORAL | Status: AC
  Filled 2024-04-22: qty 2

## 2024-04-22 MED ORDER — FENTANYL CITRATE (PF) 100 MCG/2ML IJ SOLN
INTRAMUSCULAR | Status: DC | PRN
  Administered 2024-04-22 (×4): 25 ug via INTRAVENOUS

## 2024-04-22 MED ORDER — SODIUM CHLORIDE 0.9% FLUSH: 3.0000 mL | INTRAVENOUS | Status: DC | PRN

## 2024-04-22 MED ORDER — LIDOCAINE HCL (PF) 1 % IJ SOLN
INTRAMUSCULAR | Status: AC
  Filled 2024-04-22: qty 30

## 2024-04-22 MED ORDER — SODIUM CHLORIDE 0.9% FLUSH
3.0000 mL | Freq: Two times a day (BID) | INTRAVENOUS | Status: DC
  Administered 2024-04-22 – 2024-04-23 (×4): 3 mL via INTRAVENOUS

## 2024-04-22 MED ORDER — HEPARIN SODIUM (PORCINE) 1000 UNIT/ML IJ SOLN
INTRAMUSCULAR | Status: DC | PRN
  Administered 2024-04-22: 2000 [IU] via INTRAVENOUS
  Administered 2024-04-22 (×2): 4000 [IU] via INTRAVENOUS
  Administered 2024-04-22: 3000 [IU] via INTRAVENOUS

## 2024-04-22 MED ORDER — VERAPAMIL HCL 2.5 MG/ML IV SOLN
INTRAVENOUS | Status: AC
  Filled 2024-04-22: qty 2

## 2024-04-22 MED ORDER — MIDAZOLAM HCL 2 MG/2ML IJ SOLN
INTRAMUSCULAR | Status: AC
  Filled 2024-04-22: qty 2

## 2024-04-22 MED ORDER — SODIUM CHLORIDE 0.9 % IV SOLN: 250.0000 mL | INTRAVENOUS | Status: AC | PRN

## 2024-04-22 MED ORDER — MIDAZOLAM HCL (PF) 2 MG/2ML IJ SOLN
INTRAMUSCULAR | Status: DC | PRN
  Administered 2024-04-22 (×3): 1 mg via INTRAVENOUS

## 2024-04-22 MED ORDER — OXYCODONE HCL 5 MG PO TABS: 5.0000 mg | ORAL_TABLET | ORAL | Status: DC | PRN

## 2024-04-22 MED ORDER — FREE WATER: 500.0000 mL | Freq: Once | Status: DC

## 2024-04-22 MED ORDER — NITROGLYCERIN IN D5W 200-5 MCG/ML-% IV SOLN
INTRAVENOUS | Status: AC | PRN
  Administered 2024-04-22: 10 ug/min via INTRAVENOUS

## 2024-04-22 MED ORDER — ORAL CARE MOUTH RINSE: 15.0000 mL | OROMUCOSAL | Status: DC | PRN

## 2024-04-22 MED ORDER — FENTANYL CITRATE (PF) 100 MCG/2ML IJ SOLN
INTRAMUSCULAR | Status: AC
  Filled 2024-04-22: qty 2

## 2024-04-22 MED ORDER — METOPROLOL TARTRATE 5 MG/5ML IV SOLN
5.0000 mg | Freq: Once | INTRAVENOUS | Status: AC
  Administered 2024-04-22: 5 mg via INTRAVENOUS

## 2024-04-22 MED ORDER — HYDRALAZINE HCL 20 MG/ML IJ SOLN
10.0000 mg | Freq: Four times a day (QID) | INTRAMUSCULAR | Status: DC | PRN
  Administered 2024-04-22: 10 mg via INTRAVENOUS

## 2024-04-22 MED ORDER — TICAGRELOR 90 MG PO TABS
90.0000 mg | ORAL_TABLET | Freq: Two times a day (BID) | ORAL | Status: DC
  Administered 2024-04-22 – 2024-04-24 (×4): 90 mg via ORAL
  Filled 2024-04-22 (×4): qty 1

## 2024-04-22 MED ORDER — SODIUM CHLORIDE 0.9 % IV SOLN
INTRAVENOUS | Status: AC | PRN
  Administered 2024-04-22: 10 mL/h via INTRAVENOUS

## 2024-04-22 MED ORDER — LABETALOL HCL 5 MG/ML IV SOLN: 10.0000 mg | INTRAVENOUS | Status: AC | PRN

## 2024-04-22 MED ORDER — HYDRALAZINE HCL 20 MG/ML IJ SOLN
10.0000 mg | INTRAMUSCULAR | Status: DC | PRN
  Filled 2024-04-22: qty 1

## 2024-04-22 MED ORDER — VERAPAMIL HCL 2.5 MG/ML IV SOLN
INTRAVENOUS | Status: DC | PRN
  Administered 2024-04-22: 10 mL via INTRA_ARTERIAL

## 2024-04-22 MED ORDER — HEPARIN (PORCINE) IN NACL 1000-0.9 UT/500ML-% IV SOLN
INTRAVENOUS | Status: DC | PRN
  Administered 2024-04-22 (×4): 500 mL

## 2024-04-22 MED ORDER — HYDRALAZINE HCL 20 MG/ML IJ SOLN: 10.0000 mg | INTRAMUSCULAR | Status: DC | PRN

## 2024-04-22 MED ORDER — MORPHINE SULFATE (PF) 2 MG/ML IV SOLN: 2.0000 mg | INTRAVENOUS | Status: DC | PRN

## 2024-04-22 MED ORDER — METOPROLOL TARTRATE 5 MG/5ML IV SOLN
INTRAVENOUS | Status: AC
  Administered 2024-04-22: 5 mg
  Filled 2024-04-22: qty 5

## 2024-04-22 MED ORDER — LIDOCAINE HCL (PF) 1 % IJ SOLN
INTRAMUSCULAR | Status: DC | PRN
  Administered 2024-04-22: 2 mL via INTRADERMAL
  Administered 2024-04-22: 10 mL via INTRADERMAL

## 2024-04-22 MED ORDER — SODIUM CHLORIDE 0.9% FLUSH: 3.0000 mL | Freq: Two times a day (BID) | INTRAVENOUS | Status: DC

## 2024-04-22 MED ORDER — TICAGRELOR 90 MG PO TABS
ORAL_TABLET | ORAL | Status: DC | PRN
  Administered 2024-04-22: 180 mg via ORAL

## 2024-04-22 MED ORDER — NITROGLYCERIN 1 MG/10 ML FOR IR/CATH LAB
INTRA_ARTERIAL | Status: DC | PRN
  Administered 2024-04-22: 200 ug via INTRACORONARY
  Administered 2024-04-22: 150 ug via INTRACORONARY

## 2024-04-22 MED ORDER — FREE WATER
500.0000 mL | Freq: Once | Status: AC
  Administered 2024-04-22: 500 mL via ORAL

## 2024-04-22 NOTE — Progress Notes (Addendum)
 PHARMACY - ANTICOAGULATION  Pharmacy Consult for Heparin  Indication: chest pain/ACS   Allergies[1]  Patient Measurements: Height: 5' 8 (172.7 cm) Weight: 56.7 kg (125 lb) IBW/kg (Calculated) : 68.4 HEPARIN  DW (KG): 57.8  Vital Signs: Temp: 98.7 F (37.1 C) (01/26 0721) Temp Source: Oral (01/26 0721) BP: 126/68 (01/26 0721) Pulse Rate: 90 (01/26 0721)  Labs: Recent Labs    04/20/24 0128 04/20/24 1026 04/21/24 0248 04/21/24 1711 04/22/24 0104  HGB 12.1*  --  11.4*  --  12.0*  HCT 36.9*  --  33.8*  --  36.2*  PLT 350  --  337  --  328  HEPARINUNFRC  --    < > 0.35 0.32 0.38  CREATININE 0.88  --   --   --   --    < > = values in this interval not displayed.    Estimated Creatinine Clearance: 46.5 mL/min (by C-G formula based on SCr of 0.88 mg/dL).  Assessment: 89 y.o. male with chest pain for heparin .  Heparin  level is therapeutic at 0.38, on 1150 units/hr. Hgb 12, plt 328. No s/sx of bleeding or infusion issues.   Goal of Therapy:  Heparin  level 0.3-0.7 units/ml Monitor platelets by anticoagulation protocol: Yes   Plan:  Continue heparin  1150 units/hr Daily heparin  level and CBC F/u after cath  ADDENDUM Underwent cardiac cath finding critical midLAD (99% stenosis) ultimately requiring orbital atherectomy/DES and severe distal LM stenosis (75% stenosis) - discussed with MD, no plans to restart heparin  post-cath.  Thank you for allowing pharmacy to participate in this patient's care,  Suzen Sour, PharmD, BCCCP Clinical Pharmacist  Phone: [phone] 04/22/2024 7:41 AM  Please check AMION for all [facility] phone numbers After 10:00 PM, call Main Pharmacy [phone]      [1] No Known Allergies

## 2024-04-22 NOTE — Telephone Encounter (Signed)
 Patient Product/process Development Scientist completed.    The patient is insured through [facility]. Patient has Medicare and is not eligible for a copay card, but may be able to apply for patient assistance or Medicare RX Payment Plan (Patient Must reach out to their plan, if eligible for payment plan), if available.    Ran test claim for ticagrelor  (Brilinta ) 90 mg and the current 30 day co-pay is $136.03 due to a deductible.   This test claim was processed through [facility]- copay amounts may vary at other pharmacies due to pharmacy/plan contracts, or as the patient moves through the different stages of their insurance plan.     Reyes Sharps, CPHT Pharmacy Technician Patient Advocate Specialist Lead [facility] Patient Advocate Team Direct Number: [phone]  Fax: [phone]

## 2024-04-22 NOTE — Progress Notes (Signed)
"  ° ° °  Subjective    CTSP due to c/o chest pain, starting @ 19:30 this evening.  Pt says that he had been feeling well but subsequently developed sscp and dyspnea.  SBP 160.  Sl ntg provided by nsg staff and IV ntg resumed.  C/p sl better after sl ntg.  Objective    Vitals:   04/22/24 1611 04/22/24 1850  BP:  (!) 158/80  Pulse: 87 83  Resp: 17   Temp: 97.6 F (36.4 C)   SpO2: 98%    Pleasant.  Having trouble forming sentences, aphasic.  Moves all extremities spont. CN II-XII grossly intact.  5/5 strength bilat UE/LE.  C/o sscp w/ some inc wob. Cor RRR, no murmurs. Abd soft, nt/nd/bs+x4. Ext no edema.  R groin site w/ dsg, small amt of blood noted.  No bruit.  Lab Results  Component Value Date   WBC 15.8 (H) 04/22/2024   HGB 12.0 (L) 04/22/2024   HCT 36.2 (L) 04/22/2024   MCV 97.1 04/22/2024   PLT 328 04/22/2024    Lab Results  Component Value Date   CREATININE 0.88 04/20/2024   BUN 23 04/20/2024   NA 139 04/20/2024   K 4.1 04/20/2024   CL 100 04/20/2024   CO2 26 04/20/2024   ECG - sinus tachycardia, 114, inc RBBB, RVH w/ TWI V1-V3 - similar to admission/pre-PCI ECGs  Assessment & Plan    1.  Unstable Angina/CAD:  s/p PCI/DES of mid LAD and distal LM, complicated by abrupt closure of LAD during procedure req atherectomy.  Developed recurrent c/p this evening.  ECG similar to pre-procedure ECGs but w/ recurrence of anterior TWI, which was not present on immediate post-PCI ECG (ST elevation noted on that ECG however).  He has received sl and now IV ntg, along w/ metoprolol  5 mg IV x 1 in setting of sinus tachycardia.  Following therapies, pt now says he is pain free.  Cont IV ntg tonight.  See #2.  2.  AMS/Aphasia:  in setting of c/p this evening, pt w/ trouble word finding/forming sentences.  Neuro exam otw nl.  Will obtain head CT.  Lonni Meager, NP 04/22/2024, 7:52 PM   "

## 2024-04-22 NOTE — Interval H&P Note (Signed)
 History and Physical Interval Note:  04/22/2024 9:33 AM  Jeremy Holmes  has presented today for surgery, with the diagnosis of NSTEMI.  The various methods of treatment have been discussed with the patient and family. After consideration of risks, benefits and other options for treatment, the patient has consented to  Procedures: LEFT HEART CATH AND CORONARY ANGIOGRAPHY (N/A) as a surgical intervention.  The patient's history has been reviewed, patient examined, no change in status, stable for surgery.  I have reviewed the patient's chart and labs.  Questions were answered to the patient's satisfaction.    Pt with recurrent angina. He has decided to move forward with cath/PCI. I discussed with Dr Almetta who evaluated him multiple times yesterday and ultimately consented him for cath.   [name]

## 2024-04-22 NOTE — Progress Notes (Signed)
 "  Progress Note  Patient Name: Jeremy Holmes Date of Encounter: 04/22/2024 [facility] Cardiologist: None   Interval Summary   Evaluated him earlier tonight with subtle oozing around arteriotomy site, right femoral.  Pressure dressing was applied.  He appeared comfortable at that time.  His pressure was slightly elevated at that time and IV hydralazine  10 mg as needed was added to his regimen as well as his evening dose of metoprolol  12.5 mg.    We were then called back at approximately 7:50 PM with complaint of chest discomfort, anxious.  He appeared anxious overall, complained of 4-5 out of 10 centralized chest discomfort.  On exam, no crackles, increased respiratory rate, anxious, no diaphoresis.  EKG demonstrates T wave inversion once again fairly similar to original EKG prior to heart catheterization this morning however amplitude of T waves are not quite as severe.     Vital Signs Vitals:   04/22/24 1445 04/22/24 1500 04/22/24 1611 04/22/24 1850  BP: 131/65 (!) 144/77  (!) 158/80  Pulse: 67 71 87 83  Resp: 16 20 17    Temp:   97.6 F (36.4 C)   TempSrc:   Oral   SpO2: 97% 98% 98%   Weight:   56 kg   Height:   5' 8 (1.727 m)     Intake/Output Summary (Last 24 hours) at 04/22/2024 2006 Last data filed at 04/22/2024 1627 Gross per 24 hour  Intake 1542.59 ml  Output 2150 ml  Net -607.41 ml      04/22/2024    4:11 PM 04/22/2024    5:00 AM 04/20/2024    3:16 PM  Last 3 Weights  Weight (lbs) 123 lb 7.3 oz 125 lb 127 lb 6.8 oz  Weight (kg) 56 kg 56.7 kg 57.8 kg      Telemetry/ECG  Sinus tachycardia heart rate 100-120- Personally Reviewed  Physical Exam  GEN: No acute distress.  Anxious Neck: No JVD Cardiac: Tachycardic regular, no murmurs, rubs, or gallops.  Respiratory: Clear to auscultation bilaterally. GI: Soft, nontender, non-distended  MS: No edema, urinary catheter in place.  Groin site without any evidence of hematoma.  Pulsatile femoral artery.   Subtle oozing noted previously.  Assessment & Plan   Non-STEMI - Complex left main atherectomy, 75% treated successfully with high speed orbital atherectomy and stenting 4.0 x 18 mm Onyx under IVUS guidance.  Also had critical 99% mid LAD stenosis treated with a 2.5 x 22 mm drug-eluting stent.  Nonobstructive RCA disease.  Had mild global LV dysfunction, echo showed EF of 40 to 45% in the LAD territory.  He had normal LVEDP during catheterization. -Currently on dual antiplatelet therapy with ticagrelor  and aspirin . -With his worsening symptoms, ECG currently appears fairly similar to that prior to cardiac catheterization.  There is no evidence of ST elevation as was seen surrounding his abrupt closure of mid LAD following initial balloon angioplasty. -We have started IV nitroglycerin , will administer IV metoprolol  5 mg as well to assist with antianginals.  He has received sublingual nitroglycerin .  Apparently he is also having some confusion currently and is now disoriented.  Head CT has been ordered.  Case was previously discussed with Dr. Earna updated about groin oozing.  We have also communicated to on-call physician.  We will continue to monitor.  Current CODE STATUS on chart is DNR limited.    Other medical issues include history of bladder cancer status post BCG therapy.  Wife recently passed away within the past  month.  Daughter lives 25 miles from him.  CRITICAL CARE Performed by: Oneil Parchment   Total critical care time: 40 minutes  Critical care time was exclusive of separately billable procedures and treating other patients.  Critical care was necessary to treat or prevent imminent or life-threatening deterioration.  Critical care was time spent personally by me on the following activities: development of treatment plan with patient and/or surrogate as well as nursing, discussions with consultants, evaluation of patient's response to treatment, examination of patient,  obtaining history from patient or surrogate, ordering and performing treatments and interventions, ordering and review of laboratory studies, ordering and review of radiographic studies, pulse oximetry and re-evaluation of patient's condition.       For questions or updates, please contact [facility] Please consult [email or web address] for contact info under         Signed, Oneil Parchment, MD   "

## 2024-04-22 NOTE — Progress Notes (Signed)
 Patient groin site with slow continuous ooze, no hematoma, cath lab called, MD came to bedside. New orders for a pressure dressing, 10mg  IV hydralazine  and to give night time 12.5mg  metoprolol  now.

## 2024-04-23 LAB — BASIC METABOLIC PANEL WITH GFR
Anion gap: 13 (ref 5–15)
BUN: 28 mg/dL — ABNORMAL HIGH (ref 8–23)
CO2: 23 mmol/L (ref 22–32)
Calcium: 8.8 mg/dL — ABNORMAL LOW (ref 8.9–10.3)
Chloride: 99 mmol/L (ref 98–111)
Creatinine, Ser: 0.88 mg/dL (ref 0.61–1.24)
GFR, Estimated: >60 mL/min
Glucose, Bld: 137 mg/dL — ABNORMAL HIGH (ref 70–99)
Potassium: 4 mmol/L (ref 3.5–5.1)
Sodium: 136 mmol/L (ref 135–145)

## 2024-04-23 LAB — LIPOPROTEIN A (LPA): Lipoprotein (a): 87.3 nmol/L — ABNORMAL HIGH

## 2024-04-23 LAB — CBC
HCT: 35.1 % — ABNORMAL LOW (ref 39.0–52.0)
Hemoglobin: 11.9 g/dL — ABNORMAL LOW (ref 13.0–17.0)
MCH: 31.9 pg (ref 26.0–34.0)
MCHC: 33.9 g/dL (ref 30.0–36.0)
MCV: 94.1 fL (ref 80.0–100.0)
Platelets: 340 10*3/uL (ref 150–400)
RBC: 3.73 MIL/uL — ABNORMAL LOW (ref 4.22–5.81)
RDW: 13.7 % (ref 11.5–15.5)
WBC: 19.1 10*3/uL — ABNORMAL HIGH (ref 4.0–10.5)
nRBC: 0 % (ref 0.0–0.2)

## 2024-04-23 MED ORDER — METOPROLOL TARTRATE 25 MG PO TABS
25.0000 mg | ORAL_TABLET | Freq: Two times a day (BID) | ORAL | Status: DC
  Administered 2024-04-23 – 2024-04-24 (×2): 25 mg via ORAL
  Filled 2024-04-23 (×2): qty 1

## 2024-04-23 MED ORDER — ISOSORBIDE MONONITRATE ER 30 MG PO TB24
15.0000 mg | ORAL_TABLET | Freq: Every day | ORAL | Status: DC
  Administered 2024-04-23 – 2024-04-24 (×2): 15 mg via ORAL
  Filled 2024-04-23 (×2): qty 1

## 2024-04-23 NOTE — Progress Notes (Addendum)
 CARDIAC REHAB PHASE I     Post stent/MI education including site care, restrictions, risk factors, exercise guidelines, NTG use, antiplatelet therapy importance, heart healthy diet, and CRP2 reviewed. All questions and concerns addressed. Will refer to [facility] for CRP2. Patient removed from nitro gtt this am and RN to ambulate patient.    [id number] Isaiah JAYSON [place], RN BSN 04/23/2024 11:30 AM

## 2024-04-23 NOTE — Progress Notes (Signed)
 "  Rounding Note   Patient Name: Jeremy Holmes Date of Encounter: 04/23/2024  [facility] Cardiologist: None New  Subjective Feels much better today. No chest pain or dyspnea. Notes thinking is much clearer.   Scheduled Meds:  aspirin  EC  81 mg Oral Daily   atorvastatin   40 mg Oral Daily   metoprolol  tartrate  12.5 mg Oral BID   sodium chloride  flush  3 mL Intravenous Q12H   ticagrelor   90 mg Oral BID   Continuous Infusions:  sodium chloride      sodium chloride      PRN Meds: sodium chloride , sodium chloride , acetaminophen , hydrALAZINE , morphine  injection, nitroGLYCERIN , ondansetron  (ZOFRAN ) IV, mouth rinse, oxybutynin , oxyCODONE , phenazopyridine , sodium chloride  flush   Vital Signs  Vitals:   04/22/24 2308 04/23/24 0334 04/23/24 0713 04/23/24 0808  BP: (!) 124/54 95/71 (!) 119/50 (!) 109/42  Pulse: 96 75 77 77  Resp: 16 19 18    Temp: 98.5 F (36.9 C) 99 F (37.2 C) 98.6 F (37 C)   TempSrc: Oral Oral Oral   SpO2: 97% 96% 96% 97%  Weight:      Height:        Intake/Output Summary (Last 24 hours) at 04/23/2024 0919 Last data filed at 04/23/2024 0359 Gross per 24 hour  Intake 591.22 ml  Output 1200 ml  Net -608.78 ml      04/22/2024    4:11 PM 04/22/2024    5:00 AM 04/20/2024    3:16 PM  Last 3 Weights  Weight (lbs) 123 lb 7.3 oz 125 lb 127 lb 6.8 oz  Weight (kg) 56 kg 56.7 kg 57.8 kg      Telemetry NSR. 5 beat run NSVT. Occ PVC - Personally Reviewed  ECG  NSR with deep T wave inversion in anterior leads.  - Personally Reviewed  Physical Exam  GEN: No acute distress.   Neck: No JVD Cardiac: RRR, no murmurs, rubs, or gallops. Right radial site with bruising but no hematoma. Pulse good. Right femoral site without hematoma.  Respiratory: Clear to auscultation bilaterally. GI: Soft, nontender, non-distended  MS: No edema; No deformity. Neuro:  Nonfocal  Psych: Normal affect   Labs High Sensitivity Troponin:  No results for input(s):  TROPONINIHS in the last 720 hours.  Recent Labs  Lab 04/20/24 0128 04/20/24 0318  TRNPT 119* 191*       Chemistry Recent Labs  Lab 04/20/24 0128 04/23/24 0221  NA 139 136  K 4.1 4.0  CL 100 99  CO2 26 23  GLUCOSE 141* 137*  BUN 23 28*  CREATININE 0.88 0.88  CALCIUM  9.4 8.8*  GFRNONAA >60 >60  ANIONGAP 12 13    Lipids  Recent Labs  Lab 04/20/24 0506  CHOL 117  TRIG 66  HDL 44  LDLCALC 60  CHOLHDL 2.7    Hematology Recent Labs  Lab 04/21/24 0248 04/22/24 0104 04/23/24 0221  WBC 10.7* 15.8* 19.1*  RBC 3.56* 3.73* 3.73*  HGB 11.4* 12.0* 11.9*  HCT 33.8* 36.2* 35.1*  MCV 94.9 97.1 94.1  MCH 32.0 32.2 31.9  MCHC 33.7 33.1 33.9  RDW 13.7 13.8 13.7  PLT 337 328 340   Thyroid No results for input(s): TSH, FREET4 in the last 168 hours.  BNPNo results for input(s): BNP, PROBNP in the last 168 hours.  DDimer No results for input(s): DDIMER in the last 168 hours.   Radiology  CT HEAD WO CONTRAST ( ) Result Date: 04/22/2024 CLINICAL DATA:  Initial evaluation for acute  altered mental status. EXAM: CT HEAD WITHOUT CONTRAST TECHNIQUE: Contiguous axial images were obtained from the base of the skull through the vertex without intravenous contrast. RADIATION DOSE REDUCTION: This exam was performed according to the departmental dose-optimization program which includes automated exposure control, adjustment of the mA and/or kV according to patient size and/or use of iterative reconstruction technique. COMPARISON:  None Available. FINDINGS: Brain: Cerebral volume within normal limits. Patchy hypodensity involving the supratentorial cerebral white matter, consistent with chronic small vessel ischemic disease, mild in nature. No acute intracranial hemorrhage. No acute large vessel territory infarct. No mass lesion, midline shift or mass effect. No hydrocephalus or extra-axial fluid collection. Vascular: No convincing abnormal hyperdense vessel. Calcified atherosclerosis  present at the skull base. Skull: Scalp soft tissues within normal limits. Calvarium intact. Sinuses/Orbits: Visualized globes orbital soft tissues within normal limits. Visualized paranasal sinuses are clear. Mastoid air cells and middle ear cavities are clear as well. Other: None. IMPRESSION: 1. No acute intracranial abnormality. 2. Mild chronic microvascular ischemic disease for age. Electronically Signed   By: Morene Hoard M.D.   On: 04/22/2024 23:02   CARDIAC CATHETERIZATION Result Date: 04/22/2024   Lida Cx to Prox Cx lesion is 40% stenosed.   3rd Mrg lesion is 80% stenosed.   1st Mrg lesion is 50% stenosed.   Prox RCA to Mid RCA lesion is 40% stenosed.   Prox LAD to Mid LAD lesion is 99% stenosed.   Mid LM to Dist LM lesion is 75% stenosed.   A drug-eluting stent was successfully placed using a STENT ONYX FRONTIER 2.5X22.   A drug-eluting stent was successfully placed using a STENT ONYX FRONTIER 4.0X18.   Post intervention, there is a 0% residual stenosis.   Post intervention, there is a 0% residual stenosis.   There is mild left ventricular systolic dysfunction.   LV end diastolic pressure is normal.   The left ventricular ejection fraction is 50-55% by visual estimate.   Recommend uninterrupted dual antiplatelet therapy with Aspirin  81mg  daily and Ticagrelor  90mg  twice daily for a minimum of 12 months (ACS-Class I recommendation). 1.  Critical mid LAD stenosis of 99%, initially treated with balloon angioplasty complicated by abrupt closure and ST segment elevation, ultimately treated with orbital atherectomy and stenting using a 2.5 x 22 mm Onyx DES 2.  Severe distal left main stenosis of 75%, treated successfully with high speed orbital atherectomy and stenting using a 4.0 x 18 mm Onyx DES under intravascular ultrasound guidance 3.  Moderate proximal/ostial circumflex stenosis, appropriate for medical therapy with severe ostial stenosis of OM 3 also appropriate for medical therapy 4.   Nonobstructive RCA stenosis 5.  Mild global LV dysfunction with LVEF estimated at 50%, normal LVEDP Recommendations: DAPT with aspirin  and ticagrelor  x 12 months, consider long-term P2 Y12 inhibition with left main stenting.   ECHOCARDIOGRAM COMPLETE Result Date: 04/21/2024    ECHOCARDIOGRAM REPORT   Patient Name:   Jeremy Holmes [facility] Date of Exam: 04/21/2024 Medical Rec #:  [id number]      Height:       68.0 in Accession #:    [id number]     Weight:       127.4 lb Date of Birth:  [birth year]      BSA:          1.687 m Patient Age:    89 years       BP:           169/68 mmHg Patient Gender: M  HR:           72 bpm. Exam Location:  Inpatient Procedure: 2D Echo, Cardiac Doppler and Color Doppler (Both Spectral and Color            Flow Doppler were utilized during procedure). Indications:    NSTEMI I21.4  History:        Patient has no prior history of Echocardiogram examinations.                 Signs/Symptoms:Hypertensive Heart Disease.  Sonographer:    Nathanel Devonshire Referring Phys: [id number] BELAL A SULEIMAN  Sonographer Comments: Suboptimal parasternal window. IMPRESSIONS  1. Apical wall motion abnormalities are consistent with stress cardiomyopathy or LAD artery territory ischemia, but there is alsomild hypokinesis in the right coronary artery distribution. Left ventricular ejection fraction, by estimation, is 40 to 45%.  The left ventricle has mildly decreased function. The left ventricle demonstrates regional wall motion abnormalities (see scoring diagram/findings for description). Left ventricular diastolic parameters are consistent with Grade I diastolic dysfunction (impaired relaxation). There is mild hypokinesis of the left ventricular, basal-mid inferolateral wall. There is moderate hypokinesis of the left ventricular, apical anterior wall and apical segment.  2. Right ventricular systolic function is normal. The right ventricular size is normal. Tricuspid regurgitation signal is inadequate for  assessing PA pressure.  3. The mitral valve is normal in structure. Trivial mitral valve regurgitation.  4. The aortic valve was not well visualized. There is moderate calcification of the aortic valve. Aortic valve regurgitation is mild. Aortic valve sclerosis/calcification is present, without any evidence of aortic stenosis. FINDINGS  Left Ventricle: Apical wall motion abnormalities are consistent with stress cardiomyopathy or LAD artery territory ischemia, but there is alsomild hypokinesis in the right coronary artery distribution. Left ventricular ejection fraction, by estimation, is 40 to 45%. The left ventricle has mildly decreased function. The left ventricle demonstrates regional wall motion abnormalities. Mild hypokinesis of the left ventricular, basal-mid inferolateral wall. Moderate hypokinesis of the left ventricular, apical anterior wall and apical segment. Mild hypokinesis of the left ventricular, basal inferior wall and inferoseptal wall. The left ventricular internal cavity size was normal in size. There is no left ventricular hypertrophy. Left ventricular diastolic parameters are consistent with Grade I diastolic dysfunction (impaired relaxation). Normal left ventricular filling pressure.  LV Wall Scoring: The posterior wall, apical septal segment, apical anterior segment, basal inferior segment, basal inferoseptal segment, and apex are hypokinetic. The anterior wall, antero-lateral wall, anterior septum, mid and distal inferior wall, apical lateral segment, and mid inferoseptal segment are normal. Right Ventricle: The right ventricular size is normal. No increase in right ventricular wall thickness. Right ventricular systolic function is normal. Tricuspid regurgitation signal is inadequate for assessing PA pressure. Left Atrium: Left atrial size was normal in size. Right Atrium: Right atrial size was normal in size. Pericardium: There is no evidence of pericardial effusion. Mitral Valve: The mitral  valve is normal in structure. Trivial mitral valve regurgitation. Tricuspid Valve: The tricuspid valve is normal in structure. Tricuspid valve regurgitation is not demonstrated. Aortic Valve: The aortic valve was not well visualized. There is moderate calcification of the aortic valve. Aortic valve regurgitation is mild. Aortic regurgitation PHT measures 135 msec. Aortic valve sclerosis/calcification is present, without any evidence of aortic stenosis. Aortic valve mean gradient measures 2.0 mmHg. Aortic valve peak gradient measures 4.2 mmHg. Aortic valve area, by VTI measures 3.08 cm. Pulmonic Valve: The pulmonic valve was not well visualized. Pulmonic valve regurgitation is not visualized.  No evidence of pulmonic stenosis. Aorta: The aortic root and ascending aorta are structurally normal, with no evidence of dilitation. IAS/Shunts: No atrial level shunt detected by color flow Doppler.  LEFT VENTRICLE PLAX 2D LVIDd:         4.60 cm     Diastology LVIDs:         3.30 cm     LV e' medial:    5.66 cm/s LV PW:         0.80 cm     LV E/e' medial:  8.2 LV IVS:        0.90 cm     LV e' lateral:   4.57 cm/s LVOT diam:     2.15 cm     LV E/e' lateral: 10.2 LV SV:         57 LV SV Index:   34 LVOT Area:     3.63 cm LV IVRT:       148 msec  LV Volumes (MOD) LV vol d, MOD A2C: 68.9 ml LV vol d, MOD A4C: 62.8 ml LV vol s, MOD A2C: 30.9 ml LV vol s, MOD A4C: 28.7 ml LV SV MOD A2C:     38.0 ml LV SV MOD A4C:     62.8 ml LV SV MOD BP:      34.6 ml RIGHT VENTRICLE          IVC RV Basal diam:  2.61 cm  IVC diam: 1.44 cm TAPSE (M-mode): 1.9 cm LEFT ATRIUM             Index        RIGHT ATRIUM          Index LA diam:        3.36 cm 1.99 cm/m   RA Area:     7.30 cm LA Vol (A2C):   28.0 ml 16.59 ml/m  RA Volume:   13.20 ml 7.82 ml/m LA Vol (A4C):   15.5 ml 9.19 ml/m LA Biplane Vol: 23.1 ml 13.69 ml/m  AORTIC VALVE                    PULMONIC VALVE AV Area (Vmax):    3.44 cm     PV Vmax:       0.97 m/s AV Area (Vmean):   3.33  cm     PV Peak grad:  3.8 mmHg AV Area (VTI):     3.08 cm AV Vmax:           102.00 cm/s AV Vmean:          64.900 cm/s AV VTI:            0.184 m AV Peak Grad:      4.2 mmHg AV Mean Grad:      2.0 mmHg LVOT Vmax:         96.60 cm/s LVOT Vmean:        59.500 cm/s LVOT VTI:          0.156 m LVOT/AV VTI ratio: 0.85 AI PHT:            135 msec  AORTA Ao Root diam: 2.95 cm Ao Asc diam:  3.06 cm MITRAL VALVE MV Area (PHT): 3.72 cm    SHUNTS MV Decel Time: 204 msec    Systemic VTI:  0.16 m MV E velocity: 46.60 cm/s  Systemic Diam: 2.15 cm MV A velocity: 86.50 cm/s MV E/A ratio:  0.54 Mihai Croitoru MD Electronically  signed by Jerel Balding MD Signature Date/Time: 04/21/2024/11:52:43 AM    Final     Cardiac Studies Echo: IMPRESSIONS     1. Apical wall motion abnormalities are consistent with stress  cardiomyopathy or LAD artery territory ischemia, but there is alsomild  hypokinesis in the right coronary artery distribution. Left ventricular  ejection fraction, by estimation, is 40 to 45%.   The left ventricle has mildly decreased function. The left ventricle  demonstrates regional wall motion abnormalities (see scoring  diagram/findings for description). Left ventricular diastolic parameters  are consistent with Grade I diastolic dysfunction  (impaired relaxation). There is mild hypokinesis of the left ventricular,  basal-mid inferolateral wall. There is moderate hypokinesis of the left  ventricular, apical anterior wall and apical segment.   2. Right ventricular systolic function is normal. The right ventricular  size is normal. Tricuspid regurgitation signal is inadequate for assessing  PA pressure.   3. The mitral valve is normal in structure. Trivial mitral valve  regurgitation.   4. The aortic valve was not well visualized. There is moderate  calcification of the aortic valve. Aortic valve regurgitation is mild.  Aortic valve sclerosis/calcification is present, without any evidence of   aortic stenosis.   Procedures  LEFT HEART CATH AND CORONARY ANGIOGRAPHY  CORONARY BALLOON ANGIOPLASTY  CORONARY CTO INTERVENTION  Coronary Ultrasound/IVUS  Coronary Stent Intervention w/Impella   Conclusion      Ost Cx to Prox Cx lesion is 40% stenosed.   3rd Mrg lesion is 80% stenosed.   1st Mrg lesion is 50% stenosed.   Prox RCA to Mid RCA lesion is 40% stenosed.   Prox LAD to Mid LAD lesion is 99% stenosed.   Mid LM to Dist LM lesion is 75% stenosed.   A drug-eluting stent was successfully placed using a STENT ONYX FRONTIER 2.5X22.   A drug-eluting stent was successfully placed using a STENT ONYX FRONTIER 4.0X18.   Post intervention, there is a 0% residual stenosis.   Post intervention, there is a 0% residual stenosis.   There is mild left ventricular systolic dysfunction.   LV end diastolic pressure is normal.   The left ventricular ejection fraction is 50-55% by visual estimate.   Recommend uninterrupted dual antiplatelet therapy with Aspirin  81mg  daily and Ticagrelor  90mg  twice daily for a minimum of 12 months (ACS-Class I recommendation).   1.  Critical mid LAD stenosis of 99%, initially treated with balloon angioplasty complicated by abrupt closure and ST segment elevation, ultimately treated with orbital atherectomy and stenting using a 2.5 x 22 mm Onyx DES 2.  Severe distal left main stenosis of 75%, treated successfully with high speed orbital atherectomy and stenting using a 4.0 x 18 mm Onyx DES under intravascular ultrasound guidance 3.  Moderate proximal/ostial circumflex stenosis, appropriate for medical therapy with severe ostial stenosis of OM 3 also appropriate for medical therapy 4.  Nonobstructive RCA stenosis 5.  Mild global LV dysfunction with LVEF estimated at 50%, normal LVEDP   Recommendations: DAPT with aspirin  and ticagrelor  x 12 months, consider long-term P2 Y12 inhibition with left main stenting.   Coronary Diagrams  Diagnostic Dominance:  Right  Intervention     Patient Profile   89 y.o. male admitted with chest pain, HLD, prior bladder CA s/p BCG tx who presents with intermittent chest pain and mild hsT elevation with anterior TWI consistent with NSTEMI   Assessment & Plan  NSTEMI. Peak troponin 191. Deep T wave inversion in anterior leads. By Echo LV  function reduced with EF 40-45% with anteroapical wall motion abnormality. By cath LV function looked better with EF 50-55% and normal EDP. Cuprit lesion in LAD as well as left main disease. Initially unable to dilate with balloon. Patient developed acute vessel closure in lab with ST elevation.  Underwent successful orbital atherectomy and stenting of the LAD and left main with DES x 2. Last night  had recurrent chest pain now resolved. Will continue ASA and Brilinta  for one year. Consider DAPT indefinitely given Left main stent. Will continue metoprolol  and add Imdur . Ambulate today.  Mental status changes noted last night. Resolved today. No focal Neuro changes and alert and oriented. CT head negative for acute change. Suspect a lot of this related to sedation during procedure. Will observe.  HLD on lipitor 40 mg daily. LDL 60 History of bladder CAD s/p BCG treatment.    For questions or updates, please contact [facility] Please consult [email or web address] for contact info under       Signed, Khalon Cansler, MD  04/23/2024, 9:19 AM    "

## 2024-04-23 NOTE — TOC Initial Note (Signed)
 Transition of Care [facility]) - Initial/Assessment Note    Patient Details  Name: Jeremy Holmes MRN: [id number] Date of Birth: Jan 03, 1934  Transition of Care [facility]) CM/SW Contact:    Arlana JINNY Nicholaus [place] Phone Number: [phone] 04/23/2024, 11:40 AM  Clinical Narrative:      11:39 AM- HF CSW called and left a VM. CSW/CM will continue to make attempts to make contact.   HF CSW/CM will continue to follow and monitor for dc readiness.                    Patient Goals and CMS Choice            Expected Discharge Plan and Services                                              Prior Living Arrangements/Services                       Activities of Daily Living   ADL Screening (condition at time of admission) Independently performs ADLs?: Yes (appropriate for developmental age) Is the patient deaf or have difficulty hearing?: No Does the patient have difficulty seeing, even when wearing glasses/contacts?: No Does the patient have difficulty concentrating, remembering, or making decisions?: No  Permission Sought/Granted                  Emotional Assessment              Admission diagnosis:  Acute coronary syndrome [facility]) [I24.9] NSTEMI (non-ST elevated myocardial infarction) [facility]) [I21.4] Patient Active Problem List   Diagnosis Date Noted   NSTEMI (non-ST elevated myocardial infarction) (HCC) 04/20/2024   Bladder cancer (HCC) 08/30/2023   Iron deficiency anemia due to chronic blood loss 08/26/2022   Right inguinal hernia 04/23/2021   Early stage nonexudative age-related macular degeneration of both eyes 04/20/2021   History of vitrectomy 04/20/2021   Pyogenic arthritis of right knee joint (HCC) 09/19/2020   Medication monitoring encounter 09/02/2020   Septic arthritis of knee, right (HCC) 08/19/2020   Pes planus of left foot 03/12/2020   Unilateral primary osteoarthritis, right knee 10/15/2019   Baker cyst, right 10/15/2019    Generalized anxiety disorder 06/07/2014   Adjustment disorder with depressed mood 06/07/2014   PCP:  Frederik Charleston, MD Pharmacy:   [facility] [address] [place] - [address] [place] [address] Phone: [phone] Fax: [phone]     Social Drivers of Health (SDOH) Social History: SDOH Screenings   Food Insecurity: No Food Insecurity (04/21/2024)  Housing: Low Risk (04/21/2024)  Transportation Needs: No Transportation Needs (04/21/2024)  Utilities: Not At Risk (04/21/2024)  Financial Resource Strain: Low Risk (04/18/2024)   Received from [facility]  Social Connections: Moderately Isolated (04/21/2024)  Tobacco Use: Medium Risk (04/22/2024)   SDOH Interventions:     Readmission Risk Interventions     No data to display

## 2024-04-24 ENCOUNTER — Telehealth: Payer: Self-pay | Admitting: Cardiology

## 2024-04-24 ENCOUNTER — Other Ambulatory Visit (HOSPITAL_COMMUNITY): Payer: Self-pay

## 2024-04-24 DIAGNOSIS — I5021 Acute systolic (congestive) heart failure: Secondary | ICD-10-CM | POA: Insufficient documentation

## 2024-04-24 DIAGNOSIS — E785 Hyperlipidemia, unspecified: Secondary | ICD-10-CM | POA: Insufficient documentation

## 2024-04-24 LAB — CBC
HCT: 33.3 % — ABNORMAL LOW (ref 39.0–52.0)
Hemoglobin: 11.5 g/dL — ABNORMAL LOW (ref 13.0–17.0)
MCH: 32.4 pg (ref 26.0–34.0)
MCHC: 34.5 g/dL (ref 30.0–36.0)
MCV: 93.8 fL (ref 80.0–100.0)
Platelets: 320 10*3/uL (ref 150–400)
RBC: 3.55 MIL/uL — ABNORMAL LOW (ref 4.22–5.81)
RDW: 13.8 % (ref 11.5–15.5)
WBC: 16.3 10*3/uL — ABNORMAL HIGH (ref 4.0–10.5)
nRBC: 0 % (ref 0.0–0.2)

## 2024-04-24 MED ORDER — METOPROLOL TARTRATE 25 MG PO TABS
25.0000 mg | ORAL_TABLET | Freq: Two times a day (BID) | ORAL | 1 refills | Status: AC
  Filled 2024-04-24: qty 60, 30d supply, fill #0

## 2024-04-24 MED ORDER — ATORVASTATIN CALCIUM 40 MG PO TABS
40.0000 mg | ORAL_TABLET | Freq: Every day | ORAL | 1 refills | Status: AC
  Filled 2024-04-24: qty 90, 90d supply, fill #0

## 2024-04-24 MED ORDER — TICAGRELOR 90 MG PO TABS
90.0000 mg | ORAL_TABLET | Freq: Two times a day (BID) | ORAL | 2 refills | Status: AC
  Filled 2024-04-24: qty 180, 90d supply, fill #0

## 2024-04-24 MED ORDER — NITROGLYCERIN 0.4 MG SL SUBL
0.4000 mg | SUBLINGUAL_TABLET | SUBLINGUAL | 2 refills | Status: AC | PRN
  Filled 2024-04-24: qty 25, 7d supply, fill #0

## 2024-04-24 MED ORDER — ISOSORBIDE MONONITRATE ER 30 MG PO TB24
15.0000 mg | ORAL_TABLET | Freq: Every day | ORAL | 1 refills | Status: AC
  Filled 2024-04-24: qty 30, 60d supply, fill #0

## 2024-04-24 NOTE — Telephone Encounter (Signed)
"  ° °  Transition of Care Follow-up Phone Call Request    Patient Name: Jeremy Holmes Date of Birth: 1933-07-25 Date of Encounter: 04/24/2024  Primary Care Provider:  Frederik Charleston, MD Primary Cardiologist:  Jaxten Jordan, MD  Jeremy Holmes has been scheduled for a transition of care follow up appointment with a HeartCare provider:  Damien Braver 2/6  Please reach out to Jeremy Holmes within 48 hours of discharge to confirm appointment and review transition of care protocol questionnaire. Anticipated discharge date: 1/28  Manuelita Rummer, NP  04/24/2024, 9:55 AM    "

## 2024-04-24 NOTE — Discharge Summary (Signed)
 " Discharge Summary   Patient ID: Jeremy Holmes MRN: [id number]; DOB: 07/01/1933  Admit date: 04/20/2024 Discharge date: 04/24/2024  PCP:  Frederik Charleston, MD   [facility] Providers Cardiologist:  Harper Jordan, MD     Discharge Diagnoses  Principal Problem:   NSTEMI (non-ST elevated myocardial infarction) [facility]) Active Problems:   Acute heart failure with mildly reduced ejection fraction (HFmrEF, 41-49%) (HCC)   Bladder cancer [facility])   Hyperlipidemia   Diagnostic Studies/Procedures   Cath: 04/22/2024    Ost Cx to Prox Cx lesion is 40% stenosed.   3rd Mrg lesion is 80% stenosed.   1st Mrg lesion is 50% stenosed.   Prox RCA to Mid RCA lesion is 40% stenosed.   Prox LAD to Mid LAD lesion is 99% stenosed.   Mid LM to Dist LM lesion is 75% stenosed.   A drug-eluting stent was successfully placed using a STENT ONYX FRONTIER 2.5X22.   A drug-eluting stent was successfully placed using a STENT ONYX FRONTIER 4.0X18.   Post intervention, there is a 0% residual stenosis.   Post intervention, there is a 0% residual stenosis.   There is mild left ventricular systolic dysfunction.   LV end diastolic pressure is normal.   The left ventricular ejection fraction is 50-55% by visual estimate.   Recommend uninterrupted dual antiplatelet therapy with Aspirin  81mg  daily and Ticagrelor  90mg  twice daily for a minimum of 12 months (ACS-Class I recommendation).   1.  Critical mid LAD stenosis of 99%, initially treated with balloon angioplasty complicated by abrupt closure and ST segment elevation, ultimately treated with orbital atherectomy and stenting using a 2.5 x 22 mm Onyx DES 2.  Severe distal left main stenosis of 75%, treated successfully with high speed orbital atherectomy and stenting using a 4.0 x 18 mm Onyx DES under intravascular ultrasound guidance 3.  Moderate proximal/ostial circumflex stenosis, appropriate for medical therapy with severe ostial stenosis of OM 3 also  appropriate for medical therapy 4.  Nonobstructive RCA stenosis 5.  Mild global LV dysfunction with LVEF estimated at 50%, normal LVEDP   Recommendations: DAPT with aspirin  and ticagrelor  x 12 months, consider long-term P2 Y12 inhibition with left main stenting.   Diagnostic Dominance: Right  Intervention   Echo: 04/21/2024  IMPRESSIONS     1. Apical wall motion abnormalities are consistent with stress  cardiomyopathy or LAD artery territory ischemia, but there is alsomild  hypokinesis in the right coronary artery distribution. Left ventricular  ejection fraction, by estimation, is 40 to 45%.   The left ventricle has mildly decreased function. The left ventricle  demonstrates regional wall motion abnormalities (see scoring  diagram/findings for description). Left ventricular diastolic parameters  are consistent with Grade I diastolic dysfunction  (impaired relaxation). There is mild hypokinesis of the left ventricular,  basal-mid inferolateral wall. There is moderate hypokinesis of the left  ventricular, apical anterior wall and apical segment.   2. Right ventricular systolic function is normal. The right ventricular  size is normal. Tricuspid regurgitation signal is inadequate for assessing  PA pressure.   3. The mitral valve is normal in structure. Trivial mitral valve  regurgitation.   4. The aortic valve was not well visualized. There is moderate  calcification of the aortic valve. Aortic valve regurgitation is mild.  Aortic valve sclerosis/calcification is present, without any evidence of  aortic stenosis.   FINDINGS   Left Ventricle: Apical wall motion abnormalities are consistent with  stress cardiomyopathy or LAD artery territory ischemia,  but there is  alsomild hypokinesis in the right coronary artery distribution. Left  ventricular ejection fraction, by estimation,  is 40 to 45%. The left ventricle has mildly decreased function. The left  ventricle demonstrates  regional wall motion abnormalities. Mild  hypokinesis of the left ventricular, basal-mid inferolateral wall.  Moderate hypokinesis of the left ventricular,  apical anterior wall and apical segment. Mild hypokinesis of the left  ventricular, basal inferior wall and inferoseptal wall. The left  ventricular internal cavity size was normal in size. There is no left  ventricular hypertrophy. Left ventricular  diastolic parameters are consistent with Grade I diastolic dysfunction  (impaired relaxation). Normal left ventricular filling pressure.     LV Wall Scoring:  The posterior wall, apical septal segment, apical anterior segment, basal  inferior segment, basal inferoseptal segment, and apex are hypokinetic.  The  anterior wall, antero-lateral wall, anterior septum, mid and distal  inferior  wall, apical lateral segment, and mid inferoseptal segment are normal.   Right Ventricle: The right ventricular size is normal. No increase in  right ventricular wall thickness. Right ventricular systolic function is  normal. Tricuspid regurgitation signal is inadequate for assessing PA  pressure.   Left Atrium: Left atrial size was normal in size.   Right Atrium: Right atrial size was normal in size.   Pericardium: There is no evidence of pericardial effusion.   Mitral Valve: The mitral valve is normal in structure. Trivial mitral  valve regurgitation.   Tricuspid Valve: The tricuspid valve is normal in structure. Tricuspid  valve regurgitation is not demonstrated.   Aortic Valve: The aortic valve was not well visualized. There is moderate  calcification of the aortic valve. Aortic valve regurgitation is mild.  Aortic regurgitation PHT measures 135 msec. Aortic valve  sclerosis/calcification is present, without any  evidence of aortic stenosis. Aortic valve mean gradient measures 2.0 mmHg.  Aortic valve peak gradient measures 4.2 mmHg. Aortic valve area, by VTI  measures 3.08 cm.    Pulmonic Valve: The pulmonic valve was not well visualized. Pulmonic valve  regurgitation is not visualized. No evidence of pulmonic stenosis.   Aorta: The aortic root and ascending aorta are structurally normal, with  no evidence of dilitation.   IAS/Shunts: No atrial level shunt detected by color flow Doppler.   _____________   History of Present Illness   Jeremy Holmes is a 89 y.o. male with past medical history of bladder cancer who presented on 1/24 with complaints of chest pain.  Stated he had 3 days of waxing and waning chest pain with associated dyspnea.  Pain was substernal with no radiation but associated dyspnea.  Initially thought it was indigestion as he noted after eating.  The night of admission symptoms worsened and he presented to the ED.  Found to have a non-STEMI with T wave inversions in V2-V4.  He was admitted for further evaluation, plans for cardiac catheterization.   Hospital Course    NSTEMI -- High-sensitivity troponin peaked at 191 with deep T wave inversion in anterior leads.  Underwent cardiac catheterization 1/26 with culprit lesion in the LAD as well as left main.  Initially unable to dilate with balloon and patient developed acute vessel closure while in the lab with ST elevation.  Did have successful orbital atherectomy and stenting of the LAD and left main with DES x 2.  Recommendations for DAPT with aspirin /Brilinta  for at least 1 year, consider indefinitely given left main stenting.  Did have recurrent chest pain post  cath and started on Imdur .  Able to ambulate prior to discharge.  Seen by cardiac rehab prior to discharge. -- Continue aspirin , Brilinta , Lipitor, Imdur  15mg  daily   HFmrEF ICM -- Echo 1/25 with apical wall motion abnormality consistent with LAD territory ischemia, LVEF of 40 to 45%, normal RV -- GDMT: continue Toprol  XL 25mg  daily, consider ARB outpatient   HLD -- LDL 60, continue atorvastatin  40mg  daily -- FLP/LFTs in 8 weeks   Hx  of bladder CA s/p BCG treatment   Patient was seen by Dr. Jordan and deemed stable for discharge home. Follow up arranged in the office.   Did the patient have an acute coronary syndrome (MI, NSTEMI, STEMI, etc) this admission?:  Yes                               AHA/ACC ACS Clinical Performance & Quality Measures: Aspirin  prescribed? - Yes ADP Receptor Inhibitor (Plavix/Clopidogrel, Brilinta /Ticagrelor  or Effient/Prasugrel) prescribed (includes medically managed patients)? - Yes Beta Blocker prescribed? - Yes High Intensity Statin (Lipitor 40-80mg  or Crestor 20-40mg ) prescribed? - Yes EF assessed during THIS hospitalization? - Yes For EF <40%, was ACEI/ARB prescribed? - Not Applicable (EF >/= 40%) For EF <40%, Aldosterone Antagonist (Spironolactone or Eplerenone) prescribed? - Not Applicable (EF >/= 40%) Cardiac Rehab Phase II ordered (including medically managed patients)? - Yes   The patient will be scheduled for a TOC follow up appointment in 10-14 days.  A message has been sent to the [facility] and Scheduling Pool at the office where the patient should be seen for follow up.  _____________  Discharge Vitals Blood pressure 124/66, pulse 90, temperature 98.4 F (36.9 C), temperature source Oral, resp. rate 17, height 5' 8 (1.727 m), weight 56 kg, SpO2 96%.  Filed Weights   04/20/24 1516 04/22/24 0500 04/22/24 1611  Weight: 57.8 kg 56.7 kg 56 kg    Labs & Radiologic Studies  CBC Recent Labs    04/23/24 0221 04/24/24 0218  WBC 19.1* 16.3*  HGB 11.9* 11.5*  HCT 35.1* 33.3*  MCV 94.1 93.8  PLT 340 320   Basic Metabolic Panel Recent Labs    98/72/73 0221  NA 136  K 4.0  CL 99  CO2 23  GLUCOSE 137*  BUN 28*  CREATININE 0.88  CALCIUM  8.8*   Liver Function Tests No results for input(s): AST, ALT, ALKPHOS, BILITOT, PROT, ALBUMIN in the last 72 hours. No results for input(s): LIPASE, AMYLASE in the last 72 hours. High Sensitivity Troponin:   No results  for input(s): TROPONINIHS in the last 720 hours.  Recent Labs  Lab 04/20/24 0128 04/20/24 0318  TRNPT 119* 191*    BNP Invalid input(s): POCBNP No results for input(s): PROBNP in the last 72 hours.  No results for input(s): BNP in the last 72 hours.  D-Dimer No results for input(s): DDIMER in the last 72 hours. Hemoglobin A1C No results for input(s): HGBA1C in the last 72 hours. Fasting Lipid Panel No results for input(s): CHOL, HDL, LDLCALC, TRIG, CHOLHDL, LDLDIRECT in the last 72 hours. Lipoprotein (a)  Date/Time Value Ref Range Status  04/20/2024 05:06 AM 87.3 (H) <75.0 nmol/L Final    Comment:    (NOTE) This test was developed and its performance characteristics determined by [facility]. It has not been cleared or approved by the Food and Drug Administration. Note:  Values greater than or equal to 75.0 nmol/L may  indicate an independent risk factor for CHD,       but must be evaluated with caution when applied       to non-Caucasian populations due to the       influence of genetic factors on Lp(a) across       ethnicities. Performed At: [facility] [address] [place], [place] [id number] Jennette Shorter MD Ey:[id number]     Thyroid Function Tests No results for input(s): TSH, T4TOTAL, T3FREE, THYROIDAB in the last 72 hours.  Invalid input(s): FREET3 _____________  CARDIAC CATHETERIZATION Result Date: 04/23/2024   Lida Cx to Prox Cx lesion is 40% stenosed.   3rd Mrg lesion is 80% stenosed.   1st Mrg lesion is 50% stenosed.   Prox RCA to Mid RCA lesion is 40% stenosed.   Prox LAD to Mid LAD lesion is 99% stenosed.   Mid LM to Dist LM lesion is 75% stenosed.   A drug-eluting stent was successfully placed using a STENT ONYX FRONTIER 2.5X22.   A drug-eluting stent was successfully placed using a STENT ONYX FRONTIER 4.0X18.   Post intervention, there is a 0% residual stenosis.   Post intervention, there is a 0% residual stenosis.    There is mild left ventricular systolic dysfunction.   LV end diastolic pressure is normal.   The left ventricular ejection fraction is 50-55% by visual estimate.   Recommend uninterrupted dual antiplatelet therapy with Aspirin  81mg  daily and Ticagrelor  90mg  twice daily for a minimum of 12 months (ACS-Class I recommendation). 1.  Critical mid LAD stenosis of 99%, initially treated with balloon angioplasty complicated by abrupt closure and ST segment elevation, ultimately treated with orbital atherectomy and stenting using a 2.5 x 22 mm Onyx DES 2.  Severe distal left main stenosis of 75%, treated successfully with high speed orbital atherectomy and stenting using a 4.0 x 18 mm Onyx DES under intravascular ultrasound guidance 3.  Moderate proximal/ostial circumflex stenosis, appropriate for medical therapy with severe ostial stenosis of OM 3 also appropriate for medical therapy 4.  Nonobstructive RCA stenosis 5.  Mild global LV dysfunction with LVEF estimated at 50%, normal LVEDP Recommendations: DAPT with aspirin  and ticagrelor  x 12 months, consider long-term P2 Y12 inhibition with left main stenting.   CT HEAD WO CONTRAST ( ) Result Date: 04/22/2024 CLINICAL DATA:  Initial evaluation for acute altered mental status. EXAM: CT HEAD WITHOUT CONTRAST TECHNIQUE: Contiguous axial images were obtained from the base of the skull through the vertex without intravenous contrast. RADIATION DOSE REDUCTION: This exam was performed according to the departmental dose-optimization program which includes automated exposure control, adjustment of the mA and/or kV according to patient size and/or use of iterative reconstruction technique. COMPARISON:  None Available. FINDINGS: Brain: Cerebral volume within normal limits. Patchy hypodensity involving the supratentorial cerebral white matter, consistent with chronic small vessel ischemic disease, mild in nature. No acute intracranial hemorrhage. No acute large vessel territory  infarct. No mass lesion, midline shift or mass effect. No hydrocephalus or extra-axial fluid collection. Vascular: No convincing abnormal hyperdense vessel. Calcified atherosclerosis present at the skull base. Skull: Scalp soft tissues within normal limits. Calvarium intact. Sinuses/Orbits: Visualized globes orbital soft tissues within normal limits. Visualized paranasal sinuses are clear. Mastoid air cells and middle ear cavities are clear as well. Other: None. IMPRESSION: 1. No acute intracranial abnormality. 2. Mild chronic microvascular ischemic disease for age. Electronically Signed   By: Morene Hoard M.D.   On: 04/22/2024 23:02   ECHOCARDIOGRAM COMPLETE Result Date:  04/21/2024    ECHOCARDIOGRAM REPORT   Patient Name:   Jeremy Holmes [facility] Date of Exam: 04/21/2024 Medical Rec #:  [id number]      Height:       68.0 in Accession #:    [id number]     Weight:       127.4 lb Date of Birth:  06-21-1933      BSA:          1.687 m Patient Age:    89 years       BP:           169/68 mmHg Patient Gender: M              HR:           72 bpm. Exam Location:  Inpatient Procedure: 2D Echo, Cardiac Doppler and Color Doppler (Both Spectral and Color            Flow Doppler were utilized during procedure). Indications:    NSTEMI I21.4  History:        Patient has no prior history of Echocardiogram examinations.                 Signs/Symptoms:Hypertensive Heart Disease.  Sonographer:    Nathanel Devonshire Referring Phys: [id number] BELAL A SULEIMAN  Sonographer Comments: Suboptimal parasternal window. IMPRESSIONS  1. Apical wall motion abnormalities are consistent with stress cardiomyopathy or LAD artery territory ischemia, but there is alsomild hypokinesis in the right coronary artery distribution. Left ventricular ejection fraction, by estimation, is 40 to 45%.  The left ventricle has mildly decreased function. The left ventricle demonstrates regional wall motion abnormalities (see scoring diagram/findings for description). Left  ventricular diastolic parameters are consistent with Grade I diastolic dysfunction (impaired relaxation). There is mild hypokinesis of the left ventricular, basal-mid inferolateral wall. There is moderate hypokinesis of the left ventricular, apical anterior wall and apical segment.  2. Right ventricular systolic function is normal. The right ventricular size is normal. Tricuspid regurgitation signal is inadequate for assessing PA pressure.  3. The mitral valve is normal in structure. Trivial mitral valve regurgitation.  4. The aortic valve was not well visualized. There is moderate calcification of the aortic valve. Aortic valve regurgitation is mild. Aortic valve sclerosis/calcification is present, without any evidence of aortic stenosis. FINDINGS  Left Ventricle: Apical wall motion abnormalities are consistent with stress cardiomyopathy or LAD artery territory ischemia, but there is alsomild hypokinesis in the right coronary artery distribution. Left ventricular ejection fraction, by estimation, is 40 to 45%. The left ventricle has mildly decreased function. The left ventricle demonstrates regional wall motion abnormalities. Mild hypokinesis of the left ventricular, basal-mid inferolateral wall. Moderate hypokinesis of the left ventricular, apical anterior wall and apical segment. Mild hypokinesis of the left ventricular, basal inferior wall and inferoseptal wall. The left ventricular internal cavity size was normal in size. There is no left ventricular hypertrophy. Left ventricular diastolic parameters are consistent with Grade I diastolic dysfunction (impaired relaxation). Normal left ventricular filling pressure.  LV Wall Scoring: The posterior wall, apical septal segment, apical anterior segment, basal inferior segment, basal inferoseptal segment, and apex are hypokinetic. The anterior wall, antero-lateral wall, anterior septum, mid and distal inferior wall, apical lateral segment, and mid inferoseptal segment  are normal. Right Ventricle: The right ventricular size is normal. No increase in right ventricular wall thickness. Right ventricular systolic function is normal. Tricuspid regurgitation signal is inadequate for assessing PA pressure. Left Atrium: Left atrial size was normal in size.  Right Atrium: Right atrial size was normal in size. Pericardium: There is no evidence of pericardial effusion. Mitral Valve: The mitral valve is normal in structure. Trivial mitral valve regurgitation. Tricuspid Valve: The tricuspid valve is normal in structure. Tricuspid valve regurgitation is not demonstrated. Aortic Valve: The aortic valve was not well visualized. There is moderate calcification of the aortic valve. Aortic valve regurgitation is mild. Aortic regurgitation PHT measures 135 msec. Aortic valve sclerosis/calcification is present, without any evidence of aortic stenosis. Aortic valve mean gradient measures 2.0 mmHg. Aortic valve peak gradient measures 4.2 mmHg. Aortic valve area, by VTI measures 3.08 cm. Pulmonic Valve: The pulmonic valve was not well visualized. Pulmonic valve regurgitation is not visualized. No evidence of pulmonic stenosis. Aorta: The aortic root and ascending aorta are structurally normal, with no evidence of dilitation. IAS/Shunts: No atrial level shunt detected by color flow Doppler.  LEFT VENTRICLE PLAX 2D LVIDd:         4.60 cm     Diastology LVIDs:         3.30 cm     LV e' medial:    5.66 cm/s LV PW:         0.80 cm     LV E/e' medial:  8.2 LV IVS:        0.90 cm     LV e' lateral:   4.57 cm/s LVOT diam:     2.15 cm     LV E/e' lateral: 10.2 LV SV:         57 LV SV Index:   34 LVOT Area:     3.63 cm LV IVRT:       148 msec  LV Volumes (MOD) LV vol d, MOD A2C: 68.9 ml LV vol d, MOD A4C: 62.8 ml LV vol s, MOD A2C: 30.9 ml LV vol s, MOD A4C: 28.7 ml LV SV MOD A2C:     38.0 ml LV SV MOD A4C:     62.8 ml LV SV MOD BP:      34.6 ml RIGHT VENTRICLE          IVC RV Basal diam:  2.61 cm  IVC diam: 1.44  cm TAPSE (M-mode): 1.9 cm LEFT ATRIUM             Index        RIGHT ATRIUM          Index LA diam:        3.36 cm 1.99 cm/m   RA Area:     7.30 cm LA Vol (A2C):   28.0 ml 16.59 ml/m  RA Volume:   13.20 ml 7.82 ml/m LA Vol (A4C):   15.5 ml 9.19 ml/m LA Biplane Vol: 23.1 ml 13.69 ml/m  AORTIC VALVE                    PULMONIC VALVE AV Area (Vmax):    3.44 cm     PV Vmax:       0.97 m/s AV Area (Vmean):   3.33 cm     PV Peak grad:  3.8 mmHg AV Area (VTI):     3.08 cm AV Vmax:           102.00 cm/s AV Vmean:          64.900 cm/s AV VTI:            0.184 m AV Peak Grad:      4.2 mmHg AV Mean Grad:  2.0 mmHg LVOT Vmax:         96.60 cm/s LVOT Vmean:        59.500 cm/s LVOT VTI:          0.156 m LVOT/AV VTI ratio: 0.85 AI PHT:            135 msec  AORTA Ao Root diam: 2.95 cm Ao Asc diam:  3.06 cm MITRAL VALVE MV Area (PHT): 3.72 cm    SHUNTS MV Decel Time: 204 msec    Systemic VTI:  0.16 m MV E velocity: 46.60 cm/s  Systemic Diam: 2.15 cm MV A velocity: 86.50 cm/s MV E/A ratio:  0.54 Mihai Croitoru MD Electronically signed by Jerel Balding MD Signature Date/Time: 04/21/2024/11:52:43 AM    Final    DG Chest 2 View Result Date: 04/20/2024 EXAM: 2 VIEW(S) XRAY OF THE CHEST 04/20/2024 01:55:00 AM COMPARISON: None available. CLINICAL HISTORY: Chest pain. FINDINGS: LUNGS AND PLEURA: No focal pulmonary opacity. No pleural effusion. No pneumothorax. HEART AND MEDIASTINUM: No acute abnormality of the cardiac and mediastinal silhouettes. BONES AND SOFT TISSUES: Degenerative changes. IMPRESSION: 1. No acute process. Electronically signed by: Dorethia Molt MD 04/20/2024 02:21 AM EST RP Workstation: [id number]    Disposition Pt is being discharged home today in good condition.  Follow-up Plans & Appointments  Discharge Instructions     Amb Referral to Cardiac Rehabilitation   Complete by: As directed    Diagnosis:  NSTEMI Coronary Stents     After initial evaluation and assessments completed: Virtual  Based Care may be provided alone or in conjunction with Phase 2 Cardiac Rehab based on patient barriers.: Yes   Intensive Cardiac Rehabilitation (ICR) [facility] location only OR Traditional Cardiac Rehabilitation (TCR) *If criteria for ICR are not met will enroll in TCR [facility] only): Yes   Call MD for:  difficulty breathing, headache or visual disturbances   Complete by: As directed    Call MD for:  persistant dizziness or light-headedness   Complete by: As directed    Call MD for:  redness, tenderness, or signs of infection (pain, swelling, redness, odor or green/yellow discharge around incision site)   Complete by: As directed    Discharge instructions   Complete by: As directed    Groin Site Care Refer to this sheet in the next few weeks. These instructions provide you with information on caring for yourself after your procedure. Your caregiver may also give you more specific instructions. Your treatment has been planned according to current medical practices, but problems sometimes occur. Call your caregiver if you have any problems or questions after your procedure. HOME CARE INSTRUCTIONS You may shower 24 hours after the procedure. Remove the bandage (dressing) and gently wash the site with plain soap and water . Gently pat the site dry.  Do not apply powder or lotion to the site.  Do not sit in a bathtub, swimming pool, or whirlpool for 5 to 7 days.  No bending, squatting, or lifting anything over 10 pounds (4.5 kg) as directed by your caregiver.  Inspect the site at least twice daily.  Do not drive home if you are discharged the same day of the procedure. Have someone else drive you.  You may drive 24 hours after the procedure unless otherwise instructed by your caregiver.  What to expect: Any bruising will usually fade within 1 to 2 weeks.  Blood that collects in the tissue (hematoma) may be painful to the touch. It should usually decrease in size and  tenderness within 1 to 2 weeks.  SEEK  IMMEDIATE MEDICAL CARE IF: You have unusual pain at the groin site or down the affected leg.  You have redness, warmth, swelling, or pain at the groin site.  You have drainage (other than a small amount of blood on the dressing).  You have chills.  You have a fever or persistent symptoms for more than 72 hours.  You have a fever and your symptoms suddenly get worse.  Your leg becomes pale, cool, tingly, or numb.  You have heavy bleeding from the site. Hold pressure on the site. SABRA  PLEASE DO NOT MISS ANY DOSES OF YOUR BRILINTA !!!!! Also keep a log of you blood pressures and bring back to your follow up appt. Please call the office with any questions.   Patients taking blood thinners should generally stay away from medicines like ibuprofen, Advil, Motrin, naproxen, and Aleve due to risk of stomach bleeding. You may take Tylenol  as directed or talk to your primary doctor about alternatives.   PLEASE ENSURE THAT YOU DO NOT RUN OUT OF YOUR BRILINTA . This medication is very important to remain on for at least one year. IF you have issues obtaining this medication due to cost please CALL the office 3-5 business days prior to running out in order to prevent missing doses of this medication.   Increase activity slowly   Complete by: As directed        Discharge Medications Allergies as of 04/24/2024   No Known Allergies      Medication List     TAKE these medications    acetaminophen  500 MG tablet Commonly known as: TYLENOL  Take 1,000 mg by mouth 2 (two) times daily as needed for mild pain (pain score 1-3) or headache.   ascorbic acid 1000 MG tablet Commonly known as: VITAMIN C Take 1,000 mg by mouth daily.   Aspercreme Lidocaine  4 % Crea Generic drug: Lidocaine  HCl Apply 1 application  topically daily as needed (muscle and joint pain). Back pain   aspirin  EC 81 MG tablet Take 81 mg by mouth daily. Swallow whole.   atorvastatin  40 MG tablet Commonly known as: LIPITOR Take 1  tablet (40 mg total) by mouth daily. What changed:  medication strength how much to take   CENTRUM SILVER PO Take 1 tablet by mouth daily.   Cinnamon 500 MG capsule Take 1,000 mg by mouth daily.   ferrous sulfate 325 (65 FE) MG EC tablet Take 325 mg by mouth daily with breakfast.   Fish Oil 1200 MG Caps Take 1,200 mg by mouth daily.   isosorbide  mononitrate 30 MG 24 hr tablet Commonly known as: IMDUR  Take 0.5 tablets (15 mg total) by mouth daily.   Magnesium  250 MG Tabs Take 250 mg by mouth daily.   metoprolol  tartrate 25 MG tablet Commonly known as: LOPRESSOR  Take 1 tablet (25 mg total) by mouth 2 (two) times daily.   nitroGLYCERIN  0.4 MG SL tablet Commonly known as: NITROSTAT  Place 1 tablet (0.4 mg total) under the tongue every 5 (five) minutes as needed for chest pain.   ticagrelor  90 MG Tabs tablet Commonly known as: BRILINTA  Take 1 tablet (90 mg total) by mouth 2 (two) times daily.         Outstanding Labs/Studies  FLP/LFTs in 8 weeks  Duration of Discharge Encounter: APP Time: 20 minutes   Signed, Manuelita Rummer, NP 04/24/2024, 9:58 AM     "

## 2024-04-24 NOTE — Progress Notes (Signed)
 Mobility Specialist Progress Note;    04/24/24 0902  Mobility  Activity Ambulated with assistance  Level of Assistance Contact guard assist, steadying assist  Assistive Device None  Distance Ambulated (ft) 325 ft  Activity Response Tolerated well  Mobility Referral Yes  Mobility Specialist Start Time (ACUTE ONLY) 0902  Mobility Specialist Stop Time (ACUTE ONLY) 0914  Mobility Specialist Time Calculation (min) (ACUTE ONLY) 12 min   Pt received in bed agreeable to mobility. No physical assistance required, CGA for safety. Upon ambulation pt complained of discomfort in his feet d/t wearing socks and not having enough cushion. VSS with no complaints of shortness of breath, HR reached 106. Pt returned to bed with call bell and personal belongings in reach.   Ricky Janeal MATSU, BS Mobility Specialist Please contact via [employer] Educational Needs Teacher or [employer] [phone]

## 2024-04-24 NOTE — Telephone Encounter (Signed)
 Pt daughter called in stating pt's forearm is bruised. She states they took the tape off. Pt is not in pain, no bleeding, and no sign of an infection. She asked if he needs to come in sooner. Pt has TOC appt w/ Monge, NP 05/03/24. Please advise.

## 2024-04-24 NOTE — Discharge Instructions (Signed)
 Jeremy Holmes

## 2024-04-24 NOTE — Telephone Encounter (Signed)
 Left message to call back

## 2024-04-24 NOTE — Care Management Important Message (Signed)
 Important Message  Patient Details  Name: Jeremy Holmes MRN: [id number] Date of Birth: 01-12-1934   Important Message Given:  Yes - Medicare IM  Patient left prior to IM delivery will mail a copy to the patient home address.   Atzin Buchta 04/24/2024, 3:30 PM

## 2024-04-24 NOTE — Progress Notes (Signed)
 "  Rounding Note   Patient Name: Jeremy Holmes Date of Encounter: 04/24/2024   [facility] Cardiologist: Jermey Closs, MD New  Subjective Feels much better today. No chest pain or dyspnea. Ambulated in halls yesterday.   Scheduled Meds:  aspirin  EC  81 mg Oral Daily   atorvastatin   40 mg Oral Daily   isosorbide  mononitrate  15 mg Oral Daily   metoprolol  tartrate  25 mg Oral BID   sodium chloride  flush  3 mL Intravenous Q12H   ticagrelor   90 mg Oral BID   Continuous Infusions:   PRN Meds: acetaminophen , morphine  injection, nitroGLYCERIN , ondansetron  (ZOFRAN ) IV, mouth rinse, oxybutynin , oxyCODONE , phenazopyridine , sodium chloride  flush   Vital Signs  Vitals:   04/23/24 1930 04/23/24 2342 04/24/24 0335 04/24/24 0727  BP: (!) 125/53 (!) 115/50 (!) 119/52 124/66  Pulse: 79 86 89 90  Resp: 17 18 15 17   Temp: 98.4 F (36.9 C) 98.4 F (36.9 C) 99.6 F (37.6 C) 98.4 F (36.9 C)  TempSrc: Oral Oral Oral Oral  SpO2:  96% 95% 96%  Weight:      Height:        Intake/Output Summary (Last 24 hours) at 04/24/2024 0813 Last data filed at 04/24/2024 0723 Gross per 24 hour  Intake 720 ml  Output 1400 ml  Net -680 ml      04/22/2024    4:11 PM 04/22/2024    5:00 AM 04/20/2024    3:16 PM  Last 3 Weights  Weight (lbs) 123 lb 7.3 oz 125 lb 127 lb 6.8 oz  Weight (kg) 56 kg 56.7 kg 57.8 kg      Telemetry NSR. Rare PVCPersonally Reviewed  ECG  NSR with deep T wave inversion in anterior leads.  - Personally Reviewed  Physical Exam  GEN: No acute distress.   Neck: No JVD Cardiac: RRR, no murmurs, rubs, or gallops. Right radial site with bruising but no hematoma. Pulse good. Right femoral site without hematoma. Looks great Respiratory: Clear to auscultation bilaterally. GI: Soft, nontender, non-distended  MS: No edema; No deformity. Neuro:  Nonfocal  Psych: Normal affect   Labs High Sensitivity Troponin:  No results for input(s): TROPONINIHS in the last 720  hours.  Recent Labs  Lab 04/20/24 0128 04/20/24 0318  TRNPT 119* 191*       Chemistry Recent Labs  Lab 04/20/24 0128 04/23/24 0221  NA 139 136  K 4.1 4.0  CL 100 99  CO2 26 23  GLUCOSE 141* 137*  BUN 23 28*  CREATININE 0.88 0.88  CALCIUM  9.4 8.8*  GFRNONAA >60 >60  ANIONGAP 12 13    Lipids  Recent Labs  Lab 04/20/24 0506  CHOL 117  TRIG 66  HDL 44  LDLCALC 60  CHOLHDL 2.7    Hematology Recent Labs  Lab 04/22/24 0104 04/23/24 0221 04/24/24 0218  WBC 15.8* 19.1* 16.3*  RBC 3.73* 3.73* 3.55*  HGB 12.0* 11.9* 11.5*  HCT 36.2* 35.1* 33.3*  MCV 97.1 94.1 93.8  MCH 32.2 31.9 32.4  MCHC 33.1 33.9 34.5  RDW 13.8 13.7 13.8  PLT 328 340 320   Thyroid No results for input(s): TSH, FREET4 in the last 168 hours.  BNPNo results for input(s): BNP, PROBNP in the last 168 hours.  DDimer No results for input(s): DDIMER in the last 168 hours.   Radiology  CARDIAC CATHETERIZATION Result Date: 04/23/2024   Lida Cx to Prox Cx lesion is 40% stenosed.   3rd Mrg lesion is 80%  stenosed.   1st Mrg lesion is 50% stenosed.   Prox RCA to Mid RCA lesion is 40% stenosed.   Prox LAD to Mid LAD lesion is 99% stenosed.   Mid LM to Dist LM lesion is 75% stenosed.   A drug-eluting stent was successfully placed using a STENT ONYX FRONTIER 2.5X22.   A drug-eluting stent was successfully placed using a STENT ONYX FRONTIER 4.0X18.   Post intervention, there is a 0% residual stenosis.   Post intervention, there is a 0% residual stenosis.   There is mild left ventricular systolic dysfunction.   LV end diastolic pressure is normal.   The left ventricular ejection fraction is 50-55% by visual estimate.   Recommend uninterrupted dual antiplatelet therapy with Aspirin  81mg  daily and Ticagrelor  90mg  twice daily for a minimum of 12 months (ACS-Class I recommendation). 1.  Critical mid LAD stenosis of 99%, initially treated with balloon angioplasty complicated by abrupt closure and ST segment elevation,  ultimately treated with orbital atherectomy and stenting using a 2.5 x 22 mm Onyx DES 2.  Severe distal left main stenosis of 75%, treated successfully with high speed orbital atherectomy and stenting using a 4.0 x 18 mm Onyx DES under intravascular ultrasound guidance 3.  Moderate proximal/ostial circumflex stenosis, appropriate for medical therapy with severe ostial stenosis of OM 3 also appropriate for medical therapy 4.  Nonobstructive RCA stenosis 5.  Mild global LV dysfunction with LVEF estimated at 50%, normal LVEDP Recommendations: DAPT with aspirin  and ticagrelor  x 12 months, consider long-term P2 Y12 inhibition with left main stenting.   CT HEAD WO CONTRAST ( ) Result Date: 04/22/2024 CLINICAL DATA:  Initial evaluation for acute altered mental status. EXAM: CT HEAD WITHOUT CONTRAST TECHNIQUE: Contiguous axial images were obtained from the base of the skull through the vertex without intravenous contrast. RADIATION DOSE REDUCTION: This exam was performed according to the departmental dose-optimization program which includes automated exposure control, adjustment of the mA and/or kV according to patient size and/or use of iterative reconstruction technique. COMPARISON:  None Available. FINDINGS: Brain: Cerebral volume within normal limits. Patchy hypodensity involving the supratentorial cerebral white matter, consistent with chronic small vessel ischemic disease, mild in nature. No acute intracranial hemorrhage. No acute large vessel territory infarct. No mass lesion, midline shift or mass effect. No hydrocephalus or extra-axial fluid collection. Vascular: No convincing abnormal hyperdense vessel. Calcified atherosclerosis present at the skull base. Skull: Scalp soft tissues within normal limits. Calvarium intact. Sinuses/Orbits: Visualized globes orbital soft tissues within normal limits. Visualized paranasal sinuses are clear. Mastoid air cells and middle ear cavities are clear as well. Other: None.  IMPRESSION: 1. No acute intracranial abnormality. 2. Mild chronic microvascular ischemic disease for age. Electronically Signed   By: Morene Hoard M.D.   On: 04/22/2024 23:02    Cardiac Studies Echo: IMPRESSIONS     1. Apical wall motion abnormalities are consistent with stress  cardiomyopathy or LAD artery territory ischemia, but there is alsomild  hypokinesis in the right coronary artery distribution. Left ventricular  ejection fraction, by estimation, is 40 to 45%.   The left ventricle has mildly decreased function. The left ventricle  demonstrates regional wall motion abnormalities (see scoring  diagram/findings for description). Left ventricular diastolic parameters  are consistent with Grade I diastolic dysfunction  (impaired relaxation). There is mild hypokinesis of the left ventricular,  basal-mid inferolateral wall. There is moderate hypokinesis of the left  ventricular, apical anterior wall and apical segment.   2. Right ventricular systolic function  is normal. The right ventricular  size is normal. Tricuspid regurgitation signal is inadequate for assessing  PA pressure.   3. The mitral valve is normal in structure. Trivial mitral valve  regurgitation.   4. The aortic valve was not well visualized. There is moderate  calcification of the aortic valve. Aortic valve regurgitation is mild.  Aortic valve sclerosis/calcification is present, without any evidence of  aortic stenosis.   Procedures  LEFT HEART CATH AND CORONARY ANGIOGRAPHY  CORONARY BALLOON ANGIOPLASTY  CORONARY CTO INTERVENTION  Coronary Ultrasound/IVUS  Coronary Stent Intervention w/Impella   Conclusion      Ost Cx to Prox Cx lesion is 40% stenosed.   3rd Mrg lesion is 80% stenosed.   1st Mrg lesion is 50% stenosed.   Prox RCA to Mid RCA lesion is 40% stenosed.   Prox LAD to Mid LAD lesion is 99% stenosed.   Mid LM to Dist LM lesion is 75% stenosed.   A drug-eluting stent was successfully  placed using a STENT ONYX FRONTIER 2.5X22.   A drug-eluting stent was successfully placed using a STENT ONYX FRONTIER 4.0X18.   Post intervention, there is a 0% residual stenosis.   Post intervention, there is a 0% residual stenosis.   There is mild left ventricular systolic dysfunction.   LV end diastolic pressure is normal.   The left ventricular ejection fraction is 50-55% by visual estimate.   Recommend uninterrupted dual antiplatelet therapy with Aspirin  81mg  daily and Ticagrelor  90mg  twice daily for a minimum of 12 months (ACS-Class I recommendation).   1.  Critical mid LAD stenosis of 99%, initially treated with balloon angioplasty complicated by abrupt closure and ST segment elevation, ultimately treated with orbital atherectomy and stenting using a 2.5 x 22 mm Onyx DES 2.  Severe distal left main stenosis of 75%, treated successfully with high speed orbital atherectomy and stenting using a 4.0 x 18 mm Onyx DES under intravascular ultrasound guidance 3.  Moderate proximal/ostial circumflex stenosis, appropriate for medical therapy with severe ostial stenosis of OM 3 also appropriate for medical therapy 4.  Nonobstructive RCA stenosis 5.  Mild global LV dysfunction with LVEF estimated at 50%, normal LVEDP   Recommendations: DAPT with aspirin  and ticagrelor  x 12 months, consider long-term P2 Y12 inhibition with left main stenting.   Coronary Diagrams  Diagnostic Dominance: Right  Intervention     Patient Profile   89 y.o. male admitted with chest pain, HLD, prior bladder CA s/p BCG tx who presents with intermittent chest pain and mild hsT elevation with anterior TWI consistent with NSTEMI   Assessment & Plan  NSTEMI. Peak troponin 191. Deep T wave inversion in anterior leads. By Echo LV function reduced with EF 40-45% with anteroapical wall motion abnormality. By cath LV function looked better with EF 50-55% and normal EDP. Cuprit lesion in LAD as well as left main disease.  Initially unable to dilate with balloon. Patient developed acute vessel closure in lab with ST elevation.  Underwent successful orbital atherectomy and stenting of the LAD and left main with DES x 2. Will continue ASA and Brilinta  for one year. Consider DAPT indefinitely given Left main stent. Will continue metoprolol  and Imdur . Patient is stable for DC from a cardiac standpoint and will arrange follow up in our office.  HLD on lipitor 40 mg daily. LDL 60 History of bladder CAD s/p BCG treatment. [facility] will sign off.   Medication Recommendations:  per [facility] Other recommendations (labs, testing, etc):  none Follow up as an outpatient:  will arrange follow up in 2-3 weeks.      For questions or updates, please contact [facility] Please consult [email or web address] for contact info under       Signed, Othel Dicostanzo, MD  04/24/2024, 8:13 AM    "

## 2024-04-25 ENCOUNTER — Telehealth (HOSPITAL_COMMUNITY): Payer: Self-pay

## 2024-04-25 NOTE — Telephone Encounter (Signed)
 Patient contacted by clinical team regarding discharge from [facility] hospital on 04/24/24  Patient understands to follow up with Damien Braver NP  on 04/24/24 at 2:45 pm at [facility].. Patient understands discharge instructions. Patient understands medications and how to take them. Patient understands to bring all medications to this visit.   Daughter Eva stated when she removed tape from right wrist he developed a bruise.Stated bruise is better today.Father is doing good today.

## 2024-04-25 NOTE — Telephone Encounter (Signed)
Patient daughter returning call  °

## 2024-04-25 NOTE — Telephone Encounter (Signed)
 Pt daughter returned call. She states the bruising isn't as bad today. She states they are no longer concerned and will keep his appt on 05/03/24.

## 2024-04-25 NOTE — Telephone Encounter (Signed)
 Attempted to call patient in regards to Cardiac Rehab - LM on VM

## 2024-04-25 NOTE — Telephone Encounter (Signed)
 First TOC call to patient no answer.Left message on personal voice mail to call back.

## 2024-04-25 NOTE — Telephone Encounter (Signed)
 Referral recv'd and verified for MD signature. Follow up appointment is on 2/6. Insurance benefits and eligibility TBD.

## 2024-04-30 ENCOUNTER — Other Ambulatory Visit: Payer: Self-pay | Admitting: Hematology and Oncology

## 2024-04-30 DIAGNOSIS — D500 Iron deficiency anemia secondary to blood loss (chronic): Secondary | ICD-10-CM

## 2024-05-01 ENCOUNTER — Inpatient Hospital Stay

## 2024-05-01 ENCOUNTER — Inpatient Hospital Stay: Admitting: Hematology and Oncology

## 2024-05-03 ENCOUNTER — Encounter: Payer: Self-pay | Admitting: Nurse Practitioner

## 2024-05-03 ENCOUNTER — Ambulatory Visit: Admitting: Nurse Practitioner

## 2024-05-03 VITALS — BP 118/60 | HR 66 | Ht 68.0 in | Wt 125.0 lb

## 2024-05-03 DIAGNOSIS — I2510 Atherosclerotic heart disease of native coronary artery without angina pectoris: Secondary | ICD-10-CM

## 2024-05-03 DIAGNOSIS — I255 Ischemic cardiomyopathy: Secondary | ICD-10-CM

## 2024-05-03 DIAGNOSIS — R7303 Prediabetes: Secondary | ICD-10-CM

## 2024-05-03 DIAGNOSIS — E785 Hyperlipidemia, unspecified: Secondary | ICD-10-CM

## 2024-05-03 NOTE — Patient Instructions (Addendum)
 Medication Instructions:  Your physician recommends that you continue on your current medications as directed. Please refer to the Current Medication list given to you today.  *If you need a refill on your cardiac medications before your next appointment, please call your pharmacy*  Lab Work: Fasting lipid panel, CMET in 6-8 weeks  Testing/Procedures: Your physician has requested that you have an echocardiogram in 3 months. Echocardiography is a painless test that uses sound waves to create images of your heart. It provides your doctor with information about the size and shape of your heart and how well your hearts chambers and valves are working. This procedure takes approximately one hour. There are no restrictions for this procedure. Please do NOT wear cologne, perfume, aftershave, or lotions (deodorant is allowed). Please arrive 15 minutes prior to your appointment time.  Please note: We ask at that you not bring children with you during ultrasound (echo/ vascular) testing. Due to room size and safety concerns, children are not allowed in the ultrasound rooms during exams. Our front office staff cannot provide observation of children in our lobby area while testing is being conducted. An adult accompanying a patient to their appointment will only be allowed in the ultrasound room at the discretion of the ultrasound technician under special circumstances. We apologize for any inconvenience.   Follow-Up: At [facility], you and your health needs are our priority.  As part of our continuing mission to provide you with exceptional heart care, our providers are all part of one team.  This team includes your primary Cardiologist (physician) and Advanced Practice Providers or APPs (Physician Assistants and Nurse Practitioners) who all work together to provide you with the care you need, when you need it.  Your next appointment:    3-4 months   Provider:   Osborne Jordan, MD    We  recommend signing up for the patient portal called MyChart.  Sign up information is provided on this After Visit Summary.  MyChart is used to connect with patients for Virtual Visits (Telemedicine).  Patients are able to view lab/test results, encounter notes, upcoming appointments, etc.  Non-urgent messages can be sent to your provider as well.   To learn more about what you can do with MyChart, go to [email or web address].

## 2024-05-03 NOTE — Progress Notes (Signed)
 "  Office Visit    Patient Name: Jeremy Holmes Date of Encounter: 05/03/2024  Primary Care Provider:  Frederik Charleston, MD Primary Cardiologist:  Keontre Jordan, MD  Chief Complaint    89 year old male with the above past medical history including CAD s/p NSTEMI, DES-LM, DES-proximal to mid LAD in 03/2024, ICM, hyperlipidemia, prediabetes, bladder cancer s/p BCG treatment, arthritis, and anxiety who presents for hospital follow-up related to CAD s/p DES x 2.  Past Medical History    Past Medical History:  Diagnosis Date   Anemia    Anxiety    no meds   Arthritis    right knee   Cancer (HCC)    skin: head,nose,back, bladder cancer   HLD (hyperlipidemia)    Pneumonia    Pre-diabetes    diet controlled - no meds   Past Surgical History:  Procedure Laterality Date   COLONOSCOPY     CORONARY BALLOON ANGIOPLASTY N/A 04/22/2024   Procedure: CORONARY BALLOON ANGIOPLASTY;  Surgeon: Wonda Sharper, MD;  Location: [facility] INVASIVE CV LAB;  Service: Cardiovascular;  Laterality: N/A;   CORONARY CTO INTERVENTION N/A 04/22/2024   Procedure: CORONARY CTO INTERVENTION;  Surgeon: Wonda Sharper, MD;  Location: [facility] INVASIVE CV LAB;  Service: Cardiovascular;  Laterality: N/A;   CORONARY STENT INTERVENTION W/IMPELLA N/A 04/22/2024   Procedure: Coronary Stent Intervention w/Impella;  Surgeon: Wonda Sharper, MD;  Location: [facility] INVASIVE CV LAB;  Service: Cardiovascular;  Laterality: N/A;   CORONARY ULTRASOUND/IVUS N/A 04/22/2024   Procedure: Coronary Ultrasound/IVUS;  Surgeon: Wonda Sharper, MD;  Location: [facility] INVASIVE CV LAB;  Service: Cardiovascular;  Laterality: N/A;   CYSTOSCOPY WITH BIOPSY N/A 01/31/2024   Procedure: CYSTOSCOPY, WITH BIOPSY;  Surgeon: Devere Lonni Righter, MD;  Location: [facility];  Service: Urology;  Laterality: N/A;   CYSTOSCOPY WITH FULGERATION N/A 01/31/2024   Procedure: CYSTOSCOPY, WITH BLADDER FULGURATION;  Surgeon: Devere Lonni Righter, MD;  Location: [facility];  Service:  Urology;  Laterality: N/A;   CYSTOSCOPY WITH URETEROSCOPY AND STENT PLACEMENT Right 01/31/2024   Procedure: CYSTOURETEROSCOPY, WITH STENT INSERTION;  Surgeon: Devere Lonni Righter, MD;  Location: [facility];  Service: Urology;  Laterality: Right;   CYSTOSCOPY WITH URETHRAL DILATATION N/A 01/31/2024   Procedure: CYSTOSCOPY, WITH URETHRAL DILATION;  Surgeon: Devere Lonni Righter, MD;  Location: [facility];  Service: Urology;  Laterality: N/A;   ELBOW SURGERY Right    EYE SURGERY Bilateral    cataracts surgery   INGUINAL HERNIA REPAIR Right 04/27/2021   Procedure: OPEN REPAIR RIGHT INGUINAL HERNIA WITH MESH;  Surgeon: Eletha Boas, MD;  Location: [facility] ;  Service: General;  Laterality: Right;   KNEE ARTHROSCOPY Right 08/20/2020   Procedure: ARTHROSCOPY KNEE AND IRRIGATION AND DEBRIDEMENT;  Surgeon: Jerri Kay HERO, MD;  Location: [facility] OR;  Service: Orthopedics;  Laterality: Right;   LEFT HEART CATH AND CORONARY ANGIOGRAPHY N/A 04/22/2024   Procedure: LEFT HEART CATH AND CORONARY ANGIOGRAPHY;  Surgeon: Wonda Sharper, MD;  Location: [facility] INVASIVE CV LAB;  Service: Cardiovascular;  Laterality: N/A;   SHOULDER SURGERY Bilateral    x 2   WISDOM TOOTH EXTRACTION      Allergies  Allergies[1]   Labs/Other Studies Reviewed    The following studies were reviewed today:  Cardiac Studies & Procedures   ______________________________________________________________________________________________ CARDIAC CATHETERIZATION  CARDIAC CATHETERIZATION 04/22/2024  Conclusion   Ost Cx to Prox Cx lesion is 40% stenosed.   3rd Mrg lesion is 80% stenosed.   1st Mrg lesion is 50% stenosed.   Prox  RCA to Mid RCA lesion is 40% stenosed.   Prox LAD to Mid LAD lesion is 99% stenosed.   Mid LM to Dist LM lesion is 75% stenosed.   A drug-eluting stent was successfully placed using a STENT ONYX FRONTIER 2.5X22.   A drug-eluting stent was successfully placed using a STENT ONYX FRONTIER 4.0X18.    Post intervention, there is a 0% residual stenosis.   Post intervention, there is a 0% residual stenosis.   There is mild left ventricular systolic dysfunction.   LV end diastolic pressure is normal.   The left ventricular ejection fraction is 50-55% by visual estimate.   Recommend uninterrupted dual antiplatelet therapy with Aspirin  81mg  daily and Ticagrelor  90mg  twice daily for a minimum of 12 months (ACS-Class I recommendation).  1.  Critical mid LAD stenosis of 99%, initially treated with balloon angioplasty complicated by abrupt closure and ST segment elevation, ultimately treated with orbital atherectomy and stenting using a 2.5 x 22 mm Onyx DES 2.  Severe distal left main stenosis of 75%, treated successfully with high speed orbital atherectomy and stenting using a 4.0 x 18 mm Onyx DES under intravascular ultrasound guidance 3.  Moderate proximal/ostial circumflex stenosis, appropriate for medical therapy with severe ostial stenosis of OM 3 also appropriate for medical therapy 4.  Nonobstructive RCA stenosis 5.  Mild global LV dysfunction with LVEF estimated at 50%, normal LVEDP  Recommendations: DAPT with aspirin  and ticagrelor  x 12 months, consider long-term P2 Y12 inhibition with left main stenting.  Findings Coronary Findings Diagnostic  Dominance: Right  Left Main Mid LM to Dist LM lesion is 75% stenosed. Ultrasound (IVUS) was performed. IVUS findings:  Mid LAD stent is well-expanded and apposed throughout with no evidence of proximal or distal edge dissection Proximal LAD landing zone for left main stent is 4.0 mm and left main landing zone is 4.5 mm in diameter Following left main stenting and postdilatation, there is an area of underexpansion of the proximal stent prompting redilatation with a 4.5 mm noncompliant balloon IVUS demonstrates wide patency of the ostial circumflex on LAD to left main pullback  Left Anterior Descending Prox LAD to Mid LAD lesion is 99%  stenosed.  Left Circumflex Ost Cx to Prox Cx lesion is 40% stenosed.  First Obtuse Marginal Branch 1st Mrg lesion is 50% stenosed.  Third Obtuse Marginal Branch 3rd Mrg lesion is 80% stenosed.  Right Coronary Artery There is mild diffuse disease throughout the vessel. Prox RCA to Mid RCA lesion is 40% stenosed.  Intervention  Mid LM to Dist LM lesion Stent A drug-eluting stent was successfully placed using a STENT ONYX FRONTIER 4.0X18. Post-Intervention Lesion Assessment The intervention was successful. Pre-interventional TIMI flow is 3. Post-intervention TIMI flow is 3. No complications occurred at this lesion. There is a 0% residual stenosis post intervention.  Prox LAD to Mid LAD lesion Stent A drug-eluting stent was successfully placed using a STENT ONYX FRONTIER 2.5X22. Post-stent angioplasty was performed using a BALLOON SAPPHIRE NC24 [id number]. Post-Intervention Lesion Assessment The intervention was successful. Pre-interventional TIMI flow is 2. Post-intervention TIMI flow is 3. No complications occurred at this lesion. There is a 0% residual stenosis post intervention.     ECHOCARDIOGRAM  ECHOCARDIOGRAM COMPLETE 04/21/2024  Narrative ECHOCARDIOGRAM REPORT    Patient Name:   Jeremy Holmes [facility] Date of Exam: 04/21/2024 Medical Rec #:  [id number]      Height:       68.0 in Accession #:    [id number]  Weight:       127.4 lb Date of Birth:  1934-02-25      BSA:          1.687 m Patient Age:    89 years       BP:           169/68 mmHg Patient Gender: M              HR:           72 bpm. Exam Location:  Inpatient  Procedure: 2D Echo, Cardiac Doppler and Color Doppler (Both Spectral and Color Flow Doppler were utilized during procedure).  Indications:    NSTEMI I21.4  History:        Patient has no prior history of Echocardiogram examinations. Signs/Symptoms:Hypertensive Heart Disease.  Sonographer:    Nathanel Devonshire Referring Phys: [id number] BELAL A  SULEIMAN   Sonographer Comments: Suboptimal parasternal window. IMPRESSIONS   1. Apical wall motion abnormalities are consistent with stress cardiomyopathy or LAD artery territory ischemia, but there is alsomild hypokinesis in the right coronary artery distribution. Left ventricular ejection fraction, by estimation, is 40 to 45%. The left ventricle has mildly decreased function. The left ventricle demonstrates regional wall motion abnormalities (see scoring diagram/findings for description). Left ventricular diastolic parameters are consistent with Grade I diastolic dysfunction (impaired relaxation). There is mild hypokinesis of the left ventricular, basal-mid inferolateral wall. There is moderate hypokinesis of the left ventricular, apical anterior wall and apical segment. 2. Right ventricular systolic function is normal. The right ventricular size is normal. Tricuspid regurgitation signal is inadequate for assessing PA pressure. 3. The mitral valve is normal in structure. Trivial mitral valve regurgitation. 4. The aortic valve was not well visualized. There is moderate calcification of the aortic valve. Aortic valve regurgitation is mild. Aortic valve sclerosis/calcification is present, without any evidence of aortic stenosis.  FINDINGS Left Ventricle: Apical wall motion abnormalities are consistent with stress cardiomyopathy or LAD artery territory ischemia, but there is alsomild hypokinesis in the right coronary artery distribution. Left ventricular ejection fraction, by estimation, is 40 to 45%. The left ventricle has mildly decreased function. The left ventricle demonstrates regional wall motion abnormalities. Mild hypokinesis of the left ventricular, basal-mid inferolateral wall. Moderate hypokinesis of the left ventricular, apical anterior wall and apical segment. Mild hypokinesis of the left ventricular, basal inferior wall and inferoseptal wall. The left ventricular internal cavity size  was normal in size. There is no left ventricular hypertrophy. Left ventricular diastolic parameters are consistent with Grade I diastolic dysfunction (impaired relaxation). Normal left ventricular filling pressure.   LV Wall Scoring: The posterior wall, apical septal segment, apical anterior segment, basal inferior segment, basal inferoseptal segment, and apex are hypokinetic. The anterior wall, antero-lateral wall, anterior septum, mid and distal inferior wall, apical lateral segment, and mid inferoseptal segment are normal.  Right Ventricle: The right ventricular size is normal. No increase in right ventricular wall thickness. Right ventricular systolic function is normal. Tricuspid regurgitation signal is inadequate for assessing PA pressure.  Left Atrium: Left atrial size was normal in size.  Right Atrium: Right atrial size was normal in size.  Pericardium: There is no evidence of pericardial effusion.  Mitral Valve: The mitral valve is normal in structure. Trivial mitral valve regurgitation.  Tricuspid Valve: The tricuspid valve is normal in structure. Tricuspid valve regurgitation is not demonstrated.  Aortic Valve: The aortic valve was not well visualized. There is moderate calcification of the aortic valve. Aortic valve regurgitation is mild.  Aortic regurgitation PHT measures 135 msec. Aortic valve sclerosis/calcification is present, without any evidence of aortic stenosis. Aortic valve mean gradient measures 2.0 mmHg. Aortic valve peak gradient measures 4.2 mmHg. Aortic valve area, by VTI measures 3.08 cm.  Pulmonic Valve: The pulmonic valve was not well visualized. Pulmonic valve regurgitation is not visualized. No evidence of pulmonic stenosis.  Aorta: The aortic root and ascending aorta are structurally normal, with no evidence of dilitation.  IAS/Shunts: No atrial level shunt detected by color flow Doppler.   LEFT VENTRICLE PLAX 2D LVIDd:         4.60 cm      Diastology LVIDs:         3.30 cm     LV e' medial:    5.66 cm/s LV PW:         0.80 cm     LV E/e' medial:  8.2 LV IVS:        0.90 cm     LV e' lateral:   4.57 cm/s LVOT diam:     2.15 cm     LV E/e' lateral: 10.2 LV SV:         57 LV SV Index:   34 LVOT Area:     3.63 cm LV IVRT:       148 msec  LV Volumes (MOD) LV vol d, MOD A2C: 68.9 ml LV vol d, MOD A4C: 62.8 ml LV vol s, MOD A2C: 30.9 ml LV vol s, MOD A4C: 28.7 ml LV SV MOD A2C:     38.0 ml LV SV MOD A4C:     62.8 ml LV SV MOD BP:      34.6 ml  RIGHT VENTRICLE          IVC RV Basal diam:  2.61 cm  IVC diam: 1.44 cm TAPSE (M-mode): 1.9 cm  LEFT ATRIUM             Index        RIGHT ATRIUM          Index LA diam:        3.36 cm 1.99 cm/m   RA Area:     7.30 cm LA Vol (A2C):   28.0 ml 16.59 ml/m  RA Volume:   13.20 ml 7.82 ml/m LA Vol (A4C):   15.5 ml 9.19 ml/m LA Biplane Vol: 23.1 ml 13.69 ml/m AORTIC VALVE                    PULMONIC VALVE AV Area (Vmax):    3.44 cm     PV Vmax:       0.97 m/s AV Area (Vmean):   3.33 cm     PV Peak grad:  3.8 mmHg AV Area (VTI):     3.08 cm AV Vmax:           102.00 cm/s AV Vmean:          64.900 cm/s AV VTI:            0.184 m AV Peak Grad:      4.2 mmHg AV Mean Grad:      2.0 mmHg LVOT Vmax:         96.60 cm/s LVOT Vmean:        59.500 cm/s LVOT VTI:          0.156 m LVOT/AV VTI ratio: 0.85 AI PHT:            135 msec  AORTA  Ao Root diam: 2.95 cm Ao Asc diam:  3.06 cm  MITRAL VALVE MV Area (PHT): 3.72 cm    SHUNTS MV Decel Time: 204 msec    Systemic VTI:  0.16 m MV E velocity: 46.60 cm/s  Systemic Diam: 2.15 cm MV A velocity: 86.50 cm/s MV E/A ratio:  0.54  Mihai Croitoru MD Electronically signed by Jerel Balding MD Signature Date/Time: 04/21/2024/11:52:43 AM    Final          ______________________________________________________________________________________________     Recent Labs: 10/30/2023: ALT 12 04/23/2024: BUN 28; Creatinine, Ser 0.88;  Potassium 4.0; Sodium 136 04/24/2024: Hemoglobin 11.5; Platelets 320  Recent Lipid Panel    Component Value Date/Time   CHOL 117 04/20/2024 0506   TRIG 66 04/20/2024 0506   HDL 44 04/20/2024 0506   CHOLHDL 2.7 04/20/2024 0506   VLDL 13 04/20/2024 0506   LDLCALC 60 04/20/2024 0506    History of Present Illness    89 year old male with the above past medical history including CAD s/p NSTEMI, DES-LM, DES-proximal to mid LAD in 03/2024, ICM, hyperlipidemia, prediabetes, bladder cancer s/p BCG treatment, arthritis, and anxiety.  He presented to the ED on 04/20/2024 in the setting of chest pain.  EKG showed T wave inversions in V2 through V4, troponin peaked at 191.  He underwent cardiac catheterization on 04/22/2024 in the setting of NSTEMI with successful orbital atherectomy, stenting of left main artery, and LAD.  He was noted to have residual 50% OM1 stenosis, 80% OM3 stenosis (small vessel), 40% proximal to mid RCA stenosis.  Echocardiogram showed EF 40 to 45%, RWMA, G1 DD, normal RV systolic function, mild aortic valve regurgitation, aortic valve sclerosis without evidence of aortic stenosis.  He did have recurrent chest pain post cath and was started on Imdur .  DAPT was recommended for at least 1 year, likely indefinitely given left main stenting.  He was started on metoprolol .  Lipitor was increased to 40 mg daily.  He was discharged home in stable condition.  He presents today for follow-up accompanied by his daughter.  Since his hospitalization He has been stable from a cardiac standpoint.  He is vaginally increasing his activity as tolerated.  He denies any symptoms concerning for angina.  He does have some bruising to his right radial cath site, in the healing stages.  Overall, he reports feeling well.    Home Medications    Current Outpatient Medications  Medication Sig Dispense Refill   acetaminophen  (TYLENOL ) 500 MG tablet Take 1,000 mg by mouth 2 (two) times daily as needed for mild  pain (pain score 1-3) or headache.     ascorbic acid (VITAMIN C) 1000 MG tablet Take 1,000 mg by mouth daily.     aspirin  EC 81 MG tablet Take 81 mg by mouth daily. Swallow whole.     atorvastatin  (LIPITOR) 40 MG tablet Take 1 tablet (40 mg total) by mouth daily. 90 tablet 1   ferrous sulfate 325 (65 FE) MG EC tablet Take 325 mg by mouth daily with breakfast.     isosorbide  mononitrate (IMDUR ) 30 MG 24 hr tablet Take 0.5 tablets (15 mg total) by mouth daily. 30 tablet 1   Lidocaine  HCl (ASPERCREME LIDOCAINE ) 4 % CREA Apply 1 application  topically daily as needed (muscle and joint pain). Back pain     metoprolol  tartrate (LOPRESSOR ) 25 MG tablet Take 1 tablet (25 mg total) by mouth 2 (two) times daily. 60 tablet 1   nitroGLYCERIN  (NITROSTAT ) 0.4 MG SL  tablet Place 1 tablet (0.4 mg total) under the tongue every 5 (five) minutes as needed for chest pain. 25 tablet 2   sulfamethoxazole-trimethoprim (BACTRIM DS) 800-160 MG tablet Take 1 tablet by mouth 2 (two) times daily.     tamsulosin (FLOMAX) 0.4 MG CAPS capsule Take 0.4 mg by mouth at bedtime.     ticagrelor  (BRILINTA ) 90 MG TABS tablet Take 1 tablet (90 mg total) by mouth 2 (two) times daily. 180 tablet 2   Cinnamon 500 MG capsule Take 1,000 mg by mouth daily. (Patient not taking: Reported on 05/03/2024)     Magnesium  250 MG TABS Take 250 mg by mouth daily. (Patient not taking: Reported on 05/03/2024)     Multiple Vitamins-Minerals (CENTRUM SILVER PO) Take 1 tablet by mouth daily. (Patient not taking: Reported on 05/03/2024)     Omega-3 Fatty Acids (FISH OIL) 1200 MG CAPS Take 1,200 mg by mouth daily. (Patient not taking: Reported on 05/03/2024)     No current facility-administered medications for this visit.     Review of Systems    He denies chest pain, palpitations, dyspnea, pnd, orthopnea, n, v, dizziness, syncope, edema, weight gain, or early satiety. All other systems reviewed and are otherwise negative except as noted above.     Cardiac  Rehabilitation Eligibility Assessment  The patient is ready to start cardiac rehabilitation from a cardiac standpoint.    Physical Exam    VS:  BP 118/60 (BP Location: Left Arm, Patient Position: Sitting, Cuff Size: Small)   Pulse 66   Ht 5' 8 (1.727 m)   Wt 125 lb (56.7 kg)   SpO2 97%   BMI 19.01 kg/m   GEN: Well nourished, well developed, in no acute distress. HEENT: normal. Neck: Supple, no JVD, carotid bruits, or masses. Cardiac: RRR, no murmurs, rubs, or gallops. No clubbing, cyanosis, edema.  Radials/DP/PT 2+ and equal bilaterally.  Right radial cath site with bruising in the healing stages, no bleeding or hematoma. Respiratory:  Respirations regular and unlabored, clear to auscultation bilaterally. GI: Soft, nontender, nondistended, BS + x 4. MS: no deformity or atrophy. Skin: warm and dry, no rash. Neuro:  Strength and sensation are intact. Psych: Normal affect.  Accessory Clinical Findings    ECG personally reviewed by me today - EKG Interpretation Date/Time:  Friday May 03 2024 14:28:13 EST Ventricular Rate:  66 PR Interval:  136 QRS Duration:  120 QT Interval:  404 QTC Calculation: 423 R Axis:   95  Text Interpretation: Normal sinus rhythm with sinus arrhythmia Right bundle branch block When compared with ECG of 23-Apr-2024 06:47, Nonspecific T wave abnormality no longer evident in Inferior leads T wave inversion more evident in Lateral leads QT has shortened Confirmed by Daneen Perkins ([id number]) on 05/03/2024 2:29:46 PM  - no acute changes.   Lab Results  Component Value Date   WBC 16.3 (H) 04/24/2024   HGB 11.5 (L) 04/24/2024   HCT 33.3 (L) 04/24/2024   MCV 93.8 04/24/2024   PLT 320 04/24/2024   Lab Results  Component Value Date   CREATININE 0.88 04/23/2024   BUN 28 (H) 04/23/2024   NA 136 04/23/2024   K 4.0 04/23/2024   CL 99 04/23/2024   CO2 23 04/23/2024   Lab Results  Component Value Date   ALT 12 10/30/2023   AST 16 10/30/2023   ALKPHOS 61  10/30/2023   BILITOT 0.3 10/30/2023   Lab Results  Component Value Date   CHOL 117 04/20/2024  HDL 44 04/20/2024   LDLCALC 60 04/20/2024   TRIG 66 04/20/2024   CHOLHDL 2.7 04/20/2024    No results found for: HGBA1C  Assessment & Plan    1. CAD: S/p NSTEMI with successful orbital atherectomy, stenting of left main artery, and LAD in 03/2024.  He was noted to have residual 50% OM1 stenosis, 80% OM3 stenosis (small vessel), 40% proximal to mid RCA stenosis. Stable with no anginal symptoms. No indication for ischemic evaluation.  Continue aspirin , Brilinta , metoprolol , Imdur , Lipitor.  2. ICM: Echocardiogram showed EF 40 to 45%, RWMA, G1 DD, normal RV systolic function, mild aortic valve regurgitation, aortic valve sclerosis without evidence of aortic stenosis.  Euvolemic and well compensated on exam. BP borderline in office today, will defer escalation of GDMT today.  Will repeat echocardiogram in 3 months, if LVEF remains reduced, consider escalation of GDMT.  Continue metoprolol .  3. Hyperlipidemia: Lipitor was recently increased to 40 mg daily.  Will repeat fasting lipids in 6 to 8 weeks.  Continue Lipitor.  4. Prediabetes: A1c was 6.4 in 01/2023.  Monitored and managed per PCP.  5. Disposition: Follow-up in 3 to 4 months.      Damien JAYSON Braver, NP 05/03/2024, 2:32 PM       [1] No Known Allergies  "

## 2024-07-31 ENCOUNTER — Ambulatory Visit (HOSPITAL_COMMUNITY)

## 2024-09-02 ENCOUNTER — Ambulatory Visit: Admitting: Cardiology
